# Patient Record
Sex: Male | Born: 1965 | Race: Black or African American | Hispanic: No | Marital: Married | Smoking: Never smoker
Health system: Southern US, Community
[De-identification: ages and names within clinical notes are randomized; demographics above are authoritative.]

## PROBLEM LIST (undated history)

## (undated) DIAGNOSIS — G609 Hereditary and idiopathic neuropathy, unspecified: Principal | ICD-10-CM

## (undated) DIAGNOSIS — M21619 Bunion of unspecified foot: Secondary | ICD-10-CM

## (undated) DIAGNOSIS — R351 Nocturia: Secondary | ICD-10-CM

## (undated) DIAGNOSIS — C61 Malignant neoplasm of prostate: Secondary | ICD-10-CM

## (undated) DIAGNOSIS — D860 Sarcoidosis of lung: Secondary | ICD-10-CM

## (undated) DIAGNOSIS — Z9189 Other specified personal risk factors, not elsewhere classified: Secondary | ICD-10-CM

## (undated) DIAGNOSIS — I10 Essential (primary) hypertension: Secondary | ICD-10-CM

## (undated) DIAGNOSIS — E785 Hyperlipidemia, unspecified: Secondary | ICD-10-CM

## (undated) HISTORY — DX: Malignant neoplasm of prostate: C61

## (undated) HISTORY — DX: Essential (primary) hypertension: I10

## (undated) HISTORY — PX: INGUINAL HERNIA REPAIR: SUR1180

## (undated) HISTORY — DX: Hyperlipidemia, unspecified: E78.5

## (undated) HISTORY — PX: FOOT SURGERY: SHX648

## (undated) HISTORY — PX: BUNIONECTOMY: SHX129

## (undated) HISTORY — DX: Hereditary and idiopathic neuropathy, unspecified: G60.9

---

## 1988-11-01 HISTORY — PX: BRONCHOSCOPY: SUR163

## 1999-01-21 ENCOUNTER — Encounter: Admission: RE | Admit: 1999-01-21 | Discharge: 1999-01-21 | Payer: Self-pay | Admitting: Family Medicine

## 1999-01-21 ENCOUNTER — Encounter: Payer: Self-pay | Admitting: Family Medicine

## 1999-10-04 ENCOUNTER — Emergency Department (HOSPITAL_COMMUNITY): Admission: EM | Admit: 1999-10-04 | Discharge: 1999-10-04 | Payer: Self-pay | Admitting: Emergency Medicine

## 2003-02-09 ENCOUNTER — Encounter: Admission: RE | Admit: 2003-02-09 | Discharge: 2003-02-09 | Payer: Self-pay | Admitting: Family Medicine

## 2003-12-09 ENCOUNTER — Emergency Department (HOSPITAL_COMMUNITY): Admission: EM | Admit: 2003-12-09 | Discharge: 2003-12-09 | Payer: Self-pay | Admitting: Emergency Medicine

## 2005-04-28 ENCOUNTER — Encounter: Admission: RE | Admit: 2005-04-28 | Discharge: 2005-04-28 | Payer: Self-pay | Admitting: Family Medicine

## 2010-10-09 ENCOUNTER — Ambulatory Visit
Admission: RE | Admit: 2010-10-09 | Discharge: 2010-10-09 | Disposition: A | Discharge status: Home / Self Care | Payer: 59 | Source: Ambulatory Visit | Attending: Family Medicine | Admitting: Family Medicine

## 2010-10-09 ENCOUNTER — Other Ambulatory Visit: Payer: Self-pay | Admitting: Family Medicine

## 2010-10-09 DIAGNOSIS — R200 Anesthesia of skin: Secondary | ICD-10-CM

## 2010-10-09 DIAGNOSIS — M545 Low back pain, unspecified: Secondary | ICD-10-CM

## 2010-10-24 ENCOUNTER — Other Ambulatory Visit: Payer: Self-pay | Admitting: Family Medicine

## 2010-10-24 DIAGNOSIS — M545 Low back pain, unspecified: Secondary | ICD-10-CM

## 2010-10-29 ENCOUNTER — Ambulatory Visit
Admission: RE | Admit: 2010-10-29 | Discharge: 2010-10-29 | Disposition: A | Discharge status: Home / Self Care | Payer: 59 | Source: Ambulatory Visit | Attending: Family Medicine | Admitting: Family Medicine

## 2010-10-29 DIAGNOSIS — M545 Low back pain, unspecified: Secondary | ICD-10-CM

## 2010-11-10 ENCOUNTER — Other Ambulatory Visit: Payer: Self-pay | Admitting: Family Medicine

## 2010-11-10 DIAGNOSIS — M48061 Spinal stenosis, lumbar region without neurogenic claudication: Secondary | ICD-10-CM

## 2010-11-12 ENCOUNTER — Ambulatory Visit
Admission: RE | Admit: 2010-11-12 | Discharge: 2010-11-12 | Disposition: A | Discharge status: Home / Self Care | Payer: 59 | Source: Ambulatory Visit | Attending: Family Medicine | Admitting: Family Medicine

## 2010-11-12 ENCOUNTER — Other Ambulatory Visit: Payer: Self-pay | Admitting: Family Medicine

## 2010-11-12 DIAGNOSIS — M48061 Spinal stenosis, lumbar region without neurogenic claudication: Secondary | ICD-10-CM

## 2010-11-12 MED ORDER — METHYLPREDNISOLONE ACETATE 40 MG/ML INJ SUSP (RADIOLOG
120.0000 mg | Freq: Once | INTRAMUSCULAR | Status: AC
  Administered 2010-11-12: 120 mg via EPIDURAL

## 2010-11-12 MED ORDER — IOHEXOL 180 MG/ML  SOLN
1.0000 mL | Freq: Once | INTRAMUSCULAR | Status: AC | PRN
  Administered 2010-11-12: 1 mL via EPIDURAL

## 2011-04-21 ENCOUNTER — Other Ambulatory Visit (HOSPITAL_COMMUNITY): Payer: Self-pay | Admitting: Neurosurgery

## 2011-04-21 ENCOUNTER — Other Ambulatory Visit: Payer: Self-pay | Admitting: Neurosurgery

## 2011-04-21 DIAGNOSIS — M5416 Radiculopathy, lumbar region: Secondary | ICD-10-CM

## 2011-05-14 ENCOUNTER — Ambulatory Visit (HOSPITAL_COMMUNITY)
Admission: RE | Admit: 2011-05-14 | Discharge: 2011-05-14 | Disposition: A | Discharge status: Home / Self Care | Payer: 59 | Source: Ambulatory Visit | Attending: Neurosurgery | Admitting: Neurosurgery

## 2011-05-14 ENCOUNTER — Other Ambulatory Visit (HOSPITAL_COMMUNITY): Payer: 59

## 2011-05-14 DIAGNOSIS — M5126 Other intervertebral disc displacement, lumbar region: Secondary | ICD-10-CM | POA: Insufficient documentation

## 2011-05-14 DIAGNOSIS — M5416 Radiculopathy, lumbar region: Secondary | ICD-10-CM

## 2011-05-14 MED ORDER — OXYCODONE-ACETAMINOPHEN 5-325 MG PO TABS: 1.0000 | ORAL_TABLET | ORAL | Status: DC | PRN

## 2011-05-14 MED ORDER — IOHEXOL 180 MG/ML  SOLN
20.0000 mL | Freq: Once | INTRAMUSCULAR | Status: AC | PRN
  Administered 2011-05-14: 20 mL via INTRATHECAL

## 2011-05-14 MED ORDER — DIAZEPAM 5 MG PO TABS
ORAL_TABLET | ORAL | Status: AC
  Filled 2011-05-14: qty 2

## 2011-05-14 MED ORDER — DIAZEPAM 5 MG PO TABS
10.0000 mg | ORAL_TABLET | Freq: Once | ORAL | Status: AC
  Administered 2011-05-14: 10 mg via ORAL

## 2011-05-14 MED ORDER — ONDANSETRON HCL 4 MG/2ML IJ SOLN: 4.0000 mg | Freq: Four times a day (QID) | INTRAMUSCULAR | Status: DC | PRN

## 2011-05-14 NOTE — Discharge Instructions (Signed)
Myelogram and Lumbar Puncture Discharge Instructions  1. Go home and rest quietly for the next 24 hours.  It is important to lie flat for the next 24 hours.  Get up only to go to the restroom.  You may lie in the bed or on a couch on your back, your stomach, your left side or your right side.  You may have one pillow under your head.  You may have pillows between your knees while you are on your side or under your knees while you are on your back.  2. DO NOT drive today.  Recline the seat as far back as it will go, while still wearing your seat belt, on the way home.  3. You may get up to go to the bathroom as needed.  You may sit up for 10 minutes to eat.  You may resume your normal diet and medications unless otherwise indicated.  4. The incidence of headache, nausea, or vomiting is about 5% (one in 20 patients).  If you develop a headache, lie flat and drink plenty of fluids until the headache goes away.  Caffeinated beverages may be helpful.  If you develop severe nausea and vomiting or a headache that does not go away with flat bed rest, call MD.  5. You may resume normal activities after your 24 hours of bed rest is over; however, do not exert yourself strongly or do any heavy lifting tomorrow.  6. Call your physician for a follow-up appointment.  The results of your myelogram will be sent directly to your physician by the following day.  7. If you have any questions or if complications develop after you arrive home, please call MD.  Discharge instructions have been explained to the patient.  The patient, or the person responsible for the patient, fully understands these instructions.   

## 2011-05-14 NOTE — H&P (Signed)
Martin Murphy is an 46 y.o. male.   Chief Complaint: back and right lower extremity pain HPI: back and right lower extremity pain.  No past medical history on file.  No past surgical history on file.  No family history on file. Social History:  does not have a smoking history on file. He does not have any smokeless tobacco history on file. His alcohol and drug histories not on file.  Allergies: No Known Allergies  No current outpatient prescriptions on file as of 05/14/2011.   Medications Prior to Admission  Medication Dose Route Frequency Provider Last Rate Last Dose  . diazepam (VALIUM) 5 MG tablet           . diazepam (VALIUM) tablet 10 mg  10 mg Oral Once Carmela Hurt, MD   10 mg at 05/14/11 0707    No results found for this or any previous visit (from the past 48 hour(s)). No results found.  ROS  Blood pressure 137/84, pulse 78, temperature 96.8 F (36 C), temperature source Oral, resp. rate 18, height 5\' 11"  (1.803 m), weight 92.08 kg (203 lb), SpO2 96.00%. Physical Exam Alert and oriented x4 5/5 all extremities  Assessment/Plan Myelogram for evaluation of pain  Johan Creveling L 05/14/2011, 8:35 AM

## 2011-05-14 NOTE — Op Note (Signed)
*   No surgery found *  8:45 AM  PATIENT:  Martin Murphy  46 y.o. malewith back and right lower extremity pain  PRE-OPERATIVE DIAGNOSIS:  Right lower extremity pain  POST-OPERATIVE DIAGNOSIS:  Right lower extremity pain  PROCEDURE:  Lumbar myelogram  SURGEON:Ravenne Wayment    DICTATION: [name] back was prepped and draped in a sterile manner. I infiltrated lidocaine into the lumbar area. I placed a spinal needle into the thecal sac between L3/4. I infiltrated 200 180 omnipaque into the thecal sac. Xray showed the dye to be in the thecal sac.   PLAN OF CARE: Discharge to home after PACU  PATIENT DISPOSITION:  PACU - hemodynamically stable.   Delay start of Pharmacological VTE agent (>24hrs) due to surgical blood loss or risk of bleeding:  yes

## 2011-05-16 ENCOUNTER — Telehealth (HOSPITAL_COMMUNITY): Payer: Self-pay

## 2011-05-16 ENCOUNTER — Other Ambulatory Visit: Payer: Self-pay | Admitting: Neurosurgery

## 2011-05-16 ENCOUNTER — Ambulatory Visit
Admission: RE | Admit: 2011-05-16 | Discharge: 2011-05-16 | Disposition: A | Discharge status: Home / Self Care | Payer: 59 | Source: Ambulatory Visit | Attending: Neurosurgery | Admitting: Neurosurgery

## 2011-05-16 MED ORDER — IOHEXOL 180 MG/ML  SOLN
1.0000 mL | Freq: Once | INTRAMUSCULAR | Status: AC | PRN
  Administered 2011-05-16: 1 mL via EPIDURAL

## 2011-05-16 NOTE — Progress Notes (Signed)
20 cc of blood drawn for the blood patch, from pt's left cephalic vein.  Site unremarkable and pt tolerated procedure well.

## 2011-05-16 NOTE — Discharge Instructions (Signed)
Blood Patch Discharge Instructions  1. Go home and rest quietly for the next 24 hours.  It is important to lie flat for the next 24 hours.  Get up only to go to the restroom.  You may lie in the bed or on a couch on your back, your stomach, your left side or your right side.  You may have one pillow under your head.  You may have pillows between your knees while you are on your side or under your knees while you are on your back.  2. DO NOT drive today.  Recline the seat as far back as it will go, while still wearing your seat belt, on the way home.  3. You may get up to go to the bathroom as needed.  You may sit up for 10 minutes to eat.  You may resume your normal diet and medications unless otherwise indicated.  Drink lots of extra fluids today and tomorrow.  4. The incidence of headache, nausea, or vomiting is about 5% (one in 20 patients).  If you develop a headache, lie flat and drink plenty of fluids until the headache goes away.  Caffeinated beverages may be helpful.  If you develop severe nausea and vomiting or a headache that does not go away with flat bed rest, call [phone].  5. You may resume normal activities after your 24 hours of bed rest is over; however, do not exert yourself strongly or do any heavy lifting tomorrow.  6. Call your physician for a follow-up appointment.   7. If you have any questions or if complications develop after you arrive home, please call [phone].  Discharge instructions have been explained to the patient.  The patient, or the person responsible for the patient, fully understands these instructions.  

## 2012-01-01 ENCOUNTER — Encounter (INDEPENDENT_AMBULATORY_CARE_PROVIDER_SITE_OTHER): Payer: Self-pay

## 2012-01-12 ENCOUNTER — Encounter (INDEPENDENT_AMBULATORY_CARE_PROVIDER_SITE_OTHER): Payer: Self-pay | Admitting: General Surgery

## 2012-01-15 ENCOUNTER — Encounter (INDEPENDENT_AMBULATORY_CARE_PROVIDER_SITE_OTHER): Payer: Self-pay | Admitting: General Surgery

## 2012-01-15 ENCOUNTER — Ambulatory Visit (INDEPENDENT_AMBULATORY_CARE_PROVIDER_SITE_OTHER): Payer: 59 | Admitting: General Surgery

## 2012-01-15 VITALS — BP 123/79 | HR 82 | Temp 98.6°F | Resp 16 | Ht 71.0 in | Wt 206.2 lb

## 2012-01-15 DIAGNOSIS — K4090 Unilateral inguinal hernia, without obstruction or gangrene, not specified as recurrent: Secondary | ICD-10-CM

## 2012-01-15 NOTE — Progress Notes (Signed)
Subjective:     Patient ID: Martin Murphy, male   DOB: 1966-02-16, 46 y.o.   MRN: [id number]  HPI The patient is a 46 year old male with a several week history of a right inguinal hernia. Patient had increased burning sensation and a bulge that he can feel after being on his feet for a long period of time.  Patient's had previous lifting or hernia repair in the 80s.  Review of Systems  Constitutional: Negative.   HENT: Negative.   Eyes: Negative.   Respiratory: Negative.   Cardiovascular: Negative.   Gastrointestinal: Negative.   Musculoskeletal: Negative.   Neurological: Negative.        Objective:   Physical Exam  Constitutional: He is oriented to person, place, and time. He appears well-developed and well-nourished.  HENT:  Head: Normocephalic and atraumatic.  Eyes: Conjunctivae normal and EOM are normal. Pupils are equal, round, and reactive to light.  Neck: Normal range of motion. Neck supple.  Cardiovascular: Normal rate and regular rhythm.   Pulmonary/Chest: Effort normal and breath sounds normal.  Abdominal: Soft. Bowel sounds are normal. A hernia is present. Hernia confirmed positive in the right inguinal area.  Neurological: He is alert and oriented to person, place, and time. He has normal reflexes.       Assessment:     The patient is a 46 year old male with a right inguinal hernia    Plan:     1. We'll proceed to the operating room for laparoscopic right inguinal hernia repair with mesh.  2.All risks and benefits were discussed with the patient, to generally include infection, bleeding, damage to surrounding structures, and recurrence. Alternatives were offered and described.  All questions were answered and the patient voiced understanding of the procedure and wishes to proceed at this point.

## 2012-02-09 DIAGNOSIS — K4090 Unilateral inguinal hernia, without obstruction or gangrene, not specified as recurrent: Secondary | ICD-10-CM

## 2012-02-24 ENCOUNTER — Encounter (INDEPENDENT_AMBULATORY_CARE_PROVIDER_SITE_OTHER): Payer: 59 | Admitting: General Surgery

## 2012-02-24 ENCOUNTER — Ambulatory Visit (INDEPENDENT_AMBULATORY_CARE_PROVIDER_SITE_OTHER): Payer: 59 | Admitting: General Surgery

## 2012-02-24 ENCOUNTER — Encounter (INDEPENDENT_AMBULATORY_CARE_PROVIDER_SITE_OTHER): Payer: Self-pay | Admitting: General Surgery

## 2012-02-24 VITALS — BP 132/84 | HR 76 | Temp 97.8°F | Resp 16 | Ht 71.0 in | Wt 199.5 lb

## 2012-02-24 DIAGNOSIS — Z9889 Other specified postprocedural states: Secondary | ICD-10-CM

## 2012-02-24 DIAGNOSIS — Z8719 Personal history of other diseases of the digestive system: Secondary | ICD-10-CM

## 2012-02-24 NOTE — Progress Notes (Signed)
Patient ID: Martin Murphy, male   DOB: 05/13/1965, 46 y.o.   MRN: [id number] Pt doing well no complaints today.  Some scrotal edema.  O/E Wound c/d/i No hernia on palp  A/p: F/u x 1 mo No heavy lifting,.

## 2012-02-26 ENCOUNTER — Encounter (INDEPENDENT_AMBULATORY_CARE_PROVIDER_SITE_OTHER): Payer: 59 | Admitting: General Surgery

## 2012-03-24 ENCOUNTER — Ambulatory Visit (INDEPENDENT_AMBULATORY_CARE_PROVIDER_SITE_OTHER): Payer: 59 | Admitting: General Surgery

## 2012-03-24 ENCOUNTER — Encounter (INDEPENDENT_AMBULATORY_CARE_PROVIDER_SITE_OTHER): Payer: Self-pay | Admitting: General Surgery

## 2012-03-24 VITALS — BP 138/80 | HR 80 | Resp 16 | Ht 71.0 in | Wt 204.2 lb

## 2012-03-24 DIAGNOSIS — Z9889 Other specified postprocedural states: Secondary | ICD-10-CM

## 2012-03-24 DIAGNOSIS — Z8719 Personal history of other diseases of the digestive system: Secondary | ICD-10-CM

## 2012-03-24 NOTE — Progress Notes (Signed)
Patient ID: Martin Murphy, male   DOB: 06/12/1965, 47 y.o.   MRN: [id number] Patient is a 47 year old male status post laparoscopic right inguinal hernia repair with mesh. Patient has been doing well postoperatively. His previous edema in his right scrotal area has slowly resolving.  On exam: No hernia that can be  palpate. Scrotal edema his results.  Assessment and plan: 1. There is to return to work at full duty.  2. Is to followup when necessary

## 2012-03-29 ENCOUNTER — Encounter (INDEPENDENT_AMBULATORY_CARE_PROVIDER_SITE_OTHER): Payer: Self-pay | Admitting: General Surgery

## 2012-03-29 ENCOUNTER — Telehealth (INDEPENDENT_AMBULATORY_CARE_PROVIDER_SITE_OTHER): Payer: Self-pay

## 2012-03-29 ENCOUNTER — Telehealth (INDEPENDENT_AMBULATORY_CARE_PROVIDER_SITE_OTHER): Payer: Self-pay | Admitting: General Surgery

## 2012-03-29 NOTE — Telephone Encounter (Signed)
Faxed RTW note for full duty as of 03/29/12 to [phone] per req.Marland Kitchen

## 2012-03-29 NOTE — Telephone Encounter (Signed)
The pt's employer called and they need a note for him to return to work full duty with the date on it.  Please fax to [phone]

## 2012-07-01 ENCOUNTER — Other Ambulatory Visit: Payer: Self-pay | Admitting: Family Medicine

## 2012-07-06 ENCOUNTER — Ambulatory Visit
Admission: RE | Admit: 2012-07-06 | Discharge: 2012-07-06 | Disposition: A | Discharge status: Home / Self Care | Payer: 59 | Source: Ambulatory Visit | Attending: Family Medicine | Admitting: Family Medicine

## 2012-12-20 HISTORY — PX: PROSTATE BIOPSY: SHX241

## 2013-05-19 ENCOUNTER — Telehealth: Payer: Self-pay | Admitting: Internal Medicine

## 2013-05-19 NOTE — Telephone Encounter (Signed)
S/W PATIENT AND GAVE NEW PATIENT APPT FOR 03/31 @ 1:30 W/DR. MOHAMED.  [place] PACKET MAILED.

## 2013-05-20 ENCOUNTER — Telehealth: Payer: Self-pay | Admitting: Internal Medicine

## 2013-05-20 NOTE — Telephone Encounter (Signed)
C/D 05/20/13 for appt. 05/31/13

## 2013-05-31 ENCOUNTER — Ambulatory Visit: Payer: 59

## 2013-05-31 ENCOUNTER — Encounter: Payer: Self-pay | Admitting: Internal Medicine

## 2013-05-31 ENCOUNTER — Telehealth: Payer: Self-pay | Admitting: Internal Medicine

## 2013-05-31 ENCOUNTER — Other Ambulatory Visit (HOSPITAL_BASED_OUTPATIENT_CLINIC_OR_DEPARTMENT_OTHER): Payer: 59

## 2013-05-31 ENCOUNTER — Ambulatory Visit (HOSPITAL_BASED_OUTPATIENT_CLINIC_OR_DEPARTMENT_OTHER): Payer: 59 | Admitting: Internal Medicine

## 2013-05-31 VITALS — BP 137/80 | HR 88 | Temp 98.4°F | Resp 20 | Ht 71.0 in | Wt 198.8 lb

## 2013-05-31 DIAGNOSIS — D472 Monoclonal gammopathy: Secondary | ICD-10-CM

## 2013-05-31 DIAGNOSIS — Z8546 Personal history of malignant neoplasm of prostate: Secondary | ICD-10-CM

## 2013-05-31 DIAGNOSIS — M79609 Pain in unspecified limb: Secondary | ICD-10-CM

## 2013-05-31 LAB — CBC WITH DIFFERENTIAL/PLATELET
BASO%: 0.3 % (ref 0.0–2.0)
Basophils Absolute: 0 10*3/uL (ref 0.0–0.1)
EOS%: 2.4 % (ref 0.0–7.0)
Eosinophils Absolute: 0.1 10*3/uL (ref 0.0–0.5)
HCT: 38.8 % (ref 38.4–49.9)
HGB: 12.7 g/dL — ABNORMAL LOW (ref 13.0–17.1)
LYMPH#: 1.1 10*3/uL (ref 0.9–3.3)
LYMPH%: 19.5 % (ref 14.0–49.0)
MCH: 27.5 pg (ref 27.2–33.4)
MCHC: 32.8 g/dL (ref 32.0–36.0)
MCV: 83.9 fL (ref 79.3–98.0)
MONO#: 0.5 10*3/uL (ref 0.1–0.9)
MONO%: 8.6 % (ref 0.0–14.0)
NEUT#: 3.8 10*3/uL (ref 1.5–6.5)
NEUT%: 69.2 % (ref 39.0–75.0)
Platelets: 230 10*3/uL (ref 140–400)
RBC: 4.62 10*6/uL (ref 4.20–5.82)
RDW: 14.4 % (ref 11.0–14.6)
WBC: 5.5 10*3/uL (ref 4.0–10.3)

## 2013-05-31 LAB — COMPREHENSIVE METABOLIC PANEL (CC13)
ALK PHOS: 280 U/L — AB (ref 40–150)
ALT: 60 U/L — AB (ref 0–55)
AST: 49 U/L — AB (ref 5–34)
Albumin: 3.4 g/dL — ABNORMAL LOW (ref 3.5–5.0)
Anion Gap: 12 mEq/L — ABNORMAL HIGH (ref 3–11)
BUN: 13.4 mg/dL (ref 7.0–26.0)
CALCIUM: 9.8 mg/dL (ref 8.4–10.4)
CHLORIDE: 101 meq/L (ref 98–109)
CO2: 26 mEq/L (ref 22–29)
CREATININE: 0.9 mg/dL (ref 0.7–1.3)
Glucose: 103 mg/dl (ref 70–140)
POTASSIUM: 3.4 meq/L — AB (ref 3.5–5.1)
Sodium: 139 mEq/L (ref 136–145)
Total Bilirubin: 0.45 mg/dL (ref 0.20–1.20)
Total Protein: 8.2 g/dL (ref 6.4–8.3)

## 2013-05-31 LAB — LACTATE DEHYDROGENASE (CC13): LDH: 141 U/L (ref 125–245)

## 2013-05-31 NOTE — Progress Notes (Signed)
Checked in new pt with no financial concerns. °

## 2013-05-31 NOTE — Progress Notes (Signed)
[facility] [phone]   Fax:([phone]) [phone]  CONSULT NOTE  REFERRING PHYSICIAN: Dr. Tobie Lords  REASON FOR CONSULTATION:  48 years old African American male with M spike  HPI Martin Murphy is a 48 y.o. male was past medical history significant for hypertension, prostate cancer diagnosed in October of 2014 as well as biopsy-proven sarcoidosis. The patient has been complaining of increasing pain in his legs and feet as well as lower back. He received a steroid injection in the back with no improvement in his condition. He was referred to Dr. Ouida Sills for evaluation of his condition. During his assessment serum protein electrophoreses was performed and showed M spike of 0.2 G/DL. Urine protein electrophoreses was also performed that showed no detectable M spike. Because of these abnormalities the patient was referred to me today for further evaluation and recommendation regarding the monoclonal gammopathy.  The patient is feeling fine today with no specific complaints except for the persistent aching pain in his feet. He also lost around 8 pounds over the last 6 months. He denied having any significant chest pain, shortness breath, cough or hemoptysis. He has no nausea or vomiting, no diarrhea or constipation and no bleeding issues. He was recently diagnosed with prostate adenocarcinoma in October of 2014 by Dr. Risa Grill after he was found to have rising PSA. He scheduled for repeat biopsy in the next few weeks. Family history significant for a mother with a bone marrow disorder and father died from natural causes at age 74. The patient is divorced and has one son. 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 He has no history of smoking, alcohol or drug abuse. HPI  Past Medical History  Diagnosis Date  . Hypertension   . Sarcoid   . Right inguinal hernia     Past Surgical History  Procedure Laterality Date  . Sarcoid      neck  .  Hernia repair  1993  . Hernia repair  02/09/12    right inguinal repair with mesh    History reviewed. No pertinent family history.  Social History History  Substance Use Topics  . Smoking status: Never Smoker   . Smokeless tobacco: Never Used  . Alcohol Use: No    No Known Allergies  Current Outpatient Prescriptions  Medication Sig Dispense Refill  . hydrochlorothiazide (HYDRODIURIL) 25 MG tablet Take 25 mg by mouth daily.      . Multiple Vitamin (MULTIVITAMIN) tablet Take 1 tablet by mouth 2 (two) times daily.       No current facility-administered medications for this visit.    Review of Systems  Constitutional: positive for fatigue and weight loss Eyes: negative Ears, nose, mouth, throat, and face: negative Respiratory: negative Cardiovascular: negative Gastrointestinal: negative Genitourinary:negative Integument/breast: negative Hematologic/lymphatic: negative Musculoskeletal:positive for myalgias Neurological: negative Behavioral/Psych: negative Endocrine: negative Allergic/Immunologic: negative  Physical Exam  KGO:VPCHE, healthy, no distress, well nourished and well developed SKIN: skin color, texture, turgor are normal, no rashes or significant lesions HEAD: Normocephalic, No masses, lesions, tenderness or abnormalities EYES: normal, PERRLA EARS: External ears normal, Canals clear OROPHARYNX:no exudate, no erythema and lips, buccal mucosa, and tongue normal  NECK: supple, no adenopathy, no JVD LYMPH:  no palpable lymphadenopathy, no hepatosplenomegaly LUNGS: clear to auscultation , and palpation HEART: regular rate & rhythm and no murmurs ABDOMEN:abdomen soft, non-tender, normal bowel sounds and no masses or organomegaly BACK: Back symmetric, no curvature., No CVA tenderness EXTREMITIES:no joint deformities, effusion, or inflammation,  no edema, no skin discoloration, no clubbing  NEURO: alert & oriented x 3 with fluent speech, no focal motor/sensory  deficits  PERFORMANCE STATUS: ECOG 1  LABORATORY DATA: No results found for this basename: WBC, HGB, HCT, MCV, PLT      Chemistry   No results found for this basename: NA, K, CL, CO2, BUN, CREATININE, GLU   No results found for this basename: CALCIUM, ALKPHOS, AST, ALT, BILITOT       RADIOGRAPHIC STUDIES: No results found.  ASSESSMENT: This is a very pleasant 48 years old African American male has been complaining of aching pain especially in the legs and feet and recent blood work showed slightly elevated M spike in the serum protein electrophoresis. This could be reactive in nature but I cannot rule out monoclonal gammopathy or plasma cell dyscrasia at this point.   PLAN:  I had a lengthy discussion with the patient today about his condition. I ordered severa monoclonal gammopathyl studies to evaluate his elevated M spike. I will repeat CBC, comprehensive metabolic panel, LDH as well as myeloma panel. This lab results are still pending. I would see the patient back for followup visit in 2 weeks for reevaluation and discussion of his lab results. If there is any concerning findings I would consider the patient for a bone marrow biopsy and aspirate for confirmation of his diagnosis. The patient is currently asymptomatic except for the aching pain. He will continue on observation for now.  he was advised to call immediately if he has any concerning symptoms in the interval.  The patient voices understanding of current disease status and treatment options and is in agreement with the current care plan.  All questions were answered. The patient knows to call the clinic with any problems, questions or concerns. We can certainly see the patient much sooner if necessary.  Thank you so much for allowing me to participate in the care of Martin Murphy. I will continue to follow up the patient with you and assist in his care.  I spent 35 minutes counseling the patient face to face. The total  time spent in the appointment was 55 minutes.  Disclaimer: This note was dictated with voice recognition software. Similar sounding words can inadvertently be transcribed and may not be corrected upon review.   Zanobia Griebel K. 05/31/2013, 2:49 PM

## 2013-05-31 NOTE — Telephone Encounter (Signed)
gve the pt his April 2105 appt calendar

## 2013-06-02 LAB — KAPPA/LAMBDA LIGHT CHAINS
KAPPA FREE LGHT CHN: 2.48 mg/dL — AB (ref 0.33–1.94)
KAPPA LAMBDA RATIO: 0.87 (ref 0.26–1.65)
Lambda Free Lght Chn: 2.84 mg/dL — ABNORMAL HIGH (ref 0.57–2.63)

## 2013-06-02 LAB — IGG, IGA, IGM
IGA: 705 mg/dL — AB (ref 68–379)
IGG (IMMUNOGLOBIN G), SERUM: 1600 mg/dL (ref 650–1600)
IgM, Serum: 30 mg/dL — ABNORMAL LOW (ref 41–251)

## 2013-06-02 LAB — BETA 2 MICROGLOBULIN, SERUM: BETA 2 MICROGLOBULIN: 3.61 mg/L — AB (ref <=2.51)

## 2013-06-07 HISTORY — PX: PROSTATE BIOPSY: SHX241

## 2013-06-20 ENCOUNTER — Telehealth: Payer: Self-pay | Admitting: Internal Medicine

## 2013-06-20 ENCOUNTER — Ambulatory Visit (HOSPITAL_BASED_OUTPATIENT_CLINIC_OR_DEPARTMENT_OTHER): Payer: 59 | Admitting: Internal Medicine

## 2013-06-20 ENCOUNTER — Encounter: Payer: Self-pay | Admitting: Internal Medicine

## 2013-06-20 DIAGNOSIS — D472 Monoclonal gammopathy: Secondary | ICD-10-CM

## 2013-06-20 NOTE — Telephone Encounter (Signed)
gv adn printed appt sched and avs for pt for OCT °

## 2013-06-20 NOTE — Progress Notes (Signed)
[facility] [phone]   Fax:(336) [phone]  OFFICE PROGRESS NOTE  Simona Huh, MD [address], [address]  [place] [address]  DIAGNOSIS: Monoclonal gammopathy of undetermined significance  PRIOR THERAPY: None  CURRENT THERAPY: Observation.   INTERVAL HISTORY: Martin Murphy 48 y.o. male returns to the clinic today for followup visit. The patient is feeling fine today with no specific complaints except for pain in his feet. He denied having any significant chest pain, shortness of breath, cough or hemoptysis. The patient denied having any nausea or vomiting. He has no significant weight loss or night sweats. He has several studies performed recently including myeloma panel and he is here for evaluation and discussion of his lab results.  MEDICAL HISTORY: Past Medical History  Diagnosis Date  . Hypertension   . Sarcoid   . Right inguinal hernia     ALLERGIES:  has No Known Allergies.  MEDICATIONS:  Current Outpatient Prescriptions  Medication Sig Dispense Refill  . hydrochlorothiazide (HYDRODIURIL) 25 MG tablet Take 25 mg by mouth daily.      . Multiple Vitamin (MULTIVITAMIN) tablet Take 1 tablet by mouth 2 (two) times daily.       No current facility-administered medications for this visit.    SURGICAL HISTORY:  Past Surgical History  Procedure Laterality Date  . Sarcoid      neck  . Hernia repair  1993  . Hernia repair  02/09/12    right inguinal repair with mesh    REVIEW OF SYSTEMS:  A comprehensive review of systems was negative except for: Pain in his feet   PHYSICAL EXAMINATION: General appearance: alert, cooperative and no distress Head: Normocephalic, without obvious abnormality, atraumatic Neck: no adenopathy, no JVD, supple, symmetrical, trachea midline and thyroid not enlarged, symmetric, no tenderness/mass/nodules Lymph nodes: Cervical, supraclavicular, and axillary nodes normal. Resp: clear to auscultation  bilaterally Back: symmetric, no curvature. ROM normal. No CVA tenderness. Cardio: regular rate and rhythm, S1, S2 normal, no murmur, click, rub or gallop GI: soft, non-tender; bowel sounds normal; no masses,  no organomegaly Extremities: extremities normal, atraumatic, no cyanosis or edema  ECOG PERFORMANCE STATUS: 0 - Asymptomatic  There were no vitals taken for this visit.  LABORATORY DATA: Lab Results  Component Value Date   WBC 5.5 05/31/2013   HGB 12.7* 05/31/2013   HCT 38.8 05/31/2013   MCV 83.9 05/31/2013   PLT 230 05/31/2013      Chemistry      Component Value Date/Time   NA 139 05/31/2013 1533   K 3.4* 05/31/2013 1533   CO2 26 05/31/2013 1533   BUN 13.4 05/31/2013 1533   CREATININE 0.9 05/31/2013 1533      Component Value Date/Time   CALCIUM 9.8 05/31/2013 1533   ALKPHOS 280* 05/31/2013 1533   AST 49* 05/31/2013 1533   ALT 60* 05/31/2013 1533   BILITOT 0.45 05/31/2013 1533     Other lab results: Beta-2 microglobulin 3.61, IgG 1600, IgA 705 and IgM 30. Kappa light chain 2.48, free lambda light chain 2.84 with a kappa/lambda ratio of 0.87.  RADIOGRAPHIC STUDIES: No results found.  ASSESSMENT AND PLAN: This is a very pleasant 48 years old [place] American male with minor elevation of IgA most likely inflammatory in origin but I cannot rule out monoclonal gammopathy of undetermined significance. I discussed the lab result with the patient today and give him the option of continuous observation and close monitoring versus proceeding with a bone  marrow biopsy and aspirate to rule out plasma cell neoplasm. He would like to continue on observation for now. I would see him back for followup visit in 6 months with repeat myeloma panel. He was advised to call immediately if he has any concerning symptoms in the interval. The patient voices understanding of current disease status and treatment options and is in agreement with the current care plan.  All questions were answered. The  patient knows to call the clinic with any problems, questions or concerns. We can certainly see the patient much sooner if necessary.  Disclaimer: This note was dictated with voice recognition software. Similar sounding words can inadvertently be transcribed and may not be corrected upon review.

## 2013-07-28 ENCOUNTER — Encounter: Payer: Self-pay | Admitting: *Deleted

## 2013-07-29 ENCOUNTER — Encounter: Payer: Self-pay | Admitting: Radiation Oncology

## 2013-07-29 NOTE — Progress Notes (Signed)
GU Location of Tumor / Histology: prostate  If Prostate Cancer, Gleason Score is (3 + 3) and PSA is (4.51 on 05/10/13)  Patient presented 7 months ago with signs/symptoms of: positive prostate biopsy secondary to increase in PSA from 1.0 to 2.8, strong family history of prostate cancer  Biopsies of prostate (if applicable) revealed: 03/09/38  12/20/12    Past/Anticipated interventions by urology, if any: repeat biopsy  Past/Anticipated interventions by medical oncology, if any: none  Weight changes, if any: lost 10-15 lbs over past year he attributes to stress of diagnosis and divorce  Bowel/Bladder complaints, if any:  Urinary frequency, urgency, nocturia x 2 , IPSS 7  Nausea/Vomiting, if any: no  Pain issues, if any:  Both feet, due to "bunions", pt started Lodine today  SAFETY ISSUES:  Prior radiation? no  Pacemaker/ICD? no  Possible current pregnancy? na  Is the patient on methotrexate? 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

## 2013-08-02 ENCOUNTER — Encounter: Payer: Self-pay | Admitting: Radiation Oncology

## 2013-08-02 ENCOUNTER — Ambulatory Visit
Admission: RE | Admit: 2013-08-02 | Discharge: 2013-08-02 | Disposition: A | Discharge status: Home / Self Care | Payer: 59 | Source: Ambulatory Visit | Attending: Radiation Oncology | Admitting: Radiation Oncology

## 2013-08-02 ENCOUNTER — Encounter: Payer: Self-pay | Admitting: *Deleted

## 2013-08-02 VITALS — BP 116/76 | HR 84 | Temp 98.9°F | Resp 20 | Ht 71.0 in | Wt 203.8 lb

## 2013-08-02 DIAGNOSIS — I10 Essential (primary) hypertension: Secondary | ICD-10-CM | POA: Insufficient documentation

## 2013-08-02 DIAGNOSIS — Z510 Encounter for antineoplastic radiation therapy: Secondary | ICD-10-CM | POA: Diagnosis not present

## 2013-08-02 DIAGNOSIS — Z791 Long term (current) use of non-steroidal anti-inflammatories (NSAID): Secondary | ICD-10-CM | POA: Insufficient documentation

## 2013-08-02 DIAGNOSIS — C61 Malignant neoplasm of prostate: Secondary | ICD-10-CM | POA: Insufficient documentation

## 2013-08-02 DIAGNOSIS — Z8042 Family history of malignant neoplasm of prostate: Secondary | ICD-10-CM | POA: Insufficient documentation

## 2013-08-02 NOTE — Progress Notes (Signed)
Please see the Nurse Progress Note in the MD Initial Consult Encounter for this patient. 

## 2013-08-02 NOTE — Progress Notes (Signed)
[facility] Psychosocial Distress Screening Clinical Social Work  Clinical Social Work was referred by distress screening protocol.  The patient scored a 7 on the Psychosocial Distress Thermometer which indicates moderate distress. Clinical Social Worker contacted patient to assess for distress and other psychosocial needs.  Patient states his distress is higher after meeting with Dr. Valere Dross due to making decisions about treatment (surgery vs implants).  The patient states he does not want to discuss his distress at this time.  CSW encouraged patient to contact CSW if he changes his mind and would like support.  ONCBCN DISTRESS SCREENING 08/02/2013  Screening Type Initial Screening  Elta [place] the number that describes how much distress you have been experiencing in the past week 7  Emotional problem type Depression;Nervousness/Anxiety;Adjusting to illness  Physical Problem type Pain;Sleep/insomnia;Loss of appetitie;Sexual problems    Clinical Social Worker follow up needed: no  If yes, follow up plan:   Polo Riley, MSW, LCSW, OSW-C Clinical Social Worker [facility] [phone]

## 2013-08-02 NOTE — Progress Notes (Signed)
[facility] NEW PATIENT EVALUATION  Name: Martin Murphy MRN: [id number]  Date:   08/02/2013           DOB: Sep 03, 1965  Status: outpatient   CC: Martin Huh, MD  Martin Amass, MD    REFERRING PHYSICIAN: Bernestine Amass, MD   DIAGNOSIS:  Stage TI C. favorable risk adenocarcinoma prostate  HISTORY OF PRESENT ILLNESS:  Martin Murphy is a 48 y.o. male who is is seen today through the courtesy of Dr. Risa Murphy for evaluation of his stage TI C. favorable risk adenocarcinoma prostate. He was noted to have arise in his PSA from 1.0 to 2.8 and with a family history prostate cancer and he was referred to Dr. Risa Murphy for further evaluation. He underwent ultrasound-guided biopsies on 12/20/2012 and was found to have Gleason's 6 (3+3) involving 5% of one core from the right apex and also high grade PIN from the right mid gland. The patient elected observation and his PSA rose to 4.51 by 05/10/2013. Repeat biopsies showed Gleason 6 (3+3) involving less than 5% of one core from the right lateral base, 5% of one core from the right base and 5% of one core from the left lateral apex. He also had high-grade PIN from the right base. His gland volume was approximately 36 cc.  Dr. Risa Murphy recommended robotic prostatectomy. The patient is reluctant to have surgery and wants to investigate possible radiation therapy. He is doing reasonably well from a GU and GI standpoint. His I PSS score 7. He claims to be potent but has used Cialis in the past which has been helpful. 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  PREVIOUS RADIATION THERAPY: No   PAST MEDICAL HISTORY:  has a past medical history of Hypertension; Sarcoid; Right inguinal hernia; and Prostate cancer (12/20/12, 06/07/13).     PAST SURGICAL HISTORY:  Past Surgical History  Procedure Laterality Date  . Sarcoid      neck  . Hernia repair  1993  . Hernia repair   02/09/12    right inguinal repair with mesh  . Prostate biopsy  12/20/12    gleason 6  . Prostate biopsy  06/07/13    gleason 6, vol 36 gms     FAMILY HISTORY: family history includes Cancer in his maternal grandfather and maternal uncle; Diabetes in his brother; Hypertension in his brother. His maternal grandfather was diagnosed with prostate cancer at age 5 and a maternal uncle was also diagnosed with prostate cancer at age 36. Another maternal uncle was diagnosed with prostate cancer but he is unclear about his age. His father died at age 26, uncertain etiology. His mother is alive and well 54.   SOCIAL HISTORY:  reports that he has never smoked. He has never used smokeless tobacco. He reports that he does not drink alcohol or use illicit drugs. Divorced, one son age 80. He works as a Merchant navy officer for [employer]   ALLERGIES: Review of patient's allergies indicates no known allergies.   MEDICATIONS:  Current Outpatient Prescriptions  Medication Sig Dispense Refill  . etodolac (LODINE) 300 MG capsule Take 300 mg by mouth 2 (two) times daily.      . hydrochlorothiazide (HYDRODIURIL) 25 MG tablet Take 25 mg by mouth daily.      . ILEVRO 0.3 % SUSP       . Multiple Vitamin (MULTIVITAMIN) tablet Take 1 tablet by mouth 2 (two)  times daily.       No current facility-administered medications for this encounter.     REVIEW OF SYSTEMS:  Pertinent items are noted in HPI.    PHYSICAL EXAM:  height is 5\' 11"  (1.803 m) and weight is 203 lb 12.8 oz (92.443 kg). His oral temperature is 98.9 F (37.2 C). His blood pressure is 116/76 and his pulse is 84. His respiration is 20.   Alert and oriented 48 year old African American male appearing his stated age. Head and neck examination: Grossly unremarkable. Nodes: Without palpable cervical/supraclavicular lymphadenopathy. Chest: Lungs clear. Heart: Regular rate and rhythm. Abdomen: Without masses organomegaly. Back: Without spinal or CVA  tenderness. Genitalia: Unremarkable to inspection. Rectal: The prostate gland is normal in size and is without focal induration or nodularity. Extremities: Without edema.   LABORATORY DATA:  Lab Results  Component Value Date   WBC 5.5 05/31/2013   HGB 12.7* 05/31/2013   HCT 38.8 05/31/2013   MCV 83.9 05/31/2013   PLT 230 05/31/2013   Lab Results  Component Value Date   NA 139 05/31/2013   K 3.4* 05/31/2013   CO2 26 05/31/2013   Lab Results  Component Value Date   ALT 60* 05/31/2013   AST 49* 05/31/2013   ALKPHOS 280* 05/31/2013   BILITOT 0.45 05/31/2013   PSA 4.51 from 05/10/2013   IMPRESSION: Stage TI C. favorable risk adenocarcinoma prostate. I explained to the patient that his prognosis is related to his stage, PSA level, and Gleason score. All are favorable. We discussed surgery versus continued surveillance versus radiation therapy. Radiation therapy options include seed implantation alone or 8 weeks of external beam/IMRT. We discussed seed implantation in great detail and also the potential acute and late toxicities of radiation therapy. In view of his young age, I strongly recommend robotic prostatectomy. My concerns about radiation therapy for a 48 year old patient who has a strong family history would be that he would still be at risk for development of any prostate cancer since we do not eradicate every glandular element within the prostate. He would also be a small risk for development of a radiation-related malignancy during his lifetime. Surgery would eliminate the risk for late radiation toxicity, primarily urethral and rectal toxicity. I strongly encouraged him to consider surgery, and he'll get back in touch with Dr. Risa Murphy. If she absolutely refuses surgery then I would offer him radiation therapy.   PLAN: As discussed above.  I spent 60 minutes minutes face to face with the patient and more than 50% of that time was spent in counseling and/or coordination of care.

## 2013-08-23 ENCOUNTER — Other Ambulatory Visit: Payer: Self-pay | Admitting: Specialist

## 2013-08-23 DIAGNOSIS — C61 Malignant neoplasm of prostate: Secondary | ICD-10-CM

## 2013-08-26 ENCOUNTER — Other Ambulatory Visit: Payer: 59

## 2013-08-29 ENCOUNTER — Ambulatory Visit
Admission: RE | Admit: 2013-08-29 | Discharge: 2013-08-29 | Disposition: A | Discharge status: Home / Self Care | Payer: 59 | Source: Ambulatory Visit | Attending: Specialist | Admitting: Specialist

## 2013-08-29 DIAGNOSIS — C61 Malignant neoplasm of prostate: Secondary | ICD-10-CM

## 2013-08-30 ENCOUNTER — Telehealth: Payer: Self-pay | Admitting: *Deleted

## 2013-08-30 NOTE — Telephone Encounter (Signed)
Received call from patient stating he does not understand why he needs to see Dr Valere Dross for follow up. Patient saw Dr Valere Dross on 08/02/13 for initial new consult, re: possible radiation treatment for prostate cancer. Patient states he spoke w/Dr Risa Grill this morning, and he is ready to have a seed implant. Informed pt that Dr Valere Dross is out of office this week, returns July 6 th. Informed pt this Probation officer will e mail Dr Valere Dross about his request, but also advised pt that Dr Valere Dross has to place orders and then the surgery has to be coordinated between Dr Valere Dross and Dr Risa Grill. Informed Martin Murphy that when Dr Valere Dross receives this e mail he will determine if pt needs to keep July 9 th FU appointment. If not, pt will receive a call from Verdell Carmine, medical secretary who will give him further instructions. Patient stated "I will come for the appointment on the 9 th." Reminded him that if Dr Valere Dross says this is not necessary, he will receive a call from Verdell Carmine prior to July 9 th.  Pt verbalized understanding.

## 2013-09-05 ENCOUNTER — Telehealth: Payer: Self-pay | Admitting: Internal Medicine

## 2013-09-05 NOTE — Telephone Encounter (Signed)
lvm for pt regarding to OCT appt moved to 10.28 per MD on Pal...mailed pt appt sched/letter

## 2013-09-08 ENCOUNTER — Ambulatory Visit
Admission: RE | Admit: 2013-09-08 | Discharge: 2013-09-08 | Disposition: A | Discharge status: Home / Self Care | Payer: 59 | Source: Ambulatory Visit | Attending: Radiation Oncology | Admitting: Radiation Oncology

## 2013-09-08 ENCOUNTER — Encounter: Payer: Self-pay | Admitting: Radiation Oncology

## 2013-09-08 VITALS — BP 130/81 | HR 85 | Temp 99.2°F | Resp 20 | Ht 71.0 in | Wt 204.2 lb

## 2013-09-08 DIAGNOSIS — Z510 Encounter for antineoplastic radiation therapy: Secondary | ICD-10-CM | POA: Diagnosis not present

## 2013-09-08 DIAGNOSIS — C61 Malignant neoplasm of prostate: Secondary | ICD-10-CM

## 2013-09-08 NOTE — Progress Notes (Signed)
Please see the Nurse Progress Note in the MD Initial Consult Encounter for this patient. 

## 2013-09-08 NOTE — Progress Notes (Signed)
CC: Dr. Rana Snare  Diagnosis: Stage TI C. favorable risk adenocarcinoma prostate  [name] visits today for consent and scheduling of his prostate seed implant. He visited one of the radiotherapy clinics of [place]. He ordered a  CT scan of his pelvis on 08/29/2013. His was found to have a prostate volume of 27 cc. Of note is that Dr. Risa Grill measured a volume of 36 cc by ultrasound. He remains reluctant to have surgery and would like to proceed with prostate seed implantation. We discussed the potential acute and late toxicities as implantation and also radiation safety concerns. Consent is signed today.  Impression: Stage TI C. favorable risk adenocarcinoma prostate.  Plan: We'll move ahead with scheduling of his prostate seed implant. We will not perform a CT arch study since he has both ultrasound and CT scan measurements and it would be extremely unlikely for [place] to have bony arch interference at the time of implantation with a gland less than 40 cc.

## 2013-09-08 NOTE — Progress Notes (Signed)
Follow up New Consult  From 08/02/13 Prostate Cancer, patient wants to pursue Brachytherapy, CT pelvis  08/29/13 showed prostate measures 3.4x 4.1cm (volume 27cm)

## 2013-09-08 NOTE — Progress Notes (Signed)
Treatment planning note: The patient underwent treatment planning today before his prostate seed implant. I am prescribing 14,500 cGy utilizing I-125 seeds. Dr. Risa Grill measured a prostate volume of 36 cc, while his CT scan from 08/29/2013 was 27 cc. I will order seeds for a 35 cc prostate gland. He is to be implanted with [employer] system.

## 2013-09-09 ENCOUNTER — Telehealth: Payer: Self-pay | Admitting: *Deleted

## 2013-09-09 ENCOUNTER — Other Ambulatory Visit: Payer: Self-pay | Admitting: Urology

## 2013-09-09 NOTE — Telephone Encounter (Signed)
CALLED PATIENT TO INFORM OF IMPLANT, LVM FOR A RETURN CALL

## 2013-09-12 ENCOUNTER — Other Ambulatory Visit: Payer: Self-pay | Admitting: Urology

## 2013-09-12 ENCOUNTER — Telehealth: Payer: Self-pay | Admitting: *Deleted

## 2013-09-12 ENCOUNTER — Ambulatory Visit (HOSPITAL_COMMUNITY)
Admission: RE | Admit: 2013-09-12 | Discharge: 2013-09-12 | Disposition: A | Discharge status: Home / Self Care | Payer: 59 | Source: Ambulatory Visit | Attending: Urology | Admitting: Urology

## 2013-09-12 ENCOUNTER — Other Ambulatory Visit: Payer: Self-pay

## 2013-09-12 DIAGNOSIS — C61 Malignant neoplasm of prostate: Secondary | ICD-10-CM | POA: Insufficient documentation

## 2013-09-12 DIAGNOSIS — I10 Essential (primary) hypertension: Secondary | ICD-10-CM | POA: Insufficient documentation

## 2013-09-12 MED ORDER — CIPROFLOXACIN IN D5W 400 MG/200ML IV SOLN
400.0000 mg | INTRAVENOUS | Status: DC
  Filled 2013-09-12: qty 200

## 2013-09-12 MED ORDER — FLEET ENEMA 7-19 GM/118ML RE ENEM
1.0000 | ENEMA | Freq: Once | RECTAL | Status: AC
  Administered 2013-10-20: 1 via RECTAL
  Filled 2013-09-12: qty 1

## 2013-09-12 NOTE — Telephone Encounter (Signed)
Called patient to answer his question, lvm for a return call

## 2013-10-12 ENCOUNTER — Telehealth: Payer: Self-pay | Admitting: *Deleted

## 2013-10-12 NOTE — Telephone Encounter (Signed)
CALLED PATIENT TO REMIND OF LAB FOR 10-13-13, SPOKE WITH PATIENT AND HE IS AWARE OF THIS APPT.

## 2013-10-13 ENCOUNTER — Encounter (HOSPITAL_BASED_OUTPATIENT_CLINIC_OR_DEPARTMENT_OTHER): Payer: Self-pay | Admitting: *Deleted

## 2013-10-13 DIAGNOSIS — Z8042 Family history of malignant neoplasm of prostate: Secondary | ICD-10-CM | POA: Diagnosis not present

## 2013-10-13 DIAGNOSIS — Z79899 Other long term (current) drug therapy: Secondary | ICD-10-CM | POA: Diagnosis not present

## 2013-10-13 DIAGNOSIS — C61 Malignant neoplasm of prostate: Secondary | ICD-10-CM | POA: Diagnosis not present

## 2013-10-13 DIAGNOSIS — I10 Essential (primary) hypertension: Secondary | ICD-10-CM | POA: Diagnosis not present

## 2013-10-13 LAB — COMPREHENSIVE METABOLIC PANEL
ALBUMIN: 3.6 g/dL (ref 3.5–5.2)
ALK PHOS: 213 U/L — AB (ref 39–117)
ALT: 56 U/L — ABNORMAL HIGH (ref 0–53)
ANION GAP: 13 (ref 5–15)
AST: 46 U/L — ABNORMAL HIGH (ref 0–37)
BUN: 16 mg/dL (ref 6–23)
CO2: 26 mEq/L (ref 19–32)
CREATININE: 1.13 mg/dL (ref 0.50–1.35)
Calcium: 10 mg/dL (ref 8.4–10.5)
Chloride: 102 mEq/L (ref 96–112)
GFR calc non Af Amer: 74 mL/min — ABNORMAL LOW (ref >90)
GFR, EST AFRICAN AMERICAN: 86 mL/min — AB (ref >90)
GLUCOSE: 85 mg/dL (ref 70–99)
POTASSIUM: 4.4 meq/L (ref 3.7–5.3)
Sodium: 141 mEq/L (ref 137–147)
TOTAL PROTEIN: 8.3 g/dL (ref 6.0–8.3)
Total Bilirubin: 0.3 mg/dL (ref 0.3–1.2)

## 2013-10-13 LAB — PROTIME-INR
INR: 0.96 (ref 0.00–1.49)
Prothrombin Time: 12.8 seconds (ref 11.6–15.2)

## 2013-10-13 LAB — CBC
HEMATOCRIT: 40.2 % (ref 39.0–52.0)
Hemoglobin: 13.4 g/dL (ref 13.0–17.0)
MCH: 27.9 pg (ref 26.0–34.0)
MCHC: 33.3 g/dL (ref 30.0–36.0)
MCV: 83.6 fL (ref 78.0–100.0)
Platelets: 214 10*3/uL (ref 150–400)
RBC: 4.81 MIL/uL (ref 4.22–5.81)
RDW: 13.8 % (ref 11.5–15.5)
WBC: 4.8 10*3/uL (ref 4.0–10.5)

## 2013-10-13 LAB — APTT: aPTT: 29 seconds (ref 24–37)

## 2013-10-13 NOTE — Progress Notes (Signed)
10/13/13 1345  OBSTRUCTIVE SLEEP APNEA  Have you ever been diagnosed with sleep apnea through a sleep study? No  Do you snore loudly (loud enough to be heard through closed doors)?  1  Do you often feel tired, fatigued, or sleepy during the daytime? 0  Has anyone observed you stop breathing during your sleep? 1  Do you have, or are you being treated for high blood pressure? 1  BMI more than 35 kg/m2? 0  Age over 50 years old? 0  Neck circumference greater than 40 cm/16 inches? 1  Gender: 1  Obstructive Sleep Apnea Score 5  Score 4 or greater  Results sent to PCP

## 2013-10-13 NOTE — Progress Notes (Signed)
NPO AFTER MN. ARRIVE AT 0600. CURRENT EKG, CXR, AND LAB RESULTS IN EPIC. WILL DO FLEET ENEMA AM DOS.

## 2013-10-14 ENCOUNTER — Telehealth: Payer: Self-pay | Admitting: *Deleted

## 2013-10-14 ENCOUNTER — Ambulatory Visit (INDEPENDENT_AMBULATORY_CARE_PROVIDER_SITE_OTHER): Payer: 59 | Admitting: Podiatrist

## 2013-10-14 ENCOUNTER — Ambulatory Visit (INDEPENDENT_AMBULATORY_CARE_PROVIDER_SITE_OTHER): Payer: 59

## 2013-10-14 ENCOUNTER — Encounter: Payer: Self-pay | Admitting: Podiatrist

## 2013-10-14 VITALS — BP 123/80 | HR 83 | Resp 16 | Ht 71.0 in | Wt 208.0 lb

## 2013-10-14 DIAGNOSIS — M2010 Hallux valgus (acquired), unspecified foot: Secondary | ICD-10-CM

## 2013-10-14 NOTE — Progress Notes (Signed)
   Subjective:    Patient ID: Martin Murphy, male    DOB: 05/12/1965, 48 y.o.   MRN: [id number]  HPI Comments: "I have feet pain all over"  Patient states that his feet ache all over, bilaterally for 2 years. He can't pinpoint an area of pain but does indicate it could be from the bunions. He also has a soft knot on each arch. Tried soaking, heat wraps, different shoes-no help. Wearing flip flops or sandals make worse.  Foot Pain      Review of Systems  Musculoskeletal: Positive for back pain.  All other systems reviewed and are negative.      Objective:   Physical Exam  GENERAL APPEARANCE: Alert, conversant. Appropriately groomed. No acute distress.  VASCULAR: Pedal pulses palpable at 2/4 DP and PT bilateral.  Capillary refill time is immediate to all digits,  Proximal to distal cooling it warm to warm.  Digital hair growth is present bilateral  NEUROLOGIC: sensation is intact epicritically and protectively to 5.07 monofilament at 5/5 sites bilateral.  Light touch is intact bilateral, vibratory sensation intact bilateral, achilles tendon reflex is intact bilateral.  MUSCULOSKELETAL: acceptable muscle strength, tone and stability bilateral. Hallux abductovalgus deformity is present left greater than right. Generalized discomfort at the digits is also present bilateral. No sign of arthritic changes in the digits are noted clinically or radiographically however large bunion deformity is present which she states is uncomfortable DERMATOLOGIC: skin color, texture, and turger are within normal limits.  No preulcerative lesions are seen, no interdigital maceration noted.  No open lesions present.  Digital nails are asymptomatic.     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 Recommended a bunion correction with screw fixation. The consent form was discussed and all three pages were signed and the patient's  questions were encouraged and answered to the best of my ability. Risks of the surgery were discussed including but not limited to continued pain, infection, swelling, elevated toe, decreased range of motion,  suture or implant reaction, bleeding, decreased function, etc. Preoperative instructions were also dispensed to the patient as well as a preoperative surgical pamphlet to go along with the instructions. Surgery will be scheduled at the patients convenience and patient will be seen at [facility] specialty surgery center on outpatient basis.The patient is instructed to call if any questions or concerns arise.  He is going to speak with his oncologist prior to the elective foot surgery to make sure it is OK to proceed.  He will call to set up the date of surgery at his convenience.

## 2013-10-14 NOTE — Patient Instructions (Signed)
Pre-Operative Instructions  Congratulations, you have decided to take an important step to improving your quality of life.  You can be assured that the doctors of [facility] will be with you every step of the way.  1. Plan to be at the surgery center/hospital at least 1 (one) hour prior to your scheduled time unless otherwise directed by the surgical center/hospital staff.  You must have a responsible adult accompany you, remain during the surgery and drive you home.  Make sure you have directions to the surgical center/hospital and know how to get there on time. 2. For hospital based surgery you will need to obtain a history and physical form from your family physician within 1 month prior to the date of surgery- we will give you a form for you primary physician.  3. We make every effort to accommodate the date you request for surgery.  There are however, times where surgery dates or times have to be moved.  We will contact you as soon as possible if a change in schedule is required.   4. No Aspirin/Ibuprofen for one week before surgery.  If you are on aspirin, any non-steroidal anti-inflammatory medications (Mobic, Aleve, Ibuprofen) you should stop taking it 7 days prior to your surgery.  You make take Tylenol  For pain prior to surgery.  5. Medications- If you are taking daily heart and blood pressure medications, seizure, reflux, allergy, asthma, anxiety, pain or diabetes medications, make sure the surgery center/hospital is aware before the day of surgery so they may notify you which medications to take or avoid the day of surgery. 6. No food or drink after midnight the night before surgery unless directed otherwise by surgical center/hospital staff. 7. No alcoholic beverages 24 hours prior to surgery.  No smoking 24 hours prior to or 24 hours after surgery. 8. Wear loose pants or shorts- loose enough to fit over bandages, boots, and casts. 9. No slip on shoes, sneakers are best. 10. Bring  your boot with you to the surgery center/hospital.  Also bring crutches or a walker if your physician has prescribed it for you.  If you do not have this equipment, it will be provided for you after surgery. 11. If you have not been contracted by the surgery center/hospital by the day before your surgery, call to confirm the date and time of your surgery. 12. Leave-time from work may vary depending on the type of surgery you have.  Appropriate arrangements should be made prior to surgery with your employer. 13. Prescriptions will be provided immediately following surgery by your doctor.  Have these filled as soon as possible after surgery and take the medication as directed. 14. Remove nail polish on the operative foot. 15. Wash the night before surgery.  The night before surgery wash the foot and leg well with the antibacterial soap provided and water paying special attention to beneath the toenails and in between the toes.  Rinse thoroughly with water and dry well with a towel.  Perform this wash unless told not to do so by your physician.  Enclosed: 1 Ice pack (please put in freezer the night before surgery)   1 Hibiclens skin cleaner   Pre-op Instructions  If you have any questions regarding the instructions, do not hesitate to call our office.  [place]: [address] [place], [place] [address] [phone]  [place]: [address] [place], [place] [address] [phone]  [place]: [address]  [place], [place] [address] [phone]  Dr. Richard   Tuchman DPM, Dr. Norman Regal DPM Dr. Richard Sikora DPM, Dr. M. Todd Hyatt DPM, Dr. Fremont Skalicky DPM 

## 2013-10-14 NOTE — Telephone Encounter (Signed)
CALLED PATIENT TO REMIND OF PROCEDURE FOR 10-20-13, SPOKE WITH PATIENT AND HE IS AWARE OF THIS PROCEDURE.

## 2013-10-19 NOTE — Anesthesia Preprocedure Evaluation (Addendum)
Anesthesia Evaluation  Patient identified by MRN, date of birth, ID band Patient awake    Reviewed: Allergy & Precautions, H&P , NPO status , Patient's Chart, lab work & pertinent test results  Airway Mallampati: I TM Distance: >3 FB Neck ROM: Full    Dental  (+) Teeth Intact, Dental Advisory Given   Pulmonary neg pulmonary ROS,  breath sounds clear to auscultation        Cardiovascular hypertension, Pt. on medications Rhythm:Regular Rate:Normal     Neuro/Psych negative neurological ROS  negative psych ROS   GI/Hepatic negative GI ROS, Neg liver ROS,   Endo/Other  negative endocrine ROS  Renal/GU negative Renal ROS   Prostate CA    Musculoskeletal negative musculoskeletal ROS (+)   Abdominal   Peds  Hematology negative hematology ROS (+)   Anesthesia Other Findings   Reproductive/Obstetrics negative OB ROS                         Anesthesia Physical Anesthesia Plan  ASA: II  Anesthesia Plan: General   Post-op Pain Management:    Induction: Intravenous  Airway Management Planned: Oral ETT  Additional Equipment: None  Intra-op Plan:   Post-operative Plan: Extubation in OR  Informed Consent: I have reviewed the patients History and Physical, chart, labs and discussed the procedure including the risks, benefits and alternatives for the proposed anesthesia with the patient or authorized representative who has indicated his/her understanding and acceptance.   Dental advisory given  Plan Discussed with: CRNA  Anesthesia Plan Comments:        Anesthesia Quick Evaluation

## 2013-10-19 NOTE — H&P (Signed)
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   History of Present Illness   Martin Murphy underwent prostate ultrasound and biopsy in October 2014. He was done secondary to a PSA increased from 1.0-2.8. He also had a strong family history of prostate cancer. . Prostate ultrasound had revealed a prostate that was fairly normal size at approximately 36 grams. Only 1/12 biopsies showed any cancer and it was a Gleason 3+3=6 cancer involving approximately 5% of Martin biopsy material.    SHIM is 22/25  IPSS 7     After lengthy discussion and consultation Martin Murphy shows a period of initial active surveillance. On follow-up in March 2015 PSA had increased to 4.5. Clinically there was no change. IPSS score at that time was 3. As part of his standard protocol we elected to proceed with repeat ultrasound and biopsy. Prostatic MRIs being considered but I do think it depends somewhat on how he wants to proceed with regard to treatment versus ongoing active surveillance. Repeat ultrasound again revealed a 33 g prostate. 3 out of 12 biopsy cores were positive. These were all Gleason's 3+3 = 6 cancer with 5% or less involvement. His previous biopsy was positive at Martin right apex. On repeat biopsy that area was negative and Martin tumor appeared to be more at Martin right base. He also had a positive biopsy at Martin left apex.    It was my recommendation that he strongly consider treatment based on his most recent biopsy. Active surveillance was certainly an ongoing option but given his age we felt he would be better served with active treatment. Martin Murphy expressed a lack of interest in Martin surgical approach and therefore I sent him to Dr. Valere Dross to discuss implantation. Dr. Valere Dross felt Martin Murphy would be much better off with a robotic prostatectomy and suggested that he really discuss his situation with me.  Surgical History Problems  1. History of  Hernia Repair 2. History of Inguinal Hernia Repair 3. History of Lung Surgery  Current Meds 1. Hydrochlorothiazide 25 MG Oral Tablet;  Therapy: 28Apr2014 to Recorded  Allergies Medication  1. No Known Drug Allergies  Family History Problems  1. Family history of Family Health Status Number Of Children : Brother   one son 2. Family history of Hypertension (V17.49) : Brother 3. Family history of Prostate Cancer (V16.42)  Social History Problems  1. Denied: History of Alcohol Use 2. Caffeine Use   2 aday 3. Marital History - Currently Married 4. Never A Smoker 5. Occupation:   [employer] 6. Denied: History of Tobacco Use  Review of Systems Genitourinary, constitutional, skin, eye, otolaryngeal, hematologic/lymphatic, cardiovascular, pulmonary, endocrine, musculoskeletal, gastrointestinal, neurological and psychiatric system(s) were reviewed and pertinent findings if present are noted.  Genitourinary: urinary frequency, feelings of urinary urgency, nocturia and erectile dysfunction, but no dysuria and no hematuria.  Gastrointestinal: constipation.  Constitutional: fever and feeling tired (fatigue).  Respiratory: shortness of breath.  Psychiatric: depression.    Vitals   Blood Pressure: 125 / 83 Temperature: 98.1 F Heart Rate: 76   Physical Exam Constitutional: Well nourished and well developed . No acute distress.  ENT:. Martin ears and nose are normal in appearance.  Neck: Martin appearance of Martin neck is normal and no neck mass is present.  Pulmonary: No respiratory distress and normal respiratory rhythm and effort.  Cardiovascular: Heart rate and rhythm are normal . No peripheral edema.  Abdomen: Martin abdomen  is soft and nontender. No masses are palpated. No CVA tenderness. No hernias are palpable. No hepatosplenomegaly noted.  Rectal: Rectal exam demonstrates normal sphincter tone, no tenderness and no masses. Estimated prostate size is 1+. Normal rectal tone, no  rectal masses, prostate is smooth, symmetric and non-tender. Martin prostate has no nodularity and is not tender. Martin left seminal vesicle is nonpalpable. Martin right seminal vesicle is nonpalpable. Martin perineum is normal on inspection.  Genitourinary: Examination of Martin penis demonstrates no discharge, no masses, no lesions and a normal meatus. Martin penis is uncircumcised. Martin scrotum is without lesions. Martin right epididymis is palpably normal and non-tender. Martin left epididymis is palpably normal and non-tender. Martin right testis is non-tender and without masses. Martin left testis is non-tender and without masses.  Lymphatics: Martin femoral and inguinal nodes are not enlarged or tender.  Skin: Normal skin turgor, no visible rash and no visible skin lesions.  Neuro/Psych:. Mood and affect are appropriate.    Results/Data Urine  COLOR YELLOW  APPEARANCE CLEAR  SPECIFIC GRAVITY 1.020  pH 5.5  GLUCOSE NEG mg/dL BILIRUBIN NEG  KETONE NEG mg/dL BLOOD TRACE  PROTEIN NEG mg/dL UROBILINOGEN 0.2 mg/dL NITRITE NEG  LEUKOCYTE ESTERASE NEG  SQUAMOUS EPITHELIAL/HPF RARE  WBC 0-2 WBC/hpf RBC 0-2 RBC/hpf BACTERIA RARE  CRYSTALS NONE SEEN  CASTS NONE SEEN  Other MUCUS NOTED   Assessment Assessed  1. Prostate cancer (185)  Plan Health Maintenance  1. UA With REFLEX; [Do Not Release]; Status:Resulted - Requires Verification;   Done:  [id number] 08:38AM  Discussion/Summary   Martin Murphy has given empirical consideration to both of my recommendations as well as Dr Charlton Amor. He appreciates our concern about his young age and good health but really has decided that he does not want to pursue surgery for treatment of his prostate cancer. He has been in touch with [facility] radiation therapy clinics in [place]. They feel is a good candidate for seed implantation. They ordered a pelvic CT here in [place], which was performed. Not surprisingly, it showed nothing of concern and a prolapse of about 27 grams, which  correlated well with my ultrasound results. Martin Murphy really would like to pursue brachytherapy and just assume have that in [place] if possible. I think we can provide equivalent care for him, given our longstanding track record with brachytherapy and large volume experience. I contacted Dr Valere Dross today, who is in agreement with proceeding with brachytherapy. I would recommend against external beam radiation therapy plus a seed boost, given his favorable situation. I contacted Martin radiation oncology department today to go ahead with attempts at getting him scheduled for sometime in Martin relatively near future. Dr Valere Dross will contact Martin Murphy and review Martin pelvic CT when he gets back in town.

## 2013-10-20 ENCOUNTER — Ambulatory Visit (HOSPITAL_BASED_OUTPATIENT_CLINIC_OR_DEPARTMENT_OTHER): Payer: 59 | Admitting: Anesthesiology

## 2013-10-20 ENCOUNTER — Encounter: Payer: Self-pay | Admitting: Radiation Oncology

## 2013-10-20 ENCOUNTER — Encounter (HOSPITAL_BASED_OUTPATIENT_CLINIC_OR_DEPARTMENT_OTHER): Payer: 59 | Admitting: Anesthesiology

## 2013-10-20 ENCOUNTER — Ambulatory Visit (HOSPITAL_BASED_OUTPATIENT_CLINIC_OR_DEPARTMENT_OTHER)
Admission: RE | Admit: 2013-10-20 | Discharge: 2013-10-20 | Disposition: A | Discharge status: Home / Self Care | Payer: 59 | Source: Ambulatory Visit | Attending: Urology | Admitting: Urology

## 2013-10-20 ENCOUNTER — Encounter (HOSPITAL_BASED_OUTPATIENT_CLINIC_OR_DEPARTMENT_OTHER)
Admission: RE | Disposition: A | Discharge status: Home / Self Care | Payer: Self-pay | Source: Ambulatory Visit | Attending: Urology

## 2013-10-20 ENCOUNTER — Ambulatory Visit (HOSPITAL_COMMUNITY): Payer: 59

## 2013-10-20 ENCOUNTER — Encounter (HOSPITAL_BASED_OUTPATIENT_CLINIC_OR_DEPARTMENT_OTHER): Payer: Self-pay | Admitting: *Deleted

## 2013-10-20 DIAGNOSIS — Z8042 Family history of malignant neoplasm of prostate: Secondary | ICD-10-CM | POA: Insufficient documentation

## 2013-10-20 DIAGNOSIS — C61 Malignant neoplasm of prostate: Secondary | ICD-10-CM | POA: Diagnosis not present

## 2013-10-20 DIAGNOSIS — I10 Essential (primary) hypertension: Secondary | ICD-10-CM | POA: Insufficient documentation

## 2013-10-20 DIAGNOSIS — Z79899 Other long term (current) drug therapy: Secondary | ICD-10-CM | POA: Insufficient documentation

## 2013-10-20 HISTORY — DX: Bunion of unspecified foot: M21.619

## 2013-10-20 HISTORY — DX: Sarcoidosis of lung: D86.0

## 2013-10-20 HISTORY — DX: Other specified personal risk factors, not elsewhere classified: Z91.89

## 2013-10-20 HISTORY — PX: RADIOACTIVE SEED IMPLANT: SHX5150

## 2013-10-20 HISTORY — DX: Nocturia: R35.1

## 2013-10-20 SURGERY — INSERTION, RADIATION SOURCE, PROSTATE
Anesthesia: General | Site: Prostate

## 2013-10-20 MED ORDER — ROCURONIUM BROMIDE 100 MG/10ML IV SOLN
INTRAVENOUS | Status: DC | PRN
  Administered 2013-10-20: 10 mg via INTRAVENOUS
  Administered 2013-10-20: 30 mg via INTRAVENOUS

## 2013-10-20 MED ORDER — PROPOFOL 10 MG/ML IV BOLUS
INTRAVENOUS | Status: DC | PRN
  Administered 2013-10-20: 200 mg via INTRAVENOUS

## 2013-10-20 MED ORDER — HYDROCODONE-ACETAMINOPHEN 5-325 MG PO TABS
1.0000 | ORAL_TABLET | Freq: Four times a day (QID) | ORAL | Status: DC | PRN
Start: 2013-10-20 — End: 2013-11-16

## 2013-10-20 MED ORDER — CIPROFLOXACIN HCL 500 MG PO TABS: 500.0000 mg | ORAL_TABLET | Freq: Two times a day (BID) | ORAL | Status: DC

## 2013-10-20 MED ORDER — STERILE WATER FOR IRRIGATION IR SOLN
Status: DC | PRN
  Administered 2013-10-20: 3000 mL

## 2013-10-20 MED ORDER — OXYCODONE HCL 5 MG/5ML PO SOLN
5.0000 mg | Freq: Once | ORAL | Status: AC | PRN
  Filled 2013-10-20: qty 5

## 2013-10-20 MED ORDER — FENTANYL CITRATE 0.05 MG/ML IJ SOLN
INTRAMUSCULAR | Status: DC | PRN
  Administered 2013-10-20 (×4): 50 ug via INTRAVENOUS

## 2013-10-20 MED ORDER — LIDOCAINE HCL (CARDIAC) 20 MG/ML IV SOLN
INTRAVENOUS | Status: DC | PRN
  Administered 2013-10-20: 100 mg via INTRAVENOUS

## 2013-10-20 MED ORDER — ACETAMINOPHEN 10 MG/ML IV SOLN
INTRAVENOUS | Status: DC | PRN
  Administered 2013-10-20: 1000 mg via INTRAVENOUS

## 2013-10-20 MED ORDER — MIDAZOLAM HCL 2 MG/2ML IJ SOLN
INTRAMUSCULAR | Status: AC
  Filled 2013-10-20: qty 2

## 2013-10-20 MED ORDER — OXYCODONE HCL 5 MG PO TABS
ORAL_TABLET | ORAL | Status: AC
  Filled 2013-10-20: qty 1

## 2013-10-20 MED ORDER — MIDAZOLAM HCL 5 MG/5ML IJ SOLN
INTRAMUSCULAR | Status: DC | PRN
  Administered 2013-10-20: 2 mg via INTRAVENOUS

## 2013-10-20 MED ORDER — SUCCINYLCHOLINE CHLORIDE 20 MG/ML IJ SOLN
INTRAMUSCULAR | Status: DC | PRN
  Administered 2013-10-20: 120 mg via INTRAVENOUS

## 2013-10-20 MED ORDER — IOHEXOL 350 MG/ML SOLN
INTRAVENOUS | Status: DC | PRN
  Administered 2013-10-20: 7 mL

## 2013-10-20 MED ORDER — CEFTRIAXONE SODIUM 2 G IJ SOLR
INTRAMUSCULAR | Status: AC
  Filled 2013-10-20: qty 2

## 2013-10-20 MED ORDER — DEXTROSE 5 % IV SOLN
2.0000 g | INTRAVENOUS | Status: DC
  Administered 2013-10-20: 2 g via INTRAVENOUS
  Filled 2013-10-20: qty 2

## 2013-10-20 MED ORDER — LIDOCAINE HCL 2 % EX GEL
CUTANEOUS | Status: DC | PRN
  Administered 2013-10-20: 1 via URETHRAL

## 2013-10-20 MED ORDER — FENTANYL CITRATE 0.05 MG/ML IJ SOLN
INTRAMUSCULAR | Status: AC
  Filled 2013-10-20: qty 4

## 2013-10-20 MED ORDER — ONDANSETRON HCL 4 MG/2ML IJ SOLN
INTRAMUSCULAR | Status: DC | PRN
Start: 2013-10-20 — End: 2013-10-20
  Administered 2013-10-20: 4 mg via INTRAVENOUS

## 2013-10-20 MED ORDER — PROMETHAZINE HCL 25 MG/ML IJ SOLN
6.2500 mg | INTRAMUSCULAR | Status: DC | PRN
  Filled 2013-10-20: qty 1

## 2013-10-20 MED ORDER — DEXAMETHASONE SODIUM PHOSPHATE 4 MG/ML IJ SOLN
INTRAMUSCULAR | Status: DC | PRN
  Administered 2013-10-20: 10 mg via INTRAVENOUS

## 2013-10-20 MED ORDER — GLYCOPYRROLATE 0.2 MG/ML IJ SOLN
INTRAMUSCULAR | Status: DC | PRN
  Administered 2013-10-20: .8 mg via INTRAVENOUS

## 2013-10-20 MED ORDER — FENTANYL CITRATE 0.05 MG/ML IJ SOLN
25.0000 ug | INTRAMUSCULAR | Status: DC | PRN
  Filled 2013-10-20: qty 1

## 2013-10-20 MED ORDER — LACTATED RINGERS IV SOLN
INTRAVENOUS | Status: DC
  Administered 2013-10-20 (×2): via INTRAVENOUS
  Filled 2013-10-20: qty 1000

## 2013-10-20 MED ORDER — KETOROLAC TROMETHAMINE 30 MG/ML IJ SOLN
INTRAMUSCULAR | Status: DC | PRN
  Administered 2013-10-20: 30 mg via INTRAVENOUS

## 2013-10-20 MED ORDER — OXYCODONE HCL 5 MG PO TABS
5.0000 mg | ORAL_TABLET | Freq: Once | ORAL | Status: AC | PRN
  Administered 2013-10-20: 5 mg via ORAL
  Filled 2013-10-20: qty 1

## 2013-10-20 MED ORDER — NEOSTIGMINE METHYLSULFATE 10 MG/10ML IV SOLN
INTRAVENOUS | Status: DC | PRN
  Administered 2013-10-20: 5 mg via INTRAVENOUS

## 2013-10-20 SURGICAL SUPPLY — 28 items
BAG URINE DRAINAGE (UROLOGICAL SUPPLIES) ×2 IMPLANT
BLADE CLIPPER SURG (BLADE) ×2 IMPLANT
CATH FOLEY 2WAY SLVR  5CC 16FR (CATHETERS) ×2
CATH FOLEY 2WAY SLVR 5CC 16FR (CATHETERS) ×2 IMPLANT
CATH ROBINSON RED A/P 20FR (CATHETERS) ×2 IMPLANT
CLOTH BEACON ORANGE TIMEOUT ST (SAFETY) ×2 IMPLANT
COVER MAYO STAND STRL (DRAPES) ×2 IMPLANT
COVER TABLE BACK 60X90 (DRAPES) ×2 IMPLANT
DRSG TEGADERM 4X4.75 (GAUZE/BANDAGES/DRESSINGS) ×2 IMPLANT
DRSG TEGADERM 8X12 (GAUZE/BANDAGES/DRESSINGS) ×2 IMPLANT
GLOVE BIO SURGEON STRL SZ7.5 (GLOVE) ×8 IMPLANT
GLOVE BIOGEL M 6.5 STRL (GLOVE) ×2 IMPLANT
GLOVE BIOGEL PI IND STRL 6.5 (GLOVE) ×2 IMPLANT
GLOVE BIOGEL PI IND STRL 7.0 (GLOVE) ×1 IMPLANT
GLOVE BIOGEL PI INDICATOR 6.5 (GLOVE) ×2
GLOVE BIOGEL PI INDICATOR 7.0 (GLOVE) ×1
GLOVE ECLIPSE 8.0 STRL XLNG CF (GLOVE) IMPLANT
GOWN STRL REIN XL XLG (GOWN DISPOSABLE) IMPLANT
GOWN STRL REUS W/TWL LRG LVL3 (GOWN DISPOSABLE) ×2 IMPLANT
GOWN STRL REUS W/TWL XL LVL3 (GOWN DISPOSABLE) ×2 IMPLANT
HOLDER FOLEY CATH W/STRAP (MISCELLANEOUS) ×2 IMPLANT
PACK CYSTOSCOPY (CUSTOM PROCEDURE TRAY) ×2 IMPLANT
SPONGE GAUZE 4X4 12PLY STER LF (GAUZE/BANDAGES/DRESSINGS) ×2 IMPLANT
SYRINGE 10CC LL (SYRINGE) ×2 IMPLANT
UNDERPAD 30X30 INCONTINENT (UNDERPADS AND DIAPERS) ×4 IMPLANT
WATER STERILE IRR 3000ML UROMA (IV SOLUTION) ×2 IMPLANT
WATER STERILE IRR 500ML POUR (IV SOLUTION) ×2 IMPLANT
seeds (Urological Implant) ×140 IMPLANT

## 2013-10-20 NOTE — Interval H&P Note (Signed)
History and Physical Interval Note:  10/20/2013 7:32 AM  Martin Murphy  has presented today for surgery, with the diagnosis of PROSTATE CANCER  The various methods of treatment have been discussed with the patient and family. After consideration of risks, benefits and other options for treatment, the patient has consented to  Procedure(Murphy): RADIOACTIVE SEED IMPLANT (N/A) as a surgical intervention .  The patient'Murphy history has been reviewed, patient examined, no change in status, stable for surgery.  I have reviewed the patient'Murphy chart and labs.  Questions were answered to the patient'Murphy satisfaction.     Martin Murphy,Martin Murphy

## 2013-10-20 NOTE — Op Note (Signed)
Preoperative diagnosis: Clinical stage TI C adenocarcinoma the prostate  Postoperative diagnosis: Same  Procedure: I-125 prostate seed implantation with Nucletron robotic implanter  Flexible cystoscopy with removal of prostatic seed Surgeon: Bernestine Amass M.D. , Arloa Koh, M.D.  Anesthesia: Gen.  Indications: Patient  was diagnosed with clinical stage TI C prostate cancer. We had extensive discussion with him about treatment options versus. He elected to proceed with seed implantation. He underwent consultation my office as well as with Dr. Arloa Koh. He appeared to understand the advantages disadvantages potential risks of this treatment option. Full informed consent has been obtained. The patient is had preoperative ciprofloxacin. PAS compression boots were placed.  Technique and findings: Patient was brought the operating room where he had successful induction of general anesthesia. He was placed in lithotomy position and prepped and draped in usual manner. Appropriate surgical timeout was performed. Radiation oncology department placed a transrectal ultrasound probe anchoring stand. Foley catheter with contrast in the balloon was inserted without difficulty. Anchoring needles were placed within the prostate. Real-time contouring of the urethra prostate and rectum were performed and the dosing parameters were established. Targeted dose was 145 gray. We then came to the operating suite suite for placement of the needles. A second timeout was performed. All needle passage was done with real-time transrectal ultrasound guidance with the sagittal plane. A total of 23 needles were placed. See implantation itself was done with the robotic implanter. 71 active seeds were implanted. A Foley catheter was removed and flexible cystoscopy was performed.  We noted one active seed and 2 spacers within the bladder.  These foreign bodies were extracted with a grasper. The Foley catheter was inserted which  drained clear urine. The patient was brought to recovery room in stable condition.

## 2013-10-20 NOTE — Anesthesia Postprocedure Evaluation (Signed)
  Anesthesia Post-op Note  Patient: Martin Murphy  Procedure(s) Performed: Procedure(s): RADIOACTIVE SEED IMPLANT (N/A)  Patient Location: PACU  Anesthesia Type:General  Level of Consciousness: awake, alert  and oriented  Airway and Oxygen Therapy: Patient Spontanous Breathing  Post-op Pain: none  Post-op Assessment: Post-op Vital signs reviewed  Post-op Vital Signs: Reviewed  Last Vitals:  Filed Vitals:   10/20/13 1045  BP: 125/72  Pulse: 75  Temp:   Resp: 15    Complications: No apparent anesthesia complications

## 2013-10-20 NOTE — Anesthesia Procedure Notes (Addendum)
Procedure Name: Intubation Date/Time: 10/20/2013 7:41 AM Performed by: Mechele Claude Pre-anesthesia Checklist: Patient identified, Emergency Drugs available, Suction available and Patient being monitored Patient Re-evaluated:Patient Re-evaluated prior to inductionOxygen Delivery Method: Circle System Utilized Preoxygenation: Pre-oxygenation with 100% oxygen Intubation Type: IV induction and Cricoid Pressure applied Ventilation: Mask ventilation without difficulty Laryngoscope Size: Mac and 4 Grade View: Grade I Tube type: Oral Tube size: 8.0 mm Number of attempts: 1 Airway Equipment and Method: stylet Placement Confirmation: ETT inserted through vocal cords under direct vision,  positive ETCO2 and breath sounds checked- equal and bilateral Secured at: 23 cm Tube secured with: Tape Dental Injury: Teeth and Oropharynx as per pre-operative assessment

## 2013-10-20 NOTE — Discharge Instructions (Signed)
DISCHARGE INSTRUCTIONS FOR PROSTATE SEED IMPLANTATION ° °Removal of catheter °Remove the foley catheter after 24 hours ( day after the procedure).can be done easily by cutting the side port of the catheter, whichallow the balloon to deflate.  You will see 1-2 teaspoons of clear water as the balloon deflates and then the catheter can be slid out without difficulty. ° ° °     Cut here ° °Antibiotics °You may be given a prescription for an antibiotic to take when you arrive home. If so, be sure to take every tablet in the bottle, even if you are feeling better before the prescription is finished. If you begin itching, notice a rash or start to swell on your trunk, arms, legs and/or throat, immediately stop taking the antibiotic and call your Urologist. °Diet °Resume your usual diet when you return home. To keep your bowels moving easily and softly, drink prune, apple and cranberry juice at room temperature. You may also take a stool softener, such as Colace, which is available without prescription at local pharmacies. °Daily activities °  No driving or heavy lifting for at least two days after the implant. °  No bike riding, horseback riding or riding lawn mowers for the first month after the implant. °  Any strenuous physical activity should be approved by your doctor before you resume it. °Sexual relations °You may resume sexual relations two weeks after the procedure. A condom should be used for the first two weeks. Your semen may be dark brown or black; this is normal and is related bleeding that may have occurred during the implant. °Postoperative swelling °Expect swelling and bruising of the scrotum and perineum (the area between the scrotum and anus). Both the swelling and the bruising should resolve in l or 2 weeks. Ice packs and over- the-counter medications such as Tylenol, Advil or Aleve may lessen your discomfort. °Postoperative urination °Most men experience burning on urination and/or urinary frequency.  If this becomes bothersome, contact your Urologist.  Medication can be prescribed to relieve these problems.  It is normal to have some blood in your urine for a few days after the implant. °Special instructions related to the seeds °It is unlikely that you will pass an Iodine-125 seed in your urine. The seeds are silver in color and are about as large as a grain of rice. If you pass a seed, do not handle it with your fingers. Use a spoon to place it in an envelope or jar in place this in base occluded area such as the garage or basement for return to the radiation clinic at your convenience. ° °Contact your doctor for °  Temperature greater than 101 F °  Increasing pain °  Inability to urinate °Follow-up ° You should have follow up with your urologist and radiation oncologist about 3 weeks after the procedure. °General information regarding Iodine seeds °  Iodine-125 is a low energy radioactive material. It is not deeply penetrating and loses energy at short distances. Your prostate will absorb the radiation. Objects that are touched or used by the patient do not become radioactive. °  Body wastes (urine and stool) or body fluids (saliva, tears, semen or blood) are not radioactive. °  The Nuclear Regulatory Commission (NRC) has determined that no radiation precautions are needed for patients undergoing Iodine-125 seed implantation. The NRC states that such patients do not present a risk to the people around them, including young children and pregnant women. However, in keeping with the general principle   that radiation exposure should be kept as low reasonably possible, we suggest the following: °  Children and pets should not sit on the patient's lap for the first two (2) weeks after the implant. °  Pregnant (or possibly pregnant) women should avoid prolonged, close contact with the patient for the first two (2) weeks after the implant. °  A distance of three (3) feet is acceptable. °At a distance of three (3)  feet, there is no limit to the length of time anyone can be with the patient. °Post Anesthesia Home Care Instructions ° °Activity: °Get plenty of rest for the remainder of the day. A responsible adult should stay with you for 24 hours following the procedure.  °For the next 24 hours, DO NOT: °-Drive a car °-Operate machinery °-Drink alcoholic beverages °-Take any medication unless instructed by your physician °-Make any legal decisions or sign important papers. ° °Meals: °Start with liquid foods such as gelatin or soup. Progress to regular foods as tolerated. Avoid greasy, spicy, heavy foods. If nausea and/or vomiting occur, drink only clear liquids until the nausea and/or vomiting subsides. Call your physician if vomiting continues. ° °Special Instructions/Symptoms: °Your throat may feel dry or sore from the anesthesia or the breathing tube placed in your throat during surgery. If this causes discomfort, gargle with warm salt water. The discomfort should disappear within 24 hours. °   °

## 2013-10-20 NOTE — Progress Notes (Signed)
[facility] Operative Procedure Note  Name: Martin Murphy MRN: [id number]  Date:   09/09/2013           DOB: February 13, 1966  Status:outpatient    BS:JGGEZMO,QHUTML R, MD  Dr. Rana Snare DIAGNOSIS: A 48 year old gentlemen with stage T1 C. adenocarcinoma of the prostate with a Gleason of 6 and a PSA of  4.51.  PROCEDURE: Insertion of radioactive I-125 seeds into the prostate gland.  RADIATION DOSE: 145 Gy, definitive therapy.  TECHNIQUE: Martin Murphy was brought to the operating room with Dr. Risa Grill. He was placed in the dorsolithotomy position. He was catheterized and a rectal tube was inserted. The perineum was shaved, prepped and draped. The ultrasound probe was then introduced into the rectum to see the prostate gland.  TREATMENT DEVICE: A needle grid was attached to the ultrasound probe stand and anchor needles were placed.  COMPLEX ISODOSE CALCULATION: The prostate was imaged in 3D using a sagittal sweep of the prostate probe. These images were transferred to the planning computer. There, the prostate, urethra and rectum were defined on each axial reconstructed image. Then, the software created an optimized plan and a few seed positions were adjusted. Then the accepted plan was uploaded to the seed Selectron afterloading unit.  SPECIAL TREATMENT PROCEDURE/SUPERVISION AND HANDLING: The Nucletron FIRST system was used to place the needles under sagittal guidance. A total of 27 needles were used to deposit 72 seeds in the prostate gland. The individual seed activity was 0.433 mCi for a total implant activity of 31.2 mCi.  COMPLEX SIMULATION: At the end of the procedure, an anterior radiograph of the pelvis was obtained to document seed positioning and count. Cystoscopy was performed to check the urethra and bladder.  On completion of the procedure one seed was extracted from the bladder/urethra for a net total of 71 seeds  implanted.   MICRODOSIMETRY: At the end of the procedure, the patient was emitting 0.09 mrem/hr at 1 meter. Accordingly, he was considered safe for hospital discharge.  PLAN: The patient will return to the radiation oncology clinic for post implant CT dosimetry in three weeks.

## 2013-10-20 NOTE — Transfer of Care (Signed)
Immediate Anesthesia Transfer of Care Note  Patient: Martin Murphy  Procedure(s) Performed: Procedure(s) (LRB): RADIOACTIVE SEED IMPLANT (N/A)  Patient Location: PACU  Anesthesia Type: General  Level of Consciousness: awake, alert  and oriented  Airway & Oxygen Therapy: Patient Spontanous Breathing and Patient connected to nasal cannula oxygen  Post-op Assessment: Report given to PACU RN and Post -op Vital signs reviewed and stable  Post vital signs: Reviewed and stable  Complications: No apparent anesthesia complications

## 2013-10-20 NOTE — OR Nursing (Signed)
572ml red urine eliminated.

## 2013-10-20 NOTE — Progress Notes (Signed)
CC Dr. Coral Ceo, Dr. Rana Snare  End of treatment summary:  Diagnosis: Stage T1c favorable risk adenocarcinoma prostate  Site/dose: Prostate 14,500 cGy implanting 72 seeds in 27 active needles. Individual seed activity 0.433 mCi per seed for a total implant activity of 31.2 mCi. On completion of the procedure one seed was extracted from the bladder/urethra for a net total of 71 seeds implanted.  Narrative: The patient appears to have undergone a successful Nucletron seed Selectron implant with Dr. Risa Grill.  Plan: Followup visit with Dr. Risa Grill and myself in 3 weeks. We will obtain a CT scan at that time to assess the quality of his implant.

## 2013-10-21 ENCOUNTER — Encounter (HOSPITAL_BASED_OUTPATIENT_CLINIC_OR_DEPARTMENT_OTHER): Payer: Self-pay | Admitting: Urology

## 2013-10-26 ENCOUNTER — Telehealth: Payer: Self-pay | Admitting: *Deleted

## 2013-10-26 NOTE — Telephone Encounter (Signed)
I was calling to see when I can schedule foot surgery.  Thanks, looking forward to your call.

## 2013-10-26 NOTE — Telephone Encounter (Signed)
I want to schedule surgery.  I asked him what date is he looking at.  She stated either the second or third of September.  He then stated, is that too soon?  I told him it's next week.  He stated let's do it on the 9th instead.  I told him the surgical center would call him a day or 2 ahead of surgery date and they will give you the arrival time.  I asked him to ask them any questions he may have in regards to medication or if he gets any signs of a cold or infection.  I asked him to go ahead and fill out the forms on-line for the surgical center, instructions are in the packet.  He said I may have lost that packet.  I told him to go to [email or web address].  He stated, I will thanks.

## 2013-10-28 ENCOUNTER — Ambulatory Visit (INDEPENDENT_AMBULATORY_CARE_PROVIDER_SITE_OTHER): Payer: 59 | Admitting: Internal Medicine

## 2013-10-28 VITALS — BP 124/70 | HR 88 | Temp 97.7°F | Resp 16 | Ht 71.0 in | Wt 198.2 lb

## 2013-10-28 DIAGNOSIS — K644 Residual hemorrhoidal skin tags: Secondary | ICD-10-CM

## 2013-10-28 DIAGNOSIS — I10 Essential (primary) hypertension: Secondary | ICD-10-CM

## 2013-10-28 HISTORY — DX: Essential (primary) hypertension: I10

## 2013-10-28 MED ORDER — HYDROCORTISONE 2.5 % RE CREA: 1.0000 "application " | TOPICAL_CREAM | Freq: Two times a day (BID) | RECTAL | Status: DC

## 2013-10-28 NOTE — Progress Notes (Signed)
   Subjective:    Patient ID: Martin Murphy, male    DOB: [birth year], 48 y.o.   MRN: [id number]  HPI 47 year old with history or tender rectal "bump" 1 week following hospital discharge following  Radiation seed implant for prostate cancer   no prior history of hemorrhoids and no rectal bleeding  Denies constipation   has never had a colonoscopy  PCP [facility]    Review of Systems  noncontributory     Objective:   Physical Exam BP 124/70  Pulse 88  Temp(Src) 97.7 F (36.5 C) (Oral)  Resp 16  Ht 5\' 11"  (1.803 m)  Wt 198 lb 3.2 oz (89.903 kg)  BMI 27.66 kg/m2  SpO2 95% Abdomen benign   at 10:00 there is a hemorrhoidal tag that is in resolution phase without tenderness --has a broad base        Assessment & Plan:  Ext throm Hemorrhoid  Meds ordered this encounter  Medications  . hydrocortisone (ANUSOL-HC) 2.5 % rectal cream    Sig: Place 1 application rectally 2 (two) times daily.    Dispense:  30 g    Refill:  1  sitz baths Band if no resol in 2-4 weeks Ref back to PCP [facility] for 48yo hea;lth main. incl colonos#1

## 2013-11-02 ENCOUNTER — Telehealth: Payer: Self-pay | Admitting: *Deleted

## 2013-11-02 NOTE — Telephone Encounter (Signed)
Message copied by Lolita Rieger on Wed Nov 02, 2013  5:03 PM ------      Message from: [place], [place] M      Created: Wed Nov 02, 2013  4:28 PM      Regarding: Surgical Packet      Contact: [phone]       Martin Murphy this patient lost his surgical packet that he needs to fill out prior to surgery. Wants to know how to go about getting a new packet. Please call back. Thank you. ------

## 2013-11-02 NOTE — Telephone Encounter (Signed)
I called and informed him he can come by the office to pick up another packet.  He said he would come by tomorrow morning to pick it up.  I told him I will put it up front for him.  He stated thank you.

## 2013-11-09 ENCOUNTER — Ambulatory Visit: Payer: 59 | Admitting: Radiation Oncology

## 2013-11-09 DIAGNOSIS — M2010 Hallux valgus (acquired), unspecified foot: Secondary | ICD-10-CM

## 2013-11-15 ENCOUNTER — Telehealth: Payer: Self-pay | Admitting: *Deleted

## 2013-11-15 NOTE — Telephone Encounter (Signed)
Called patient to remind of appts. For 11-16-13, spoke with patient and he will be here.

## 2013-11-16 ENCOUNTER — Ambulatory Visit (INDEPENDENT_AMBULATORY_CARE_PROVIDER_SITE_OTHER): Payer: 59 | Admitting: Podiatrist

## 2013-11-16 ENCOUNTER — Ambulatory Visit
Admission: RE | Admit: 2013-11-16 | Discharge: 2013-11-16 | Disposition: A | Discharge status: Home / Self Care | Payer: 59 | Source: Ambulatory Visit | Attending: Radiation Oncology | Admitting: Radiation Oncology

## 2013-11-16 ENCOUNTER — Encounter: Payer: Self-pay | Admitting: Radiation Oncology

## 2013-11-16 ENCOUNTER — Ambulatory Visit: Payer: 59 | Admitting: Radiation Oncology

## 2013-11-16 ENCOUNTER — Ambulatory Visit (INDEPENDENT_AMBULATORY_CARE_PROVIDER_SITE_OTHER): Payer: 59

## 2013-11-16 ENCOUNTER — Encounter: Payer: Self-pay | Admitting: Podiatrist

## 2013-11-16 VITALS — BP 148/88 | HR 91 | Resp 16

## 2013-11-16 VITALS — BP 122/83 | HR 91 | Temp 98.2°F | Resp 20

## 2013-11-16 DIAGNOSIS — Z791 Long term (current) use of non-steroidal anti-inflammatories (NSAID): Secondary | ICD-10-CM | POA: Diagnosis not present

## 2013-11-16 DIAGNOSIS — M2010 Hallux valgus (acquired), unspecified foot: Secondary | ICD-10-CM

## 2013-11-16 DIAGNOSIS — I10 Essential (primary) hypertension: Secondary | ICD-10-CM | POA: Insufficient documentation

## 2013-11-16 DIAGNOSIS — Z8042 Family history of malignant neoplasm of prostate: Secondary | ICD-10-CM | POA: Insufficient documentation

## 2013-11-16 DIAGNOSIS — Z510 Encounter for antineoplastic radiation therapy: Secondary | ICD-10-CM | POA: Insufficient documentation

## 2013-11-16 DIAGNOSIS — C61 Malignant neoplasm of prostate: Secondary | ICD-10-CM

## 2013-11-16 DIAGNOSIS — Z9889 Other specified postprocedural states: Secondary | ICD-10-CM

## 2013-11-16 DIAGNOSIS — M2012 Hallux valgus (acquired), left foot: Secondary | ICD-10-CM

## 2013-11-16 NOTE — Progress Notes (Signed)
Complex simulation note: The patient was taken to the CT simulator. His pelvis was scanned. The CT data set was sent to the [facility] system for contouring of his prostate and rectum. He'll then undergo 3-D simulation to assess the quality of his implant. He appears to have an excellent seed distribution.

## 2013-11-16 NOTE — Progress Notes (Signed)
Patient denies pain, fatigue, loss of appetite, bowel issues. He states his urinary stream is weak, slow to start, and he has urinary urgency. He has nocturia x 2 but states this is his usual. Pt denies other urinary issues. He is recovering from bunion surgery on his left foot and is wearing a boot.

## 2013-11-16 NOTE — Progress Notes (Signed)
CC: Dr. Rana Snare, Dr. 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 He is doing reasonably well from a GU and GI standpoint although his urinary stream is slightly weak. He does have occasional urinary urgency. He has nocturia x2 and this is his baseline. He visited [facility] to see a nurse practitioner last week and I expect he will see Dr. Risa Grill in 2-3 months for a followup visit and PSA determination.  Physical examination: Alert and oriented. Filed Vitals:   11/16/13 1023  BP: 122/83  Pulse: 91  Temp: 98.2 F (36.8 C)  Resp: 20   Rectal examination not performed today.  His CT scan today shows what appears to be an excellent seed distribution. I reviewed his scan with the patient.  Impression: Satisfactory progress with what appears to be excellent post implant dosimetry.  Plan: We will move ahead with his post implant dosimetry and forward the results to Dr. Risa Grill. I have not scheduled the patient for a formal followup visit and I ask that Dr. Risa Grill keep me posted on his progress and PSA determinations.

## 2013-11-18 DIAGNOSIS — Z510 Encounter for antineoplastic radiation therapy: Secondary | ICD-10-CM | POA: Diagnosis not present

## 2013-11-19 ENCOUNTER — Encounter: Payer: Self-pay | Admitting: Radiation Oncology

## 2013-11-19 NOTE — Progress Notes (Signed)
CC: Dr. Rana Snare, Dr. Gaynelle Arabian  Post implant CT dosimetry note:  Mr. Stuhr completed post implant CT dosimetry on 11/18/2013 to assess the quality of his prostate seed implant. His intraoperative prostate volume by ultrasound was 37.3 cc and his postoperative prostate volume by CT was 35.7 cc, a good correlation. Dose volume histograms were obtained for the prostate and rectum. His prostate D 90 is 119.5% and V100 97.6%, both excellent. Only 0.25 cc of rectum received the prescribed dose of 14,500 cGy. In summary, the patient has excellent dosimetry with a low risk for late rectal toxicity.

## 2013-11-22 ENCOUNTER — Telehealth: Payer: Self-pay | Admitting: Dietician

## 2013-11-22 NOTE — Telephone Encounter (Signed)
Brief Outpatient Oncology Nutrition Note  Patient has been identified to be at risk on malnutrition screen.  Wt Readings from Last 10 Encounters:  10/28/13 198 lb 3.2 oz (89.903 kg)  10/20/13 201 lb (91.173 kg)  10/20/13 201 lb (91.173 kg)  10/14/13 208 lb (94.348 kg)  05/31/13 198 lb 12.8 oz (90.175 kg)  03/24/12 204 lb 3.2 oz (92.625 kg)  02/24/12 199 lb 8 oz (90.493 kg)  01/15/12 206 lb 3.2 oz (93.532 kg)  05/14/11 203 lb (92.08 kg)      Dx:  Prostate cancer  Called patient due to weight loss.  Patient reports eating well overall.  Had food surgery after his seed implant and was not eating as well for a while secondary to difficulty getting around and patient is in charge of his own meals.    [place] RD available as needed.  Antonieta Iba, RD, LDN

## 2013-11-23 ENCOUNTER — Encounter: Payer: Self-pay | Admitting: Podiatrist

## 2013-11-23 ENCOUNTER — Ambulatory Visit (INDEPENDENT_AMBULATORY_CARE_PROVIDER_SITE_OTHER): Payer: 59 | Admitting: Podiatrist

## 2013-11-23 VITALS — BP 144/82 | HR 88 | Resp 13

## 2013-11-23 DIAGNOSIS — Z9889 Other specified postprocedural states: Secondary | ICD-10-CM

## 2013-11-23 NOTE — Progress Notes (Signed)
   Subjective: Patient presents today2 weeks status post foot surgery of the left foot.  Date of surgery 11-09-13- austin bunion left foot. Patient denies nausea, vomiting, fevers, chills or night sweats.  Denies calf pain or tenderness to the operative side.  Objective:  Neurovascular status is intact with palpable pedal pulses DP and PT bilateral at 2+ out of 4. Neurological sensation is intact and unchanged as per prior to surgery. Excellent appearance of the postoperative foot is noted.   Assessment: Status post austin bunion correction left foot   Plan:  Sutures removed and patient given instructions for aftercare-- recommend use of an anklet and post op shoe.  He will start at home physical therapy with range of motion.  We discussed fixing the right bunion-- he would like to see if we can fix it without a screw.  I will look at the xrays and let him know at his next post op visit

## 2013-11-24 ENCOUNTER — Telehealth: Payer: Self-pay | Admitting: *Deleted

## 2013-11-24 NOTE — Progress Notes (Signed)
Subjective: Patient presents today1 week status post foot surgery of the left foot.  Date of surgery 11-09-13- austin bunion correction. Patient denies nausea, vomiting, fevers, chills or night sweats.  Denies calf pain or tenderness to the operative side.  Objective:  Neurovascular status is intact with palpable pedal pulses DP and PT bilateral at 2+ out of 4. Neurological sensation is intact and unchanged as per prior to surgery. Excellent appearance of the postoperative foot is noted.   Assessment: Status post austin bunion correction left foot  Plan:  Redressed the foot with a dry dressing.  He will stay in his boot-- i will see him back next week for suture removal.  He will keep the foot dry until instructed to do otherwise.

## 2013-11-24 NOTE — Telephone Encounter (Signed)
On 11-24-13 fax form back to [employer] that Dr. Valere Dross fill out and fax note from 11-16-13.

## 2013-12-14 ENCOUNTER — Ambulatory Visit (INDEPENDENT_AMBULATORY_CARE_PROVIDER_SITE_OTHER): Payer: 59

## 2013-12-14 ENCOUNTER — Ambulatory Visit (INDEPENDENT_AMBULATORY_CARE_PROVIDER_SITE_OTHER): Payer: 59 | Admitting: Podiatrist

## 2013-12-14 ENCOUNTER — Encounter: Payer: Self-pay | Admitting: Podiatrist

## 2013-12-14 VITALS — BP 126/89 | HR 100 | Resp 16

## 2013-12-14 DIAGNOSIS — M2012 Hallux valgus (acquired), left foot: Secondary | ICD-10-CM

## 2013-12-14 DIAGNOSIS — Z9889 Other specified postprocedural states: Secondary | ICD-10-CM

## 2013-12-14 NOTE — Patient Instructions (Signed)
Stay in your big boot for the next 3 weeks-- we'll recheck in 3-4 to see how the swelling is doing.

## 2013-12-19 ENCOUNTER — Other Ambulatory Visit (HOSPITAL_BASED_OUTPATIENT_CLINIC_OR_DEPARTMENT_OTHER): Payer: 59

## 2013-12-19 DIAGNOSIS — D472 Monoclonal gammopathy: Secondary | ICD-10-CM

## 2013-12-19 LAB — COMPREHENSIVE METABOLIC PANEL (CC13)
ALBUMIN: 3.4 g/dL — AB (ref 3.5–5.0)
ALT: 38 U/L (ref 0–55)
ANION GAP: 9 meq/L (ref 3–11)
AST: 36 U/L — ABNORMAL HIGH (ref 5–34)
Alkaline Phosphatase: 189 U/L — ABNORMAL HIGH (ref 40–150)
BUN: 13.2 mg/dL (ref 7.0–26.0)
CALCIUM: 9.6 mg/dL (ref 8.4–10.4)
CO2: 25 meq/L (ref 22–29)
CREATININE: 1 mg/dL (ref 0.7–1.3)
Chloride: 106 mEq/L (ref 98–109)
GLUCOSE: 111 mg/dL (ref 70–140)
Potassium: 3.8 mEq/L (ref 3.5–5.1)
Sodium: 140 mEq/L (ref 136–145)
Total Bilirubin: 0.46 mg/dL (ref 0.20–1.20)
Total Protein: 7.6 g/dL (ref 6.4–8.3)

## 2013-12-19 LAB — CBC WITH DIFFERENTIAL/PLATELET
BASO%: 0.6 % (ref 0.0–2.0)
BASOS ABS: 0 10*3/uL (ref 0.0–0.1)
EOS ABS: 0.2 10*3/uL (ref 0.0–0.5)
EOS%: 5.2 % (ref 0.0–7.0)
HEMATOCRIT: 40.9 % (ref 38.4–49.9)
HEMOGLOBIN: 12.9 g/dL — AB (ref 13.0–17.1)
LYMPH%: 28.2 % (ref 14.0–49.0)
MCH: 27.1 pg — AB (ref 27.2–33.4)
MCHC: 31.6 g/dL — ABNORMAL LOW (ref 32.0–36.0)
MCV: 85.8 fL (ref 79.3–98.0)
MONO#: 0.4 10*3/uL (ref 0.1–0.9)
MONO%: 8.7 % (ref 0.0–14.0)
NEUT%: 57.3 % (ref 39.0–75.0)
NEUTROS ABS: 2.6 10*3/uL (ref 1.5–6.5)
Platelets: 221 10*3/uL (ref 140–400)
RBC: 4.77 10*6/uL (ref 4.20–5.82)
RDW: 14.2 % (ref 11.0–14.6)
WBC: 4.4 10*3/uL (ref 4.0–10.3)
lymph#: 1.3 10*3/uL (ref 0.9–3.3)

## 2013-12-19 LAB — LACTATE DEHYDROGENASE (CC13): LDH: 139 U/L (ref 125–245)

## 2013-12-19 NOTE — Progress Notes (Signed)
Subjective: Patient presents today 4 weeks status post foot surgery of the left foot. Date of surgery 11-09-13- austin bunion left foot. Denies calf pain or tenderness to the operative side. He relates the foot continues to swell and is therefore uncomfortable.   Objective: Neurovascular status is intact with palpable pedal pulses DP and PT bilateral at 2+ out of 4. Neurological sensation is intact and unchanged as per prior to surgery. Good alignment and position status post bunion repair. There is still quite a bit of swelling around the heel foot and first metatarsal phalangeal joint. X-rays are taken and reveal the osteotomy is healing and screw fixation in good alignment and position. Continued swelling is seen.  Assessment: Status post austin bunion correction left foot -postoperative swelling  Plan: Recommended going back in the large air fracture walker to help with the swelling. Also recommended continued use of the anklet.  I will see him back for another recheck in 4 weeks. He would like to consider having his right foot done however a recommended for the left foot the heel more prior to considering surgery on the right. Patient demonstrates an understanding of this conversation and will call if any concerns arise.

## 2013-12-21 LAB — KAPPA/LAMBDA LIGHT CHAINS
KAPPA FREE LGHT CHN: 1.83 mg/dL (ref 0.33–1.94)
Kappa:Lambda Ratio: 0.95 (ref 0.26–1.65)
Lambda Free Lght Chn: 1.92 mg/dL (ref 0.57–2.63)

## 2013-12-21 LAB — IGG, IGA, IGM
IgA: 725 mg/dL — ABNORMAL HIGH (ref 68–379)
IgG (Immunoglobin G), Serum: 1190 mg/dL (ref 650–1600)
IgM, Serum: 33 mg/dL — ABNORMAL LOW (ref 41–251)

## 2013-12-21 LAB — BETA 2 MICROGLOBULIN, SERUM: Beta-2 Microglobulin: 2.88 mg/L — ABNORMAL HIGH (ref <=2.51)

## 2013-12-26 ENCOUNTER — Ambulatory Visit: Payer: 59 | Admitting: Internal Medicine

## 2013-12-28 ENCOUNTER — Telehealth: Payer: Self-pay | Admitting: Internal Medicine

## 2013-12-28 ENCOUNTER — Ambulatory Visit (HOSPITAL_BASED_OUTPATIENT_CLINIC_OR_DEPARTMENT_OTHER): Payer: 59 | Admitting: Internal Medicine

## 2013-12-28 VITALS — BP 148/91 | HR 75 | Temp 97.7°F | Resp 19 | Ht 71.0 in | Wt 211.6 lb

## 2013-12-28 DIAGNOSIS — R748 Abnormal levels of other serum enzymes: Secondary | ICD-10-CM

## 2013-12-28 DIAGNOSIS — K7689 Other specified diseases of liver: Secondary | ICD-10-CM | POA: Insufficient documentation

## 2013-12-28 DIAGNOSIS — D472 Monoclonal gammopathy: Secondary | ICD-10-CM

## 2013-12-28 DIAGNOSIS — R768 Other specified abnormal immunological findings in serum: Secondary | ICD-10-CM

## 2013-12-28 NOTE — Telephone Encounter (Signed)
Gave avs & cal for April 2016. °

## 2013-12-28 NOTE — Progress Notes (Signed)
[place] [phone]   [phone]  OFFICE PROGRESS NOTE  Simona Huh, MD [address] [place] [address]  DIAGNOSIS: Monoclonal gammopathy of undetermined significance  PRIOR THERAPY: None  CURRENT THERAPY: Observation.   INTERVAL HISTORY: Martin Murphy 48 y.o. male returns to the clinic today for followup visit. The patient is feeling fine today with no specific complaints except for pain in his feet. He is status post austin bunion correction left foot. He denied having any significant chest pain, shortness of breath, cough or hemoptysis. The patient denied having any nausea or vomiting. He has no significant weight loss or night sweats. He has several studies performed recently including myeloma panel and he is here for evaluation and discussion of his lab results.  MEDICAL HISTORY: Past Medical History  Diagnosis Date  . Hypertension   . 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 Prostate cancer DX  12/20/2012    biopsies x 2, Gleason 3+3=6  vol 36  . Bunion     bilateral great toe  . Nocturia   . At risk for sleep apnea     STOP-BANG=5    SENT TO PCP 10-13-2013    ALLERGIES:  has No Known Allergies.  MEDICATIONS:  Current Outpatient Prescriptions  Medication Sig Dispense Refill  . hydrochlorothiazide (HYDRODIURIL) 25 MG tablet Take 25 mg by mouth daily.      . hydrocortisone (ANUSOL-HC) 2.5 % rectal cream Place 1 application rectally 2 (two) times daily.  30 g  1  . Multiple Vitamin (MULTIVITAMIN) tablet Take 2 tablets by mouth daily.       Marland Kitchen oxyCODONE-acetaminophen (PERCOCET) 7.5-325 MG per tablet        No current facility-administered medications for this visit.    SURGICAL HISTORY:  Past Surgical History  Procedure Laterality Date  . Prostate biopsy  12/20/12    gleason 6  . Prostate biopsy  06/07/13    gleason 6, vol 36 gms  . Inguinal hernia repair Bilateral right  02-09-2012/   left   1990's  . Bronchoscopy  1990's    w/  biopsy's  . Radioactive seed implant N/A 10/20/2013    Procedure: RADIOACTIVE SEED IMPLANT;  Surgeon: Bernestine Amass, MD;  Location: [facility];  Service: Urology;  Laterality: N/A;  . Bunionectomy      REVIEW OF SYSTEMS:  A comprehensive review of systems was negative except for: Pain in his feet   PHYSICAL EXAMINATION: General appearance: alert, cooperative and no distress Head: Normocephalic, without obvious abnormality, atraumatic Neck: no adenopathy, no JVD, supple, symmetrical, trachea midline and thyroid not enlarged, symmetric, no tenderness/mass/nodules Lymph nodes: Cervical, supraclavicular, and axillary nodes normal. Resp: clear to auscultation bilaterally Back: symmetric, no curvature. ROM normal. No CVA tenderness. Cardio: regular rate and rhythm, S1, S2 normal, no murmur, click, rub or gallop GI: soft, non-tender; bowel sounds normal; no masses,  no organomegaly Extremities: extremities normal, atraumatic, no cyanosis or edema  ECOG PERFORMANCE STATUS: 0 - Asymptomatic  There were no vitals taken for this visit.  LABORATORY DATA: Lab Results  Component Value Date   WBC 4.4 12/19/2013   HGB 12.9* 12/19/2013   HCT 40.9 12/19/2013   MCV 85.8 12/19/2013   PLT 221 12/19/2013      Chemistry      Component Value Date/Time   NA 140 12/19/2013 1537   NA 141 10/13/2013 1309  K 3.8 12/19/2013 1537   K 4.4 10/13/2013 1309   CL 102 10/13/2013 1309   CO2 25 12/19/2013 1537   CO2 26 10/13/2013 1309   BUN 13.2 12/19/2013 1537   BUN 16 10/13/2013 1309   CREATININE 1.0 12/19/2013 1537   CREATININE 1.13 10/13/2013 1309      Component Value Date/Time   CALCIUM 9.6 12/19/2013 1537   CALCIUM 10.0 10/13/2013 1309   ALKPHOS 189* 12/19/2013 1537   ALKPHOS 213* 10/13/2013 1309   AST 36* 12/19/2013 1537   AST 46* 10/13/2013 1309   ALT 38 12/19/2013 1537   ALT 56* 10/13/2013 1309   BILITOT 0.46 12/19/2013 1537   BILITOT 0.3  10/13/2013 1309     Other lab results: Beta-2 microglobulin 2.88, IgG 1190, IgA 725 and IgM 33. Kappa light chain 1.83, free lambda light chain  1.92 with a kappa/lambda ratio of 0.95  RADIOGRAPHIC STUDIES: No results found.  ASSESSMENT AND PLAN: This is a very pleasant 48 years old [place] American male with minor elevation of IgA most likely inflammatory in origin but I cannot rule out monoclonal gammopathy of undetermined significance. His myeloma panel looks much better on the recent blood work. I discussed the lab result with the patient today. I recommended for him to continue on observation with repeat myeloma panel in 6 months. The patient has persistent elevation of the liver enzymes of unknown etiology. I will order ultrasound of the abdomen to rule out any underlying liver or gallbladder abnormalities. I will call him with the result and recommendation if there is any significant issues. He was advised to call immediately if he has any concerning symptoms in the interval. The patient voices understanding of current disease status and treatment options and is in agreement with the current care plan.  All questions were answered. The patient knows to call the clinic with any problems, questions or concerns. We can certainly see the patient much sooner if necessary.  Disclaimer: This note was dictated with voice recognition software. Similar sounding words can inadvertently be transcribed and may not be corrected upon review.

## 2014-01-04 ENCOUNTER — Ambulatory Visit (INDEPENDENT_AMBULATORY_CARE_PROVIDER_SITE_OTHER): Payer: 59

## 2014-01-04 ENCOUNTER — Ambulatory Visit (INDEPENDENT_AMBULATORY_CARE_PROVIDER_SITE_OTHER): Payer: 59 | Admitting: Podiatrist

## 2014-01-04 ENCOUNTER — Encounter: Payer: Self-pay | Admitting: Podiatrist

## 2014-01-04 ENCOUNTER — Ambulatory Visit (HOSPITAL_COMMUNITY)
Admission: RE | Admit: 2014-01-04 | Discharge: 2014-01-04 | Disposition: A | Discharge status: Home / Self Care | Payer: 59 | Source: Ambulatory Visit | Attending: Body Imaging | Admitting: Body Imaging

## 2014-01-04 VITALS — BP 127/79 | HR 99 | Resp 16

## 2014-01-04 DIAGNOSIS — Z9889 Other specified postprocedural states: Secondary | ICD-10-CM

## 2014-01-04 DIAGNOSIS — Z8546 Personal history of malignant neoplasm of prostate: Secondary | ICD-10-CM | POA: Insufficient documentation

## 2014-01-04 DIAGNOSIS — N281 Cyst of kidney, acquired: Secondary | ICD-10-CM | POA: Insufficient documentation

## 2014-01-04 DIAGNOSIS — M2012 Hallux valgus (acquired), left foot: Secondary | ICD-10-CM

## 2014-01-04 DIAGNOSIS — M21612 Bunion of left foot: Secondary | ICD-10-CM

## 2014-01-04 DIAGNOSIS — D472 Monoclonal gammopathy: Secondary | ICD-10-CM | POA: Diagnosis not present

## 2014-01-04 DIAGNOSIS — K7689 Other specified diseases of liver: Secondary | ICD-10-CM | POA: Diagnosis present

## 2014-01-04 NOTE — Progress Notes (Signed)
Subjective: Patient presents today 8 weeks status post foot surgery of the left foot. Date of surgery 11-09-13- austin bunion left foot. Patient denies nausea, vomiting, fevers, chills or night sweats. Denies calf pain or tenderness to the operative side.he has been wearing his postoperative boot and following instructions.  Objective: Neurovascular status is intact with palpable pedal pulses DP and PT bilateral at 2+ out of 4. Neurological sensation is intact and unchanged as per prior to surgery. Excellent appearance of the postoperative foot is noted. Moderate swelling as seen on x-ray. Dorsal osteotomy is still healing. Right foot also has a bunion deformity present both clinically and on x-ray.  Assessment: Status post austin bunion correction left foot;  Hallux abductovalgus deformity right foot  Plan: discussed continuation of a good supportive shoe. He is not ready to resume any activity until that osteotomy completely heals. He would like to consider having the right foot fixed. Discussed a bunion correction with screw fixation similar to that which we did on the left foot. He would like to consider having this done and will call if he would like to schedule surgery. Surgery will be performed out patient agrees for especially surgery Center at his convenience.

## 2014-01-04 NOTE — Patient Instructions (Signed)
Pre-Operative Instructions  Congratulations, you have decided to take an important step to improving your quality of life.  You can be assured that the doctors of [facility] will be with you every step of the way.  1. Plan to be at the surgery center/hospital at least 1 (one) hour prior to your scheduled time unless otherwise directed by the surgical center/hospital staff.  You must have a responsible adult accompany you, remain during the surgery and drive you home.  Make sure you have directions to the surgical center/hospital and know how to get there on time. 2. For hospital based surgery you will need to obtain a history and physical form from your family physician within 1 month prior to the date of surgery- we will give you a form for you primary physician.  3. We make every effort to accommodate the date you request for surgery.  There are however, times where surgery dates or times have to be moved.  We will contact you as soon as possible if a change in schedule is required.   4. No Aspirin/Ibuprofen for one week before surgery.  If you are on aspirin, any non-steroidal anti-inflammatory medications (Mobic, Aleve, Ibuprofen) you should stop taking it 7 days prior to your surgery.  You make take Tylenol  For pain prior to surgery.  5. Medications- If you are taking daily heart and blood pressure medications, seizure, reflux, allergy, asthma, anxiety, pain or diabetes medications, make sure the surgery center/hospital is aware before the day of surgery so they may notify you which medications to take or avoid the day of surgery. 6. No food or drink after midnight the night before surgery unless directed otherwise by surgical center/hospital staff. 7. No alcoholic beverages 24 hours prior to surgery.  No smoking 24 hours prior to or 24 hours after surgery. 8. Wear loose pants or shorts- loose enough to fit over bandages, boots, and casts. 9. No slip on shoes, sneakers are best. 10. Bring  your boot with you to the surgery center/hospital.  Also bring crutches or a walker if your physician has prescribed it for you.  If you do not have this equipment, it will be provided for you after surgery. 11. If you have not been contracted by the surgery center/hospital by the day before your surgery, call to confirm the date and time of your surgery. 12. Leave-time from work may vary depending on the type of surgery you have.  Appropriate arrangements should be made prior to surgery with your employer. 13. Prescriptions will be provided immediately following surgery by your doctor.  Have these filled as soon as possible after surgery and take the medication as directed. 14. Remove nail polish on the operative foot. 15. Wash the night before surgery.  The night before surgery wash the foot and leg well with the antibacterial soap provided and water paying special attention to beneath the toenails and in between the toes.  Rinse thoroughly with water and dry well with a towel.  Perform this wash unless told not to do so by your physician.  Enclosed: 1 Ice pack (please put in freezer the night before surgery)   1 Hibiclens skin cleaner   Pre-op Instructions  If you have any questions regarding the instructions, do not hesitate to call our office.  [employer]: [address] [place], [place] [address] [phone]  [place]: [address] [place], [place] [address] [phone]  [place]: [address]  [place], [place] [address] [phone]  Dr. Richard   Tuchman DPM, Dr. Norman Regal DPM Dr. Richard Sikora DPM, Dr. M. Todd Hyatt DPM, Dr. Korra Christine DPM 

## 2014-02-01 ENCOUNTER — Ambulatory Visit (INDEPENDENT_AMBULATORY_CARE_PROVIDER_SITE_OTHER): Payer: 59 | Admitting: Podiatrist

## 2014-02-01 ENCOUNTER — Encounter: Payer: Self-pay | Admitting: Podiatrist

## 2014-02-01 ENCOUNTER — Ambulatory Visit (INDEPENDENT_AMBULATORY_CARE_PROVIDER_SITE_OTHER): Payer: 59

## 2014-02-01 VITALS — BP 127/78 | HR 92 | Resp 12

## 2014-02-01 DIAGNOSIS — M2012 Hallux valgus (acquired), left foot: Secondary | ICD-10-CM

## 2014-02-01 MED ORDER — GABAPENTIN 300 MG PO CAPS: 300.0000 mg | ORAL_CAPSULE | Freq: Three times a day (TID) | ORAL | Status: DC

## 2014-02-01 NOTE — Patient Instructions (Signed)
For the Gabapentin (Neurontin)-- take 1 tablet before bedtime for 2-3 days.  If you haven't felt relief, take 1 tab at dinner and 1 tab at night for 1 more week.  If you need more relief, add 1 more tab in the morning and continue the regimine.

## 2014-02-05 NOTE — Progress Notes (Signed)
Subjective: Patient presents today 12 weeks status post foot surgery of the left foot. Date of surgery 11-09-13- austin bunion left foot. Patient denies nausea, vomiting, fevers, chills or night sweats. Denies calf pain or tenderness to the operative side.he has been wearing his postoperative boot and following instructions. He is also interested in having surgery on the right foot and would like to discuss this today as well.  Objective: Neurovascular status is intact with palpable pedal pulses DP and PT bilateral at 2+ out of 4. Neurological sensation is intact and unchanged as per prior to surgery. Excellent appearance of the postoperative foot is noted. Moderate swelling as seen on x-ray. Dorsal osteotomy is still noted to be healing. Right foot also has a bunion deformity present both clinically and on x-ray.  Right foot has a mild-to-moderate bunion deformity with increased intermetatarsal angle. Neurovascular status is intact on the right foot as well.  Assessment: Status post austin bunion correction left foot;  Hallux abductovalgus deformity right foot  Plan: He is able to get into a good supportive running shoe. Also discussed conservative versus surgical options for the right foot. Recommended a bunion correction with screw fixation. The consent form was discussed and all three pages were signed and the patient's questions were encouraged and answered to the best of my ability. Risks of the surgery were discussed including but not limited to continued pain, infection, swelling, elevated toe, decreased range of motion,  suture or implant reaction, bleeding, decreased function, etc. Preoperative instructions were also dispensed to the patient as well as a preoperative surgical pamphlet to go along with the instructions. Surgery will be scheduled at the patients convenience and patient will be seen at [facility] specialty surgery center on outpatient basis.The patient is instructed to call if any  questions or concerns arise.

## 2014-02-07 ENCOUNTER — Telehealth: Payer: Self-pay | Admitting: *Deleted

## 2014-02-07 NOTE — Telephone Encounter (Signed)
Per Dr. Valentina Lucks, I called and informed him that we can do the surgery tomorrow or can stick with the 30th.  "Oh no, I can't do it tomorrow.  I thought she was going to say like the 28th or 29th.  She doesn't have anything available then?"  I told him no sir.  "I'll just stick with the 30th then."

## 2014-02-07 NOTE — Telephone Encounter (Signed)
-----   Message from Bronson Ing, DPM sent at 02/01/2014  3:51 PM EST ----- Regarding: surgery date I saw Martin Murphy today-- please call and let him know that they still have him on for the 30th at 10am.  There is room for him to get done on Dec 8th if he would like that day instead.. Otherwise, we will stick with the 30th.  Thanks!!

## 2014-02-14 ENCOUNTER — Telehealth: Payer: Self-pay | Admitting: *Deleted

## 2014-02-14 NOTE — Telephone Encounter (Signed)
"  Calling to verify that the patient is having surgery on 03/01/2014 by Dr. Tim Lair.  Is this correct?"  Yes, that is correct.  "What procedure is he having?"  He is having an [employer].  "What's the CPT and ICD-10 codes?"  The procedure code is [phone] and the ICD-10 code is M20.10.  "Is it going to be done at [facility] like the first time?"  Yes it is.  "When is his post-op visit scheduled?"  He is scheduled for 03/08/2014.  "Okay, that's all I need.  Thank you so much."

## 2014-02-21 ENCOUNTER — Telehealth: Payer: Self-pay | Admitting: *Deleted

## 2014-02-21 NOTE — Telephone Encounter (Signed)
Pt states he received a call from Caren Griffins at a phone number that was no our office, asking to reschedule his foot surgery with Dr. Valentina Lucks to 02/27/2014.  I spoke with Mammie Russian, she states there was a conflict with the [place] schedule and ask to reschedule to 02/28/2104.  Pt states he will reschedule to 02/27/2014 and I encouraged him to call Caren Griffins at [facility].

## 2014-02-27 DIAGNOSIS — M2011 Hallux valgus (acquired), right foot: Secondary | ICD-10-CM

## 2014-03-08 ENCOUNTER — Encounter: Payer: Self-pay | Admitting: Podiatrist

## 2014-03-08 ENCOUNTER — Ambulatory Visit (INDEPENDENT_AMBULATORY_CARE_PROVIDER_SITE_OTHER): Payer: 59

## 2014-03-08 ENCOUNTER — Ambulatory Visit (INDEPENDENT_AMBULATORY_CARE_PROVIDER_SITE_OTHER): Payer: 59 | Admitting: Podiatrist

## 2014-03-08 VITALS — BP 132/87 | HR 89 | Resp 12

## 2014-03-08 DIAGNOSIS — Z9889 Other specified postprocedural states: Secondary | ICD-10-CM

## 2014-03-08 DIAGNOSIS — M2011 Hallux valgus (acquired), right foot: Secondary | ICD-10-CM

## 2014-03-08 DIAGNOSIS — M2012 Hallux valgus (acquired), left foot: Secondary | ICD-10-CM

## 2014-03-13 NOTE — Progress Notes (Signed)
Subjective: Martin Murphy presents today for his first follow-up regarding bunion correction of the right foot. He relates that the right foot doing great and he is very pleased with the results. He does however state that the right foot even though it was just done still feels much better than the left one does. He relates pain and soreness in the left foot where the bunion was corrected several months ago.  Objective: Neurovascular status is intact to the right foot excellent alignment and position of the first metatarsal phalangeal joint is noted. No redness, no swelling, no streaking, no drainage, no sign of infection is present. Overall alignment and position of the first metatarsophalangeal joint is in good position. X-ray showed good alignment and position on x-ray screw fixation in good position and alignment as well.  X-ray of the left foot taken at the last visit lateral view shows that the osteotomy is not completely healed screw fixation is adequate possible fracture of the capital fragment is seen.  Assessment: One-week status post bunion surgery on the right foot, status post surgery on the left foot for bunion correction  Plan: Redressed the right foot and a dry sterile compressive dressing and recommended he continue to wear his boot. We did discuss the left foot and discussed the possibility of removing the screw. I will recheck x-rays of the next visit of the left foot to see how the capital fragment looks as a last x-rays were bit grainy and difficult to fully assess. Would recommend he stay out of work for at least another 3-4 weeks to allow the right foot to fully heal and to be able to be back at work. I  will see him again in a week for recheck of both feet.

## 2014-03-22 ENCOUNTER — Encounter: Payer: Self-pay | Admitting: Podiatrist

## 2014-03-22 ENCOUNTER — Ambulatory Visit (INDEPENDENT_AMBULATORY_CARE_PROVIDER_SITE_OTHER): Payer: 59

## 2014-03-22 ENCOUNTER — Ambulatory Visit (INDEPENDENT_AMBULATORY_CARE_PROVIDER_SITE_OTHER): Payer: 59 | Admitting: Podiatrist

## 2014-03-22 VITALS — BP 142/90 | HR 89 | Resp 16

## 2014-03-22 DIAGNOSIS — Z9889 Other specified postprocedural states: Secondary | ICD-10-CM

## 2014-03-22 DIAGNOSIS — M2010 Hallux valgus (acquired), unspecified foot: Secondary | ICD-10-CM

## 2014-03-23 ENCOUNTER — Telehealth: Payer: Self-pay | Admitting: *Deleted

## 2014-03-23 NOTE — Telephone Encounter (Signed)
"  Patient stated that he is scheduled for surgery.  Can I get the date of that surgery?"  He is going to be scheduled for 03/29/2014.  "What is the procedure, ICD-10 and CPT?"  The procedure is a Removal of Fixation Deep Kwire/ Screw Left foot.  [place] 206[address] and ICD-10 is V54.0 After Care Removal.  Also Manipulation of 1st Metatarsal-Phalangeal Joint Left foot.  "Does he have a follow-up appointment scheduled?"  Not at this time but it may be on February 3.  "Great, I'll put it down as tentatively for the third."

## 2014-03-23 NOTE — Telephone Encounter (Signed)
I called and informed him that Wednesday the 27th is not available for surgery.  Can you do it on Monday the 25th?  "Monday will be fine."  Okay thank you, you should hear from the surgical center today or tomorrow.

## 2014-03-27 DIAGNOSIS — Z4889 Encounter for other specified surgical aftercare: Secondary | ICD-10-CM

## 2014-03-29 NOTE — Progress Notes (Signed)
Subjective: Mr. Ebert presents today for his second follow-up regarding bunion correction of the right foot. He relates that the right foot doing great and he is very pleased with the results.  He relates continued stiffness,  pain and soreness in the left foot where the bunion was corrected several months ago.  Objective: Neurovascular status is intact to the right foot excellent alignment and position of the first metatarsal phalangeal joint is noted. No redness, no swelling, no streaking, no drainage, no sign of infection is present. Overall alignment and position of the first metatarsophalangeal joint is in good position. X-ray showed good alignment and position on x-ray screw fixation in good position and alignment as well.  Left foot xray shows healed osteotomy with screw fixation in place.  No loosening around the screw itself noted.     Assessment:  status post bunion surgery on the right foot dos 02/27/14  status post surgery on the left foot for bunion correction  Plan: dispensed an anklet for the right foot and gave instructions for range of motion exercised.  For the left foot discussed removal of the screw with joint manipulation to increase range of motion in the joint.  The patient is interested in surgery and would like to proceed.  Risks, benefits and alternatives to surgery were explained.  We are going to remove the screw and manipulate the first mpj under anesthesia.  He will be seen at [facility] for the surgery.  If any questions or concerns arise in the meantime he will call.

## 2014-03-31 ENCOUNTER — Ambulatory Visit (INDEPENDENT_AMBULATORY_CARE_PROVIDER_SITE_OTHER): Payer: 59 | Admitting: Physician Assistant

## 2014-03-31 VITALS — BP 142/82 | HR 97 | Temp 98.5°F | Resp 18 | Ht 71.0 in | Wt 215.0 lb

## 2014-03-31 DIAGNOSIS — M545 Low back pain, unspecified: Secondary | ICD-10-CM

## 2014-03-31 DIAGNOSIS — M21619 Bunion of unspecified foot: Secondary | ICD-10-CM | POA: Insufficient documentation

## 2014-03-31 DIAGNOSIS — D472 Monoclonal gammopathy: Secondary | ICD-10-CM

## 2014-03-31 DIAGNOSIS — I10 Essential (primary) hypertension: Secondary | ICD-10-CM

## 2014-03-31 MED ORDER — CYCLOBENZAPRINE HCL 5 MG PO TABS: 5.0000 mg | ORAL_TABLET | Freq: Three times a day (TID) | ORAL | Status: DC | PRN

## 2014-03-31 MED ORDER — HYDROCHLOROTHIAZIDE 25 MG PO TABS: 25.0000 mg | ORAL_TABLET | Freq: Every day | ORAL | Status: DC

## 2014-03-31 NOTE — Progress Notes (Signed)
  Medical screening examination/treatment/procedure(s) were performed by non-physician practitioner and as supervising physician I was immediately available for consultation/collaboration.     

## 2014-03-31 NOTE — Patient Instructions (Signed)
I've refilled the hctz for 6 months. Please come back to see [place] in 5-6 months for a complete physical and we'll refill at that point.  You have labs ordered by your oncologist, please be sure to get these drawn as scheduled.  You have a muscle strain/spasm along your low back. Please take the flexeril every 8 hours as needed for the spasm. Keep in mind this will make you sleepy. Do not take the percocet at the same time as the flexeril. Apply heat as needed. Please do these light stretches 2-3 times per day once you are feeling better.   Low Back Strain with Rehab A strain is an injury in which a tendon or muscle is torn. The muscles and tendons of the lower back are vulnerable to strains. However, these muscles and tendons are very strong and require a great force to be injured. Strains are classified into three categories. Grade 1 strains cause pain, but the tendon is not lengthened. Grade 2 strains include a lengthened ligament, due to the ligament being stretched or partially ruptured. With grade 2 strains there is still function, although the function may be decreased. Grade 3 strains involve a complete tear of the tendon or muscle, and function is usually impaired. SYMPTOMS   Pain in the lower back.  Pain that affects one side more than the other.  Pain that gets worse with movement and may be felt in the hip, buttocks, or back of the thigh.  Muscle spasms of the muscles in the back.  Swelling along the muscles of the back.  Loss of strength of the back muscles.  Crackling sound (crepitation) when the muscles are touched. CAUSES  Lower back strains occur when a force is placed on the muscles or tendons that is greater than they can handle. Common causes of injury include:  Prolonged overuse of the muscle-tendon units in the lower back, usually from incorrect posture.  A single violent injury or force applied to the back. RISK INCREASES WITH:  Sports that involve twisting forces on  the spine or a lot of bending at the waist (football, rugby, weightlifting, bowling, golf, tennis, speed skating, racquetball, swimming, running, gymnastics, diving).  Poor strength and flexibility.  Failure to warm up properly before activity.  Family history of lower back pain or disk disorders.  Previous back injury or surgery (especially fusion).  Poor posture with lifting, especially heavy objects.  Prolonged sitting, especially with poor posture. PREVENTION   Learn and use proper posture when sitting or lifting (maintain proper posture when sitting, lift using the knees and legs, not at the waist).  Warm up and stretch properly before activity.  Allow for adequate recovery between workouts.  Maintain physical fitness:  Strength, flexibility, and endurance.  Cardiovascular fitness. PROGNOSIS  If treated properly, lower back strains usually heal within 6 weeks. RELATED COMPLICATIONS   Recurring symptoms, resulting in a chronic problem.  Chronic inflammation, scarring, and partial muscle-tendon tear.  Delayed healing or resolution of symptoms.  Prolonged disability. TREATMENT  Treatment first involves the use of ice and medicine, to reduce pain and inflammation. The use of strengthening and stretching exercises may help reduce pain with activity. These exercises may be performed at home or with a therapist. Severe injuries may require referral to a therapist for further evaluation and treatment, such as ultrasound. Your caregiver may advise that you wear a back brace or corset, to help reduce pain and discomfort. Often, prolonged bed rest results in greater harm then  benefit. Corticosteroid injections may be recommended. However, these should be reserved for the most serious cases. It is important to avoid using your back when lifting objects. At night, sleep on your back on a firm mattress with a pillow placed under your knees. If non-surgical treatment is unsuccessful,  surgery may be needed.  MEDICATION   If pain medicine is needed, nonsteroidal anti-inflammatory medicines (aspirin and ibuprofen), or other minor pain relievers (acetaminophen), are often advised.  Do not take pain medicine for 7 days before surgery.  Prescription pain relievers may be given, if your caregiver thinks they are needed. Use only as directed and only as much as you need.  Ointments applied to the skin may be helpful.  Corticosteroid injections may be given by your caregiver. These injections should be reserved for the most serious cases, because they may only be given a certain number of times. HEAT AND COLD  Cold treatment (icing) should be applied for 10 to 15 minutes every 2 to 3 hours for inflammation and pain, and immediately after activity that aggravates your symptoms. Use ice packs or an ice massage.  Heat treatment may be used before performing stretching and strengthening activities prescribed by your caregiver, physical therapist, or athletic trainer. Use a heat pack or a warm water soak. SEEK MEDICAL CARE IF:   Symptoms get worse or do not improve in 2 to 4 weeks, despite treatment.  You develop numbness, weakness, or loss of bowel or bladder function.  New, unexplained symptoms develop. (Drugs used in treatment may produce side effects.) EXERCISES  RANGE OF MOTION (ROM) AND STRETCHING EXERCISES - Low Back Strain Most people with lower back pain will find that their symptoms get worse with excessive bending forward (flexion) or arching at the lower back (extension). The exercises which will help resolve your symptoms will focus on the opposite motion.  Your physician, physical therapist or athletic trainer will help you determine which exercises will be most helpful to resolve your lower back pain. Do not complete any exercises without first consulting with your caregiver. Discontinue any exercises which make your symptoms worse until you speak to your caregiver.    If you have pain, numbness or tingling which travels down into your buttocks, leg or foot, the goal of the therapy is for these symptoms to move closer to your back and eventually resolve. Sometimes, these leg symptoms will get better, but your lower back pain may worsen. This is typically an indication of progress in your rehabilitation. Be very alert to any changes in your symptoms and the activities in which you participated in the 24 hours prior to the change. Sharing this information with your caregiver will allow him/her to most efficiently treat your condition.  These exercises may help you when beginning to rehabilitate your injury. Your symptoms may resolve with or without further involvement from your physician, physical therapist or athletic trainer. While completing these exercises, remember:  Restoring tissue flexibility helps normal motion to return to the joints. This allows healthier, less painful movement and activity.  An effective stretch should be held for at least 30 seconds.  A stretch should never be painful. You should only feel a gentle lengthening or release in the stretched tissue. FLEXION RANGE OF MOTION AND STRETCHING EXERCISES: STRETCH - Flexion, Single Knee to Chest   Lie on a firm bed or floor with both legs extended in front of you.  Keeping one leg in contact with the floor, bring your opposite knee to your  chest. Hold your leg in place by either grabbing behind your thigh or at your knee.  Pull until you feel a gentle stretch in your lower back. Hold __________ seconds.  Slowly release your grasp and repeat the exercise with the opposite side. Repeat __________ times. Complete this exercise __________ times per day.  STRETCH - Flexion, Double Knee to Chest   Lie on a firm bed or floor with both legs extended in front of you.  Keeping one leg in contact with the floor, bring your opposite knee to your chest.  Tense your stomach muscles to support your  back and then lift your other knee to your chest. Hold your legs in place by either grabbing behind your thighs or at your knees.  Pull both knees toward your chest until you feel a gentle stretch in your lower back. Hold __________ seconds.  Tense your stomach muscles and slowly return one leg at a time to the floor. Repeat __________ times. Complete this exercise __________ times per day.  STRETCH - Low Trunk Rotation  Lie on a firm bed or floor. Keeping your legs in front of you, bend your knees so they are both pointed toward the ceiling and your feet are flat on the floor.  Extend your arms out to the side. This will stabilize your upper body by keeping your shoulders in contact with the floor.  Gently and slowly drop both knees together to one side until you feel a gentle stretch in your lower back. Hold for __________ seconds.  Tense your stomach muscles to support your lower back as you bring your knees back to the starting position. Repeat the exercise to the other side. Repeat __________ times. Complete this exercise __________ times per day  EXTENSION RANGE OF MOTION AND FLEXIBILITY EXERCISES: STRETCH - Extension, Prone on Elbows   Lie on your stomach on the floor, a bed will be too soft. Place your palms about shoulder width apart and at the height of your head.  Place your elbows under your shoulders. If this is too painful, stack pillows under your chest.  Allow your body to relax so that your hips drop lower and make contact more completely with the floor.  Hold this position for __________ seconds.  Slowly return to lying flat on the floor. Repeat __________ times. Complete this exercise __________ times per day.  RANGE OF MOTION - Extension, Prone Press Ups  Lie on your stomach on the floor, a bed will be too soft. Place your palms about shoulder width apart and at the height of your head.  Keeping your back as relaxed as possible, slowly straighten your elbows while  keeping your hips on the floor. You may adjust the placement of your hands to maximize your comfort. As you gain motion, your hands will come more underneath your shoulders.  Hold this position __________ seconds.  Slowly return to lying flat on the floor. Repeat __________ times. Complete this exercise __________ times per day.  RANGE OF MOTION- Quadruped, Neutral Spine   Assume a hands and knees position on a firm surface. Keep your hands under your shoulders and your knees under your hips. You may place padding under your knees for comfort.  Drop your head and point your tail bone toward the ground below you. This will round out your lower back like an angry cat. Hold this position for __________ seconds.  Slowly lift your head and release your tail bone so that your back sags into a  large arch, like an old horse.  Hold this position for __________ seconds.  Repeat this until you feel limber in your lower back.  Now, find your "sweet spot." This will be the most comfortable position somewhere between the two previous positions. This is your neutral spine. Once you have found this position, tense your stomach muscles to support your lower back.  Hold this position for __________ seconds. Repeat __________ times. Complete this exercise __________ times per day.  STRENGTHENING EXERCISES - Low Back Strain These exercises may help you when beginning to rehabilitate your injury. These exercises should be done near your "sweet spot." This is the neutral, low-back arch, somewhere between fully rounded and fully arched, that is your least painful position. When performed in this safe range of motion, these exercises can be used for people who have either a flexion or extension based injury. These exercises may resolve your symptoms with or without further involvement from your physician, physical therapist or athletic trainer. While completing these exercises, remember:   Muscles can gain both the  endurance and the strength needed for everyday activities through controlled exercises.  Complete these exercises as instructed by your physician, physical therapist or athletic trainer. Increase the resistance and repetitions only as guided.  You may experience muscle soreness or fatigue, but the pain or discomfort you are trying to eliminate should never worsen during these exercises. If this pain does worsen, stop and make certain you are following the directions exactly. If the pain is still present after adjustments, discontinue the exercise until you can discuss the trouble with your caregiver. STRENGTHENING - Deep Abdominals, Pelvic Tilt  Lie on a firm bed or floor. Keeping your legs in front of you, bend your knees so they are both pointed toward the ceiling and your feet are flat on the floor.  Tense your lower abdominal muscles to press your lower back into the floor. This motion will rotate your pelvis so that your tail bone is scooping upwards rather than pointing at your feet or into the floor.  With a gentle tension and even breathing, hold this position for __________ seconds. Repeat __________ times. Complete this exercise __________ times per day.  STRENGTHENING - Abdominals, Crunches   Lie on a firm bed or floor. Keeping your legs in front of you, bend your knees so they are both pointed toward the ceiling and your feet are flat on the floor. Cross your arms over your chest.  Slightly tip your chin down without bending your neck.  Tense your abdominals and slowly lift your trunk high enough to just clear your shoulder blades. Lifting higher can put excessive stress on the lower back and does not further strengthen your abdominal muscles.  Control your return to the starting position. Repeat __________ times. Complete this exercise __________ times per day.  STRENGTHENING - Quadruped, Opposite UE/LE Lift   Assume a hands and knees position on a firm surface. Keep your hands  under your shoulders and your knees under your hips. You may place padding under your knees for comfort.  Find your neutral spine and gently tense your abdominal muscles so that you can maintain this position. Your shoulders and hips should form a rectangle that is parallel with the floor and is not twisted.  Keeping your trunk steady, lift your right hand no higher than your shoulder and then your left leg no higher than your hip. Make sure you are not holding your breath. Hold this position __________ seconds.  Continuing to keep your abdominal muscles tense and your back steady, slowly return to your starting position. Repeat with the opposite arm and leg. Repeat __________ times. Complete this exercise __________ times per day.  STRENGTHENING - Lower Abdominals, Double Knee Lift  Lie on a firm bed or floor. Keeping your legs in front of you, bend your knees so they are both pointed toward the ceiling and your feet are flat on the floor.  Tense your abdominal muscles to brace your lower back and slowly lift both of your knees until they come over your hips. Be certain not to hold your breath.  Hold __________ seconds. Using your abdominal muscles, return to the starting position in a slow and controlled manner. Repeat __________ times. Complete this exercise __________ times per day.  POSTURE AND BODY MECHANICS CONSIDERATIONS - Low Back Strain Keeping correct posture when sitting, standing or completing your activities will reduce the stress put on different body tissues, allowing injured tissues a chance to heal and limiting painful experiences. The following are general guidelines for improved posture. Your physician or physical therapist will provide you with any instructions specific to your needs. While reading these guidelines, remember:  The exercises prescribed by your provider will help you have the flexibility and strength to maintain correct postures.  The correct posture provides  the best environment for your joints to work. All of your joints have less wear and tear when properly supported by a spine with good posture. This means you will experience a healthier, less painful body.  Correct posture must be practiced with all of your activities, especially prolonged sitting and standing. Correct posture is as important when doing repetitive low-stress activities (typing) as it is when doing a single heavy-load activity (lifting). RESTING POSITIONS Consider which positions are most painful for you when choosing a resting position. If you have pain with flexion-based activities (sitting, bending, stooping, squatting), choose a position that allows you to rest in a less flexed posture. You would want to avoid curling into a fetal position on your side. If your pain worsens with extension-based activities (prolonged standing, working overhead), avoid resting in an extended position such as sleeping on your stomach. Most people will find more comfort when they rest with their spine in a more neutral position, neither too rounded nor too arched. Lying on a non-sagging bed on your side with a pillow between your knees, or on your back with a pillow under your knees will often provide some relief. Keep in mind, being in any one position for a prolonged period of time, no matter how correct your posture, can still lead to stiffness. PROPER SITTING POSTURE In order to minimize stress and discomfort on your spine, you must sit with correct posture. Sitting with good posture should be effortless for a healthy body. Returning to good posture is a gradual process. Many people can work toward this most comfortably by using various supports until they have the flexibility and strength to maintain this posture on their own. When sitting with proper posture, your ears will fall over your shoulders and your shoulders will fall over your hips. You should use the back of the chair to support your upper  back. Your lower back will be in a neutral position, just slightly arched. You may place a small pillow or folded towel at the base of your lower back for support.  When working at a desk, create an environment that supports good, upright posture. Without extra support, muscles tire,  which leads to excessive strain on joints and other tissues. Keep these recommendations in mind: CHAIR:  A chair should be able to slide under your desk when your back makes contact with the back of the chair. This allows you to work closely.  The chair's height should allow your eyes to be level with the upper part of your monitor and your hands to be slightly lower than your elbows. BODY POSITION  Your feet should make contact with the floor. If this is not possible, use a foot rest.  Keep your ears over your shoulders. This will reduce stress on your neck and lower back. INCORRECT SITTING POSTURES  If you are feeling tired and unable to assume a healthy sitting posture, do not slouch or slump. This puts excessive strain on your back tissues, causing more damage and pain. Healthier options include:  Using more support, like a lumbar pillow.  Switching tasks to something that requires you to be upright or walking.  Talking a brief walk.  Lying down to rest in a neutral-spine position. PROLONGED STANDING WHILE SLIGHTLY LEANING FORWARD  When completing a task that requires you to lean forward while standing in one place for a long time, place either foot up on a stationary 2-4 inch high object to help maintain the best posture. When both feet are on the ground, the lower back tends to lose its slight inward curve. If this curve flattens (or becomes too large), then the back and your other joints will experience too much stress, tire more quickly, and can cause pain. CORRECT STANDING POSTURES Proper standing posture should be assumed with all daily activities, even if they only take a few moments, like when  brushing your teeth. As in sitting, your ears should fall over your shoulders and your shoulders should fall over your hips. You should keep a slight tension in your abdominal muscles to brace your spine. Your tailbone should point down to the ground, not behind your body, resulting in an over-extended swayback posture.  INCORRECT STANDING POSTURES  Common incorrect standing postures include a forward head, locked knees and/or an excessive swayback. WALKING Walk with an upright posture. Your ears, shoulders and hips should all line-up. PROLONGED ACTIVITY IN A FLEXED POSITION When completing a task that requires you to bend forward at your waist or lean over a low surface, try to find a way to stabilize 3 out of 4 of your limbs. You can place a hand or elbow on your thigh or rest a knee on the surface you are reaching across. This will provide you more stability so that your muscles do not fatigue as quickly. By keeping your knees relaxed, or slightly bent, you will also reduce stress across your lower back. CORRECT LIFTING TECHNIQUES DO :   Assume a wide stance. This will provide you more stability and the opportunity to get as close as possible to the object which you are lifting.  Tense your abdominals to brace your spine. Bend at the knees and hips. Keeping your back locked in a neutral-spine position, lift using your leg muscles. Lift with your legs, keeping your back straight.  Test the weight of unknown objects before attempting to lift them.  Try to keep your elbows locked down at your sides in order get the best strength from your shoulders when carrying an object.  Always ask for help when lifting heavy or awkward objects. INCORRECT LIFTING TECHNIQUES DO NOT:   Lock your knees when lifting, even if  it is a small object.  Bend and twist. Pivot at your feet or move your feet when needing to change directions.  Assume that you can safely pick up even a paper clip without proper  posture. Document Released: 02/17/2005 Document Revised: 05/12/2011 Document Reviewed: 06/01/2008 [facility] Patient Information 2015 [place], [place]. This information is not intended to replace advice given to you by your health care provider. Make sure you discuss any questions you have with your health care provider.

## 2014-03-31 NOTE — Progress Notes (Signed)
Subjective:    Patient ID: Martin Murphy, male    DOB: 20-Apr-1965, 49 y.o.   MRN: [id number]  PCP: Simona Huh, MD  Chief Complaint  Patient presents with  . Back Pain    Low back pain x2 days, no know injury   . Medication Refill    HCTZ   Patient Active Problem List   Diagnosis Date Noted  . Bunion of great toe 03/31/2014  . Liver dysfunction 12/28/2013  . HTN (hypertension) 10/28/2013  . Malignant neoplasm of prostate 08/02/2013  . MGUS (monoclonal gammopathy of unknown significance) 05/31/2013   Prior to Admission medications   Medication Sig Start Date End Date Taking? Authorizing Provider  Multiple Vitamin (MULTIVITAMIN) tablet Take 2 tablets by mouth daily.    Yes Historical Provider, MD  oxyCODONE-acetaminophen (PERCOCET) 7.5-325 MG per tablet Take 1 tablet by mouth every 4 (four) hours as needed for pain.   Yes Historical Provider, MD  tamsulosin (FLOMAX) 0.4 MG CAPS capsule Take 0.4 mg by mouth.   Yes Historical Provider, MD  cyclobenzaprine (FLEXERIL) 5 MG tablet Take 1 tablet (5 mg total) by mouth 3 (three) times daily as needed for muscle spasms. 03/31/14   Araceli Bouche, PA  gabapentin (NEURONTIN) 300 MG capsule Take 1 capsule (300 mg total) by mouth 3 (three) times daily. Patient not taking: Reported on 03/31/2014 02/01/14   Bronson Ing, DPM  hydrochlorothiazide (HYDRODIURIL) 25 MG tablet Take 1 tablet (25 mg total) by mouth daily. 03/31/14   Araceli Bouche, PA   Medications, allergies, past medical history, surgical history, family history, social history and problem list reviewed and updated.  HPI  49 yom with pmh htn, prostate issues, mgus presents today for low back pain and hctz refill.  Has recently been seeing podiatry a lot for bunions. Has had several procedures and was recently put on gabapentin and percocet for pain. Has not taken gaba at all, taking percocet prn.   Ran out of hctz few months ago. Does not take bp at home. Denies cp, sob, ha,  dizziness, vision changes, le edema.   Low back pain started 2 days ago. He was picking up around the house and developed gradual onset bilateral low back, everntually rated 8/10 after an hour. Was steady through yest. No radiation. 8/10 at rest but worsens with movement and walking. Today he states pain has improved slightly, not having any at rest but get moderate bilateral low back pain with any activity. No radiation.   Denies bowel/bladder dysfx, perianal los, tingling/numbness/weakness down either leg. Denies dysuria, hematuria. Has been constipated past couple days on percocet. Denies fever, chills. Has been applying heat with no relief. No relief when takes percocet.   Sees oncologist for mgus.   Review of Systems See HPI.     Objective:   Physical Exam  Constitutional: He appears well-developed and well-nourished.  Non-toxic appearance. He does not have a sickly appearance. He does not appear ill. No distress.  BP 142/82 mmHg  Pulse 97  Temp(Src) 98.5 F (36.9 C) (Oral)  Resp 18  Ht 5\' 11"  (1.803 m)  Wt 215 lb (97.523 kg)  BMI 30.00 kg/m2  SpO2 97%   Eyes: Conjunctivae and EOM are normal. Pupils are equal, round, and reactive to light.  Cardiovascular: Normal rate, regular rhythm and normal heart sounds.  Exam reveals no gallop.   No murmur heard. Pulmonary/Chest: Effort normal and breath sounds normal. He has no decreased breath sounds. He has no wheezes. He  has no rhonchi. He has no rales.  Musculoskeletal:       Lumbar back: He exhibits decreased range of motion, tenderness and spasm. He exhibits no bony tenderness and no laceration.       Back:       Arms: Mild ttp across low back. No spinal ttp. Spasm noted along right spinal border.   Neurological: He has normal strength. No sensory deficit.  Normal strength, sensation down LE bilaterally.   Skin: No rash noted.  Psychiatric: He has a normal mood and affect. His speech is normal and behavior is normal.        Assessment & Plan:   49 yom with pmh htn, prostate issues, mgus presents today for low back pain and hctz refill.  Essential hypertension - Plan: hydrochlorothiazide (HYDRODIURIL) 25 MG tablet --slightly elevated today --cmp normal 3 months ago --refilled for 6 months, rtc 5-6 months for cpe and bp refill  Bilateral low back pain without sciatica - Plan: cyclobenzaprine (FLEXERIL) 5 MG tablet --pain most likely due to strain/spasm --flexeril prn, do not take with percocet --heat/low back exercises given/start tomorrow --rtc 4-5 days if not improved  MGUS (monoclonal gammopathy of unknown significance) --pending labs to be drawn on pts chart --encouraged to fu with oncology as scheduled  Julieta Gutting, PA-C Physician Assistant-Certified [facility]  03/31/2014 1:03 PM

## 2014-04-05 ENCOUNTER — Ambulatory Visit (INDEPENDENT_AMBULATORY_CARE_PROVIDER_SITE_OTHER): Payer: 59

## 2014-04-05 ENCOUNTER — Encounter: Payer: Self-pay | Admitting: Podiatrist

## 2014-04-05 ENCOUNTER — Other Ambulatory Visit: Payer: Self-pay | Admitting: Podiatrist

## 2014-04-05 ENCOUNTER — Ambulatory Visit (INDEPENDENT_AMBULATORY_CARE_PROVIDER_SITE_OTHER): Payer: 59 | Admitting: Podiatrist

## 2014-04-05 VITALS — BP 142/82 | HR 97 | Resp 16

## 2014-04-05 DIAGNOSIS — Z472 Encounter for removal of internal fixation device: Secondary | ICD-10-CM

## 2014-04-05 DIAGNOSIS — Z9889 Other specified postprocedural states: Secondary | ICD-10-CM

## 2014-04-19 ENCOUNTER — Ambulatory Visit (INDEPENDENT_AMBULATORY_CARE_PROVIDER_SITE_OTHER): Payer: 59

## 2014-04-19 ENCOUNTER — Encounter: Payer: Self-pay | Admitting: Podiatrist

## 2014-04-19 ENCOUNTER — Ambulatory Visit (INDEPENDENT_AMBULATORY_CARE_PROVIDER_SITE_OTHER): Payer: 59 | Admitting: Podiatrist

## 2014-04-19 VITALS — BP 142/89 | HR 85 | Resp 16

## 2014-04-19 DIAGNOSIS — Z9889 Other specified postprocedural states: Secondary | ICD-10-CM

## 2014-04-19 DIAGNOSIS — M2010 Hallux valgus (acquired), unspecified foot: Secondary | ICD-10-CM

## 2014-04-20 NOTE — Progress Notes (Signed)
Subjective: Martin Murphy presents today for 1 week follow-up regarding removal of screw and manipulation of joint left first metatarsophalangeal joint as well as for routine follow-up of bunion correction of the right foot. He relates that the left foot feels so much better even this early after surgery. Continues to relate the right foot is doing okay however it still swollen. Denies any local or systemic signs of infection.   Objective: Neurovascular status is intact to bilateral feet with excellent alignment and position of the first metatarsal phalangeal joint is noted. Motion is increase in the first metatarsal phalangeal joint left. No redness, no swelling, no streaking, no drainage, no sign of infection is present.  X-ray show removal of screw and healing of osteotomy site left.   Assessment: Status post removal of screw and manipulation of joint left first metatarsal phalangeal joint and status post bunion surgery on the right foot dos 02/27/14    Plan: Recommended continued range of motion exercises at first metatarsal phalangeal joint bilateral. He will start to wean out of the postop shoe in 1 week.  I'll see him back in 2 weeks for follow-up and we'll reassess at that time.

## 2014-04-20 NOTE — Progress Notes (Signed)
Subjective: Martin Murphy presents today for 3 week follow-up regarding removal of screw and manipulation of joint left first metatarsophalangeal joint as well as for  follow-up of bunion correction of the right foot. He relates that the left foot feels better however he is concerned of the stiffness and swelling in both feet after surgery.  He relates decreased range of motion and some difficulty with walking without discomfort.  Objective: Neurovascular status is intact to bilateral feet with excellent alignment and position of the first metatarsal phalangeal joint is noted. Motion is increase in the first metatarsal phalangeal joint left although decreased from the last visit.  Right first MPJ continues to be swollen and mildly painful.  X-ray show removal of screw and healing of osteotomy site left with soft tissue inflammation present.  Xray right shows continued healing osteotomy with good post operative progress noted.     Assessment: Status post removal of screw and manipulation of joint left first metatarsal phalangeal joint and status post bunion surgery on the right foot dos 02/27/14    Plan: Recommended physical therapy due to the decrease in range of motion, pain and swelling he is experiencing.  rx for pt through [facility].  Will follow up on progress.

## 2014-05-03 ENCOUNTER — Ambulatory Visit (INDEPENDENT_AMBULATORY_CARE_PROVIDER_SITE_OTHER): Payer: 59 | Admitting: Podiatrist

## 2014-05-03 ENCOUNTER — Encounter: Payer: Self-pay | Admitting: Podiatrist

## 2014-05-03 ENCOUNTER — Ambulatory Visit (INDEPENDENT_AMBULATORY_CARE_PROVIDER_SITE_OTHER): Payer: 59

## 2014-05-03 VITALS — BP 136/82 | HR 89 | Resp 12

## 2014-05-03 DIAGNOSIS — M2012 Hallux valgus (acquired), left foot: Secondary | ICD-10-CM

## 2014-05-03 DIAGNOSIS — Z9889 Other specified postprocedural states: Secondary | ICD-10-CM

## 2014-05-03 DIAGNOSIS — M2011 Hallux valgus (acquired), right foot: Secondary | ICD-10-CM

## 2014-05-03 NOTE — Progress Notes (Signed)
Subjective: Mr. Fitzsimmons presents today for follow-up regarding removal of screw and manipulation of joint left first metatarsophalangeal joint as well as for  follow-up of bunion correction of the right foot. He relates that he still has pain in both feet. He relates decreased range of motion and some difficulty with walking without discomfort. He has gone to 2 sessions of physical therapy and has not noticed much of a difference yet.  Objective: Neurovascular status is intact to bilateral feet with excellent alignment and position of the first metatarsal phalangeal joint is noted. Motion is improved in the first metatarsal phalangeal joint left in comparison with the previous visit.  Right first MPJ continues to be swollen and mildly painful range of motion is also improved in this joint as well..   Assessment: Status post removal of screw and manipulation of joint left first metatarsal phalangeal joint and status post bunion surgery on the right foot dos 02/27/14    Plan: Recommended continued physical therapy due to the decrease in range of motion, pain and swelling he is experiencing. We will augment prescription for physical therapy through [facility] to add iontophoresis to his regimen. I will see him back in 4 weeks and see how he is doing at that time we will reassess getting him back to work at that point in time.

## 2014-05-05 ENCOUNTER — Telehealth: Payer: Self-pay | Admitting: *Deleted

## 2014-05-11 NOTE — Telephone Encounter (Signed)
Dr. Valentina Lucks added an order for Iontophoresis for physical therapy.  I faxed the order.

## 2014-05-31 DIAGNOSIS — R52 Pain, unspecified: Secondary | ICD-10-CM

## 2014-05-31 DIAGNOSIS — M79673 Pain in unspecified foot: Secondary | ICD-10-CM

## 2014-06-07 ENCOUNTER — Ambulatory Visit (INDEPENDENT_AMBULATORY_CARE_PROVIDER_SITE_OTHER): Payer: 59 | Admitting: Podiatrist

## 2014-06-07 ENCOUNTER — Encounter: Payer: Self-pay | Admitting: Podiatrist

## 2014-06-07 VITALS — BP 154/104 | HR 88 | Resp 15

## 2014-06-07 DIAGNOSIS — Z9889 Other specified postprocedural states: Secondary | ICD-10-CM

## 2014-06-07 DIAGNOSIS — M2011 Hallux valgus (acquired), right foot: Secondary | ICD-10-CM

## 2014-06-07 DIAGNOSIS — M2010 Hallux valgus (acquired), unspecified foot: Secondary | ICD-10-CM

## 2014-06-07 NOTE — Progress Notes (Deleted)
   Subjective:    Patient ID: Martin Murphy, male    DOB: 1965/09/06, 49 y.o.   MRN: [id number]  HPI  Pt presents with ongoing bilateral foot pain and swelling, has continued with PT  Review of Systems     Objective:   Physical Exam        Assessment & Plan:

## 2014-06-08 ENCOUNTER — Telehealth: Payer: Self-pay | Admitting: *Deleted

## 2014-06-08 NOTE — Telephone Encounter (Addendum)
Patient brought a letter over for physical therapy.  We need a prescription for Iontophoresis.  Okay, I'll fax it over.  Order was faxed to [facility].

## 2014-06-14 NOTE — Progress Notes (Signed)
Subjective: Martin Murphy presents today for follow-up regarding bunionectomy and subsequent removal of screw and manipulation of joint left first metatarsophalangeal joint as well as for  follow-up of bunion correction of the right foot. He relates that he still has pain in both feet. He has been doing physical therapy twice weekly and relates some improvement however he is unable to return to his work duties. He continues to relate decreased range of motion and some difficulty with walking without discomfort.   Objective: Neurovascular status is intact to bilateral feet with excellent alignment and position of the first metatarsal phalangeal joint is noted. Motion is improved in the first metatarsal phalangeal joint left in comparison with the previous visit.  Right first MPJ continues to be swollen and mildly painful range of motion is also improved in this joint as well. xrays show well healing osteotomies from surgery bilateral first- no abnormal findings on xray are seen.     Assessment: Status post bunion repair left with subsequent removal of screw and manipulation of joint left first metatarsal phalangeal joint-- and status post bunion surgery on the right foot dos 02/27/14    Plan: Recommended continued physical therapy due to the decrease in range of motion, pain and swelling he is experiencing. A letter was drafted for him to take to his physical therapist to start using iontophoresis as this has not been used yet.  He is encouraged to start walking more to increase his stamina such that he can be ready to return to work.  If there is no improvement will get a second opinion for evaluation of continued foot pain that has remained status post surgery.

## 2014-06-21 ENCOUNTER — Other Ambulatory Visit (HOSPITAL_BASED_OUTPATIENT_CLINIC_OR_DEPARTMENT_OTHER): Payer: 59

## 2014-06-21 DIAGNOSIS — D472 Monoclonal gammopathy: Secondary | ICD-10-CM

## 2014-06-21 DIAGNOSIS — K7689 Other specified diseases of liver: Secondary | ICD-10-CM

## 2014-06-21 LAB — LACTATE DEHYDROGENASE (CC13): LDH: 141 U/L (ref 125–245)

## 2014-06-21 LAB — COMPREHENSIVE METABOLIC PANEL (CC13)
ALT: 48 U/L (ref 0–55)
AST: 38 U/L — AB (ref 5–34)
Albumin: 3.7 g/dL (ref 3.5–5.0)
Alkaline Phosphatase: 190 U/L — ABNORMAL HIGH (ref 40–150)
Anion Gap: 14 mEq/L — ABNORMAL HIGH (ref 3–11)
BUN: 13.8 mg/dL (ref 7.0–26.0)
CO2: 22 mEq/L (ref 22–29)
CREATININE: 1 mg/dL (ref 0.7–1.3)
Calcium: 9 mg/dL (ref 8.4–10.4)
Chloride: 104 mEq/L (ref 98–109)
EGFR: >90 mL/min/{1.73_m2} (ref >90)
Glucose: 93 mg/dl (ref 70–140)
Potassium: 3.8 mEq/L (ref 3.5–5.1)
Sodium: 140 mEq/L (ref 136–145)
Total Bilirubin: 0.47 mg/dL (ref 0.20–1.20)
Total Protein: 7.9 g/dL (ref 6.4–8.3)

## 2014-06-21 LAB — CBC WITH DIFFERENTIAL/PLATELET
BASO%: 0.3 % (ref 0.0–2.0)
Basophils Absolute: 0 10*3/uL (ref 0.0–0.1)
EOS%: 3.4 % (ref 0.0–7.0)
Eosinophils Absolute: 0.2 10*3/uL (ref 0.0–0.5)
HCT: 42.4 % (ref 38.4–49.9)
HEMOGLOBIN: 13.8 g/dL (ref 13.0–17.1)
LYMPH#: 1.1 10*3/uL (ref 0.9–3.3)
LYMPH%: 23.3 % (ref 14.0–49.0)
MCH: 27.1 pg — AB (ref 27.2–33.4)
MCHC: 32.5 g/dL (ref 32.0–36.0)
MCV: 83.4 fL (ref 79.3–98.0)
MONO#: 0.5 10*3/uL (ref 0.1–0.9)
MONO%: 10.1 % (ref 0.0–14.0)
NEUT%: 62.9 % (ref 39.0–75.0)
NEUTROS ABS: 3 10*3/uL (ref 1.5–6.5)
Platelets: 218 10*3/uL (ref 140–400)
RBC: 5.09 10*6/uL (ref 4.20–5.82)
RDW: 14 % (ref 11.0–14.6)
WBC: 4.8 10*3/uL (ref 4.0–10.3)

## 2014-06-23 LAB — KAPPA/LAMBDA LIGHT CHAINS
Kappa free light chain: 2.03 mg/dL — ABNORMAL HIGH (ref 0.33–1.94)
Kappa:Lambda Ratio: 1.14 (ref 0.26–1.65)
Lambda Free Lght Chn: 1.78 mg/dL (ref 0.57–2.63)

## 2014-06-23 LAB — IGG, IGA, IGM
IGM, SERUM: 36 mg/dL — AB (ref 41–251)
IgA: 684 mg/dL — ABNORMAL HIGH (ref 68–379)
IgG (Immunoglobin G), Serum: 1400 mg/dL (ref 650–1600)

## 2014-06-23 LAB — BETA 2 MICROGLOBULIN, SERUM: Beta-2 Microglobulin: 3.14 mg/L — ABNORMAL HIGH (ref <=2.51)

## 2014-06-28 ENCOUNTER — Telehealth: Payer: Self-pay | Admitting: Internal Medicine

## 2014-06-28 ENCOUNTER — Ambulatory Visit (HOSPITAL_BASED_OUTPATIENT_CLINIC_OR_DEPARTMENT_OTHER): Payer: 59 | Admitting: Internal Medicine

## 2014-06-28 ENCOUNTER — Encounter: Payer: Self-pay | Admitting: Internal Medicine

## 2014-06-28 VITALS — BP 131/84 | HR 83 | Temp 98.2°F | Resp 18 | Ht 71.0 in | Wt 217.6 lb

## 2014-06-28 DIAGNOSIS — R768 Other specified abnormal immunological findings in serum: Secondary | ICD-10-CM | POA: Diagnosis not present

## 2014-06-28 DIAGNOSIS — D472 Monoclonal gammopathy: Secondary | ICD-10-CM

## 2014-06-28 NOTE — Telephone Encounter (Signed)
lvm for pt regarding to [facility] l2017 appt....mailed pt appt sched/avs and letter

## 2014-06-28 NOTE — Progress Notes (Signed)
[facility] Telephone:([phone]   Fax:([phone]) [phone]  OFFICE PROGRESS NOTE  Simona Huh, MD [address] [name] [address]  DIAGNOSIS: Monoclonal gammopathy of undetermined significance  PRIOR THERAPY: None  CURRENT THERAPY: Observation.   INTERVAL HISTORY: Martin Murphy 49 y.o. male returns to the clinic today for annual followup visit. The patient is feeling fine today with no specific complaints except for pain in his feet. He is status post [place] bunion correction left foot. He was given prescription for Neurontin by his orthopedic surgeon but the patient did not use it. He denied having any significant chest pain, shortness of breath, cough or hemoptysis. The patient denied having any nausea or vomiting. He has no significant weight loss or night sweats. He has several studies performed recently including myeloma panel and he is here for evaluation and discussion of his lab results. The previous ultrasound of the abdomen showed no significant abnormalities.  MEDICAL HISTORY: Past Medical History  Diagnosis Date  . Hypertension   . 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 Prostate cancer DX  12/20/2012    biopsies x 2, Gleason 3+3=6  vol 36  . Bunion     bilateral great toe  . Nocturia   . At risk for sleep apnea     STOP-BANG=5    SENT TO PCP 10-13-2013    ALLERGIES:  has No Known Allergies.  MEDICATIONS:  Current Outpatient Prescriptions  Medication Sig Dispense Refill  . cyclobenzaprine (FLEXERIL) 5 MG tablet Take 1 tablet (5 mg total) by mouth 3 (three) times daily as needed for muscle spasms. 30 tablet 0  . gabapentin (NEURONTIN) 300 MG capsule Take 1 capsule (300 mg total) by mouth 3 (three) times daily. 90 capsule 0  . hydrochlorothiazide (HYDRODIURIL) 25 MG tablet Take 1 tablet (25 mg total) by mouth daily. 30 tablet 5  . Multiple Vitamin (MULTIVITAMIN) tablet Take 2 tablets by mouth daily.     Marland Kitchen  oxyCODONE-acetaminophen (PERCOCET) 7.5-325 MG per tablet Take 1 tablet by mouth every 4 (four) hours as needed for pain.    . tamsulosin (FLOMAX) 0.4 MG CAPS capsule Take 0.4 mg by mouth.     No current facility-administered medications for this visit.    SURGICAL HISTORY:  Past Surgical History  Procedure Laterality Date  . Prostate biopsy  12/20/12    gleason 6  . Prostate biopsy  06/07/13    gleason 6, vol 36 gms  . Inguinal hernia repair Bilateral right  02-09-2012/   left  1990's  . Bronchoscopy  1990's    w/  biopsy's  . Radioactive seed implant N/A 10/20/2013    Procedure: RADIOACTIVE SEED IMPLANT;  Surgeon: Bernestine Amass, MD;  Location: [facility];  Service: Urology;  Laterality: N/A;  . Bunionectomy    . Foot surgery      REVIEW OF SYSTEMS:  A comprehensive review of systems was negative except for: Pain in his feet   PHYSICAL EXAMINATION: General appearance: alert, cooperative and no distress Head: Normocephalic, without obvious abnormality, atraumatic Neck: no adenopathy, no JVD, supple, symmetrical, trachea midline and thyroid not enlarged, symmetric, no tenderness/mass/nodules Lymph nodes: Cervical, supraclavicular, and axillary nodes normal. Resp: clear to auscultation bilaterally Back: symmetric, no curvature. ROM normal. No CVA tenderness. Cardio: regular rate and rhythm, S1, S2 normal, no murmur, click, rub or gallop GI: soft, non-tender; bowel sounds normal; no masses,  no organomegaly Extremities: extremities normal, atraumatic, no cyanosis or edema  ECOG PERFORMANCE STATUS: 0 - Asymptomatic  Blood pressure 131/84, pulse 83, temperature 98.2 F (36.8 C), temperature source Oral, resp. rate 18, height 5\' 11"  (1.803 m), weight 217 lb 9.6 oz (98.703 kg), SpO2 97 %.  LABORATORY DATA: Lab Results  Component Value Date   WBC 4.8 06/21/2014   HGB 13.8 06/21/2014   HCT 42.4 06/21/2014   MCV 83.4 06/21/2014   PLT 218 06/21/2014      Chemistry        Component Value Date/Time   NA 140 06/21/2014 1357   NA 141 10/13/2013 1309   K 3.8 06/21/2014 1357   K 4.4 10/13/2013 1309   CL 102 10/13/2013 1309   CO2 22 06/21/2014 1357   CO2 26 10/13/2013 1309   BUN 13.8 06/21/2014 1357   BUN 16 10/13/2013 1309   CREATININE 1.0 06/21/2014 1357   CREATININE 1.13 10/13/2013 1309      Component Value Date/Time   CALCIUM 9.0 06/21/2014 1357   CALCIUM 10.0 10/13/2013 1309   ALKPHOS 190* 06/21/2014 1357   ALKPHOS 213* 10/13/2013 1309   AST 38* 06/21/2014 1357   AST 46* 10/13/2013 1309   ALT 48 06/21/2014 1357   ALT 56* 10/13/2013 1309   BILITOT 0.47 06/21/2014 1357   BILITOT 0.3 10/13/2013 1309     Other lab results: Beta-2 microglobulin 3.14, IgG 1400, IgA 684 and IgM 36. Kappa light chain 2.03, free lambda light chain  1.78 with a kappa/lambda ratio of 1.14  RADIOGRAPHIC STUDIES: No results found.  ASSESSMENT AND PLAN: This is a very pleasant 49 years old [place] American male with minor elevation of IgA most likely inflammatory in origin but I cannot rule out monoclonal gammopathy of undetermined significance. His myeloma panel showed no evidence for disease progression. I discussed the lab result with the patient today. I recommended for him to continue on observation with repeat myeloma panel in one year. I will call him with the result and recommendation if there is any significant issues. He was advised to call immediately if he has any concerning symptoms in the interval. The patient voices understanding of current disease status and treatment options and is in agreement with the current care plan.  All questions were answered. The patient knows to call the clinic with any problems, questions or concerns. We can certainly see the patient much sooner if necessary.  Disclaimer: This note was dictated with voice recognition software. Similar sounding words can inadvertently be transcribed and may not be corrected upon review.

## 2014-07-05 ENCOUNTER — Ambulatory Visit: Payer: 59

## 2014-07-05 ENCOUNTER — Ambulatory Visit (INDEPENDENT_AMBULATORY_CARE_PROVIDER_SITE_OTHER): Payer: 59

## 2014-07-05 ENCOUNTER — Encounter: Payer: Self-pay | Admitting: Podiatrist

## 2014-07-05 ENCOUNTER — Ambulatory Visit (INDEPENDENT_AMBULATORY_CARE_PROVIDER_SITE_OTHER): Payer: 59 | Admitting: Podiatrist

## 2014-07-05 VITALS — BP 129/86 | HR 82 | Resp 14

## 2014-07-05 DIAGNOSIS — Z9889 Other specified postprocedural states: Secondary | ICD-10-CM

## 2014-07-05 DIAGNOSIS — M2010 Hallux valgus (acquired), unspecified foot: Secondary | ICD-10-CM | POA: Diagnosis not present

## 2014-07-05 MED ORDER — PREGABALIN 150 MG PO CAPS: 150.0000 mg | ORAL_CAPSULE | Freq: Two times a day (BID) | ORAL | Status: DC

## 2014-07-05 NOTE — Patient Instructions (Signed)
Pregabalin capsules What is this medicine? PREGABALIN (pre GAB a lin) is used to treat nerve pain from diabetes, shingles, spinal cord injury, and fibromyalgia. It is also used to control seizures in epilepsy. This medicine may be used for other purposes; ask your health care provider or pharmacist if you have questions. COMMON BRAND NAME(S): Lyrica What should I tell my health care provider before I take this medicine? They need to know if you have any of these conditions: -bleeding problems -heart disease, including heart failure -history of alcohol or drug abuse -kidney disease -suicidal thoughts, plans, or attempt; a previous suicide attempt by you or a family member -an unusual or allergic reaction to pregabalin, gabapentin, other medicines, foods, dyes, or preservatives -pregnant or trying to get pregnant or trying to conceive with your partner -breast-feeding How should I use this medicine? Take this medicine by mouth with a glass of water. Follow the directions on the prescription label. You can take this medicine with or without food. Take your doses at regular intervals. Do not take your medicine more often than directed. Do not stop taking except on your doctor's advice. A special MedGuide will be given to you by the pharmacist with each prescription and refill. Be sure to read this information carefully each time. Talk to your pediatrician regarding the use of this medicine in children. Special care may be needed. Overdosage: If you think you have taken too much of this medicine contact a poison control center or emergency room at once. NOTE: This medicine is only for you. Do not share this medicine with others. What if I miss a dose? If you miss a dose, take it as soon as you can. If it is almost time for your next dose, take only that dose. Do not take double or extra doses. What may interact with this medicine? -alcohol -certain medicines for blood pressure like captopril,  enalapril, or lisinopril -certain medicines for diabetes, like pioglitazone or rosiglitazone -certain medicines for anxiety or sleep -narcotic medicines for pain This list may not describe all possible interactions. Give your health care provider a list of all the medicines, herbs, non-prescription drugs, or dietary supplements you use. Also tell them if you smoke, drink alcohol, or use illegal drugs. Some items may interact with your medicine. What should I watch for while using this medicine? Tell your doctor or healthcare professional if your symptoms do not start to get better or if they get worse. Visit your doctor or health care professional for regular checks on your progress. Do not stop taking except on your doctor's advice. You may develop a severe reaction. Your doctor will tell you how much medicine to take. Wear a medical identification bracelet or chain if you are taking this medicine for seizures, and carry a card that describes your disease and details of your medicine and dosage times. You may get drowsy or dizzy. Do not drive, use machinery, or do anything that needs mental alertness until you know how this medicine affects you. Do not stand or sit up quickly, especially if you are an older patient. This reduces the risk of dizzy or fainting spells. Alcohol may interfere with the effect of this medicine. Avoid alcoholic drinks. If you have a heart condition, like congestive heart failure, and notice that you are retaining water and have swelling in your hands or feet, contact your health care provider immediately. The use of this medicine may increase the chance of suicidal thoughts or actions. Pay special attention   to how you are responding while on this medicine. Any worsening of mood, or thoughts of suicide or dying should be reported to your health care professional right away. This medicine has caused reduced sperm counts in some men. This may interfere with the ability to father a  child. You should talk to your doctor or health care professional if you are concerned about your fertility. Women who become pregnant while using this medicine for seizures may enroll in the [place] Antiepileptic Drug Pregnancy Registry by calling 1-888-233-2334. This registry collects information about the safety of antiepileptic drug use during pregnancy. What side effects may I notice from receiving this medicine? Side effects that you should report to your doctor or health care professional as soon as possible: -allergic reactions like skin rash, itching or hives, swelling of the face, lips, or tongue -breathing problems -changes in vision -chest pain -confusion -jerking or unusual movements of any part of your body -loss of memory -muscle pain, tenderness, or weakness -suicidal thoughts or other mood changes -swelling of the ankles, feet, hands -unusual bruising or bleeding Side effects that usually do not require medical attention (Report these to your doctor or health care professional if they continue or are bothersome.): -dizziness -drowsiness -dry mouth -headache -nausea -tremors -trouble sleeping -weight gain This list may not describe all possible side effects. Call your doctor for medical advice about side effects. You may report side effects to FDA at 1-800-FDA-1088. Where should I keep my medicine? Keep out of the reach of children. This medicine can be abused. Keep your medicine in a safe place to protect it from theft. Do not share this medicine with anyone. Selling or giving away this medicine is dangerous and against the law. Store at room temperature between 15 and 30 degrees C (59 and 86 degrees F). Throw away any unused medicine after the expiration date. NOTE: This sheet is a summary. It may not cover all possible information. If you have questions about this medicine, talk to your doctor, pharmacist, or health care provider.  2015, Elsevier/Gold Standard.  (2010-08-22 20:00:36)  

## 2014-07-05 NOTE — Progress Notes (Signed)
Chief Complaint  Patient presents with  . Bunions    4 month f/u B/L great toe bunionectomy,"is still in a lot of pain"   Left foot bunion date of original surgery 11/09/13 with removal of screw and joint manipulation on 03/27/2014  Right foot bunion correction date of surgery 02/27/14   Subjective: Martin Murphy presents today for follow-up of bilateral foot bunion surgery. He states he still in a lot of pain and he's participated in physical therapy which has failed to completely alleviate the pain that he states is helping with the discomfort. He relates that his physical therapist suggested four more sessions. In general he states he is unable to stand for long periods of time or walk comfortably without pain.  Objective: Neurovascular status is intact and unchanged. Range of motion is improving at the first metatarsophalangeal joint bilateral. No pain in range of motion with clinical exam is noted. Dorsal range of motion is 50 plantar range of motion is 20 bilateral. Bilateral incision site is completely healed. Swelling has decreased significantly bilateral. Overall progress of bilateral feet is excellent status post surgery on a clinical appearance.   X-ray shows healed osteotomy sites and screw fixation in place on the right foot and it has been removed on the left foot. No sign of fracture or dislocation is seen, no sign of swelling as seen on x-ray. Healed osteotomy status post bunion correction noted bilateral   Assessment:  Status post bunion correction bilateral feet: Right foot is 4-1/2 months postop and left foot is 8 months postop  Plan: Agreed that he should continue physical therapy as instructed by his therapist. It is a bit abnormal for him to be in pain this far out post surgery with no clinical indication or x-ray indication as to why his pain still exists. Recommended starting to walk more as he is able and ordered increases stamina to plan to go back to work. I will agree to  keep him out of little longer in order for him to continue physical therapy and reaches full rehabilitation potential. I will also speak with his physical therapist to determine if 4 weeks should get him where he needs to be to be able to walk comfortably and return to work. I'll follow-up with him in 3 weeks and we will determine the status of being able to send him back to work.

## 2014-07-26 ENCOUNTER — Encounter: Payer: Self-pay | Admitting: Podiatrist

## 2014-07-26 ENCOUNTER — Ambulatory Visit (INDEPENDENT_AMBULATORY_CARE_PROVIDER_SITE_OTHER): Payer: 59 | Admitting: Podiatrist

## 2014-07-26 VITALS — BP 119/79 | HR 90 | Resp 12

## 2014-07-26 DIAGNOSIS — M2012 Hallux valgus (acquired), left foot: Secondary | ICD-10-CM

## 2014-07-26 DIAGNOSIS — M2011 Hallux valgus (acquired), right foot: Secondary | ICD-10-CM

## 2014-07-26 DIAGNOSIS — Z9889 Other specified postprocedural states: Secondary | ICD-10-CM

## 2014-07-26 NOTE — Patient Instructions (Signed)
I'm going to send you to someone who can sort out if Neuropathy may be a contributing part of your pain symptoms. She is great-- Dr. Kirk Ruths- [facility].  [facility] [place] [address] [facility]  We will set up the referral and her office will call you with appointment information.

## 2014-07-31 NOTE — Progress Notes (Signed)
Chief Complaint  Patient presents with  . Routine Post Op    ''B/L BOTTOM OF THE FEET STILL PAINFUL.''   Left foot bunion date of original surgery 11/09/13 with removal of screw and joint manipulation on 03/27/2014  Right foot bunion correction date of surgery 02/27/14   Subjective: Martin Murphy presents today for follow-up of bilateral foot bunion surgery. He relates his feet are still hurting and he is wondering if his bunions were not the cause of pain.  He states the bunion areas are unchanged and he still has what he describes as neuropathy type symptoms on both feet.  He is not diabetic but he has had chemotherapy for prostate cancer in the past.  He relates burning and pain in both feet especially at night.  He says that PT helps when he was there however his feet go back to being painful soon after therapy.    Objective: Neurovascular status is intact and unchanged. Range of motion is improved at the first metatarsophalangeal joint bilateral. No pain in range of motion with clinical exam is noted. Dorsal range of motion is 50 plantar range of motion is 20 bilateral. Bilateral incision sites  completely healed. Swelling has decreased significantly bilateral. Clinical appearance from the bunion correction is excellent.  Sensation intact via Simms weinstein monofilament at 5/5 sites bilateral. Vibratory sensation intact.  Achilles tendon reflex intact.    Assessment:  Status post bunion correction bilateral feet: Right foot is 4-1/2 months postop and left foot is 8 months postop- possible neuropathy  Plan: Agreed that he should continue physical therapy as instructed by his therapist. I recommended he see Martin Murphy to determine if he does have neuropathy versus a radiculopathy or some other etiology for his foot pain.  Clinically, he is healed from a surgical standpoint however he still subjectively complains of pain. He relates the pain is no better or worse than before surgery, but the  surgery did not relieve the pain he was experiencing.  Again, he will stay in PT.  I decreased his lyrica to 75 at night so he can tolerate the medication as he states he was unable to tolerate 150 qhs.  He is still out of work and states he cannot go back not being able to stand for long periods.  He will follow up with Martin Murphy in 4 weeks for follow up and by that time we will know if there is another issue occurring to cause his foot neuropathic pain.

## 2014-08-02 ENCOUNTER — Telehealth: Payer: Self-pay | Admitting: *Deleted

## 2014-08-02 DIAGNOSIS — G629 Polyneuropathy, unspecified: Secondary | ICD-10-CM

## 2014-08-03 NOTE — Telephone Encounter (Signed)
Pt's request to be referred to Dr. Neomia Dear for pain management as recommended by Dr. Valentina Lucks.  I faxed Dr. Anselmo Rod referral sheet, chart notes 10/14/2013 - 07/26/2014, pt data sheet, and copy of insurance cards front and back to [phone].

## 2014-08-18 ENCOUNTER — Ambulatory Visit (INDEPENDENT_AMBULATORY_CARE_PROVIDER_SITE_OTHER): Payer: 59 | Admitting: Podiatry

## 2014-08-18 ENCOUNTER — Encounter: Payer: Self-pay | Admitting: Podiatry

## 2014-08-18 VITALS — BP 112/58 | HR 88 | Resp 18

## 2014-08-18 DIAGNOSIS — G629 Polyneuropathy, unspecified: Secondary | ICD-10-CM | POA: Diagnosis not present

## 2014-08-18 DIAGNOSIS — G8918 Other acute postprocedural pain: Secondary | ICD-10-CM | POA: Diagnosis not present

## 2014-08-26 ENCOUNTER — Encounter: Payer: Self-pay | Admitting: Podiatry

## 2014-08-26 NOTE — Progress Notes (Signed)
Patient ID: Martin Murphy, male   DOB: 08-Dec-1965, 49 y.o.   MRN: [id number]  03/27/14 Left foot bunion ROH, joint manipulation (original bunionectomy 11/09/13) 02/28/15 Right foot bunionectomy  Subjective: 49 year old male presents the office today for follow-up evaluation status post bilateral bunionectomy with continued discomfort overlying the area as well as tingling and numbness to his feet. He does that since last appointment he has followed up with Dr. Ace Gins.  He states that on Monday he is undergoing L5 injections to see if this helps the symptoms to his lower extremity. He has a previously was gone to physical therapy however he states he stopped his that there is no improvements after 14 weeks. He states that he was on all the symptoms prior to the bunionectomy however he thought the bunion is causing his pain although the pain has continued despite the bunionectomy. He denies any recent injury or trauma to the area and denies any swelling or redness. No other complaints at this time.  While seeing Dr. Ace Gins he was palced on Clonidine patch and given samples of Zorvolex.   Objective: AAO x3, NAD DP/PT pulses palpable bilaterally, CRT less than 3 seconds Protective sensation decreased with Simms Weinstein monofilament Incisions on the dorsal medial aspect of bilateral feet from prior bunionectomy well-healed scar overlying the area. There is mild discomfort upon outpatient along the surgical site at there is no pain with range of motion of first MTPJ. Mild bilateral lower extremity edema. There is no overlying erythema, increase in warmth. No areas of tenderness to bilateral lower extremities. MMT 5/5, ROM WNL.  No open lesions or pre-ulcerative lesions.  No overlying edema, erythema, increase in warmth to bilateral lower extremities.  No pain with calf compression, swelling, warmth, erythema bilaterally.   Assessment: 49 year old male with painful bunion sites and neuralgia bilateral  lower extremities.   Plan: -Previous x-rays discussed and reviewed with the patient.  -Treatment options discussed including all alternatives, risks, and complications -Recommended to continue follow-up with Dr. Ace Gins. They discussed various treatment options with him and there is a plan in place to help.  -Follow-up after the injections this back and after further treatment with Dr. Ace Gins. In the meantime I encouraged to call the office any questions, concerns, changes symptoms.  Celesta Gentile, DPM

## 2014-09-12 ENCOUNTER — Telehealth: Payer: Self-pay | Admitting: *Deleted

## 2014-09-12 NOTE — Telephone Encounter (Signed)
Entered in error

## 2014-09-15 ENCOUNTER — Encounter: Payer: Self-pay | Admitting: Podiatry

## 2014-09-15 ENCOUNTER — Ambulatory Visit (INDEPENDENT_AMBULATORY_CARE_PROVIDER_SITE_OTHER): Payer: 59 | Admitting: Podiatry

## 2014-09-15 VITALS — BP 126/86 | HR 81 | Resp 14

## 2014-09-15 DIAGNOSIS — M2040 Other hammer toe(s) (acquired), unspecified foot: Secondary | ICD-10-CM

## 2014-09-15 DIAGNOSIS — G629 Polyneuropathy, unspecified: Secondary | ICD-10-CM | POA: Diagnosis not present

## 2014-09-18 ENCOUNTER — Encounter: Payer: Self-pay | Admitting: Podiatry

## 2014-09-18 NOTE — Progress Notes (Signed)
Patient ID: Martin Murphy, male   DOB: [birth year], 49 y.o.   MRN: [id number]  03/27/14 Left foot bunion ROH, joint manipulation (original bunionectomy 11/09/13) 02/28/15 Right foot bunionectomy  Subjective: 49 year old male presents the office today for follow-up evaluation status post bilateral bunionectomy with continued discomfort overlying the area as well as tingling and numbness to his feet. He has had steroid injections to his back which did not provide any relief. He is currently scheduled have a nerve conduction test at [facility] neurolog[facility]. No other complaints this time.  Objective: AAO x3, NAD DP/PT pulses palpable bilaterally, CRT less than 3 seconds Protective sensation decreased with Simms Weinstein monofilament Incisions on the dorsal medial aspect of bilateral feet from prior bunionectomy well-healed scar overlying the area. There is mild discomfort upon outpatient along the surgical site at there is no pain with range of motion of first MTPJ. Mild bilateral lower extremity edema. There is no overlying erythema, increase in warmth. Hammertoe contractures are present. There does appear to be some tenderness on the PIPJ's of the lesser digits. There is no one erythema or increase in warmth. No open lesion. No areas of tenderness to bilateral lower extremities. MMT 5/5, ROM WNL.  No open lesions or pre-ulcerative lesions.  No overlying edema, erythema, increase in warmth to bilateral lower extremities.  No pain with calf compression, swelling, warmth, erythema bilaterally.   Assessment: 49 year old male with painful bunion sites and neuralgia bilateral lower extremities.; Hammertoes  Plan: -Treatment options discussed including all alternatives, risks, and complications -Recommended to continue follow-up with Dr. Ace Gins. They discussed various treatment options with him and there is a plan in place to help.  -Scheduled for nerve conduction test. -Dispensed toe crest -Follow-up 4-6  weeks after NCV. In the meantime I encouraged to call the office any questions, concerns, changes symptoms.  Celesta Gentile, DPM

## 2014-09-20 ENCOUNTER — Encounter: Payer: Self-pay | Admitting: Neurology

## 2014-09-20 ENCOUNTER — Ambulatory Visit (INDEPENDENT_AMBULATORY_CARE_PROVIDER_SITE_OTHER): Payer: 59 | Admitting: Neurology

## 2014-09-20 ENCOUNTER — Ambulatory Visit (INDEPENDENT_AMBULATORY_CARE_PROVIDER_SITE_OTHER): Payer: Self-pay | Admitting: Neurology

## 2014-09-20 DIAGNOSIS — G609 Hereditary and idiopathic neuropathy, unspecified: Secondary | ICD-10-CM

## 2014-09-20 DIAGNOSIS — D472 Monoclonal gammopathy: Secondary | ICD-10-CM

## 2014-09-20 HISTORY — DX: Hereditary and idiopathic neuropathy, unspecified: G60.9

## 2014-09-20 NOTE — Progress Notes (Signed)
Please refer to EMG and nerve conduction study procedure note. 

## 2014-09-20 NOTE — Procedures (Signed)
     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 The patient has had increasing issues with discomfort, he denies any low back pain or pain radiating down the legs. He denies any weakness of the extremities. He is being evaluated for a possible neuropathy or a lumbosacral radiculopathy.  NERVE CONDUCTION STUDIES:  Nerve conduction studies were performed on both lower extremities. The distal motor latencies and motor amplitudes for the peroneal and posterior tibial nerves were within normal limits. The nerve conduction velocities for these nerves were also normal. The H reflex latencies were normal. The sensory latencies for the peroneal nerves were within normal limits. The medial and lateral plantar sensory latencies were unobtainable bilaterally.   EMG STUDIES:  EMG study was performed on the right lower extremity:  The tibialis anterior muscle reveals 2 to 4K motor units with full recruitment. No fibrillations or positive waves were seen. The peroneus tertius muscle reveals 2 to 5K motor units with decreased recruitment. No fibrillations or positive waves were seen. The medial gastrocnemius muscle reveals 1 to 4K motor units with slightly decreased recruitment. No fibrillations or positive waves were seen. The vastus lateralis muscle reveals 2 to 4K motor units with full recruitment. No fibrillations or positive waves were seen. The iliopsoas muscle reveals 2 to 4K motor units with full recruitment. No fibrillations or positive waves were seen. The biceps femoris muscle (long head) reveals 2 to 4K motor units with full recruitment. No fibrillations or positive waves were seen. The lumbosacral paraspinal muscles were tested at 3 levels, and revealed no abnormalities of insertional activity at all 3 levels tested. There was good relaxation.  EMG study was performed on the  left lower extremity:  The tibialis anterior muscle reveals 2 to 4K motor units with full recruitment. No fibrillations or positive waves were seen. The peroneus tertius muscle reveals 2 to 5K motor units with decreased recruitment. No fibrillations or positive waves were seen. The medial gastrocnemius muscle reveals 1 to 3K motor units with full recruitment. No fibrillations or positive waves were seen. The vastus lateralis muscle reveals 2 to 4K motor units with full recruitment. No fibrillations or positive waves were seen. The iliopsoas muscle reveals 2 to 4K motor units with full recruitment. No fibrillations or positive waves were seen. The biceps femoris muscle (long head) reveals 2 to 4K motor units with full recruitment. No fibrillations or positive waves were seen. The lumbosacral paraspinal muscles were tested at 3 levels, and revealed no abnormalities of insertional activity at all 3 levels tested. There was good relaxation.   IMPRESSION:  Nerve conduction studies done on both lower extremities were relatively unremarkable with exception of absence of the medial and lateral plantar sensory latencies. The motor amplitudes for the right peroneal and posterior tibial nerves were borderline normal. EMG evaluation of both lower extremities shows mild distal chronic signs of denervation without evidence of an overlying lumbosacral radiculopathy. 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 MGUS may be associated with neuropathy syndromes. Clinical correlation is required.  Jill Alexanders MD 09/20/2014 4:36 PM  [facility] [address] [place], [place] [address]  Phone [phone] Fax [phone]

## 2014-09-25 ENCOUNTER — Telehealth: Payer: Self-pay | Admitting: *Deleted

## 2014-09-25 NOTE — Telephone Encounter (Addendum)
Martin Murphy is requesting Work Status form for office visit 09/15/2014.  Done.

## 2014-10-09 DIAGNOSIS — M2040 Other hammer toe(s) (acquired), unspecified foot: Secondary | ICD-10-CM

## 2014-10-20 ENCOUNTER — Encounter: Payer: Self-pay | Admitting: Podiatry

## 2014-10-20 ENCOUNTER — Ambulatory Visit (INDEPENDENT_AMBULATORY_CARE_PROVIDER_SITE_OTHER): Payer: 59 | Admitting: Podiatry

## 2014-10-20 VITALS — BP 139/86 | HR 72 | Resp 12

## 2014-10-20 DIAGNOSIS — G629 Polyneuropathy, unspecified: Secondary | ICD-10-CM | POA: Diagnosis not present

## 2014-10-20 DIAGNOSIS — M2040 Other hammer toe(s) (acquired), unspecified foot: Secondary | ICD-10-CM | POA: Diagnosis not present

## 2014-10-23 ENCOUNTER — Encounter: Payer: Self-pay | Admitting: Podiatry

## 2014-10-23 NOTE — Progress Notes (Signed)
Patient ID: Martin Murphy, male   DOB: 05/31/1965, 49 y.o.   MRN: [id number]  03/27/14 Left foot bunion ROH, joint manipulation (original bunionectomy 11/09/13) 02/28/15 Right foot bunionectomy  Subjective: 49 year old male presents the office today for follow-up evaluation of bilateral foot pain and numbness to his feet. He states the pain service seems what it was previously. He continues to undergo treatment with Dr. Ace Gins. He recently is undergone psychological testing for possible neuro stimulation. He did have a EMG performs his last appointment. Otherwise there has been no acute changes since last appointment. Denies any systemic complaints as fevers, chills, nausea, vomiting.  Objective: AAO x3, NAD DP/PT pulses palpable bilaterally, CRT less than 3 seconds Protective sensation decreased with Simms Weinstein monofilament Incisions on the dorsal medial aspect of bilateral feet from prior bunionectomy well-healed scar overlying the area. At this time there is no pain on the surgical site there's no pain with first MTPJ range of motion. Trace bilateral lower extremity edema. There is no overlying erythema, increase in warmth. Hammertoe contractures are present. No other areas of tenderness to bilateral lower extremities. MMT 5/5, ROM WNL.  No open lesions or pre-ulcerative lesions.  No overlying edema, erythema, increase in warmth to bilateral lower extremities.  No pain with calf compression, swelling, warmth, erythema bilaterally.   Assessment: 49 year old male with continued bilateral lower extremity pain, neuropathy.  Plan: -Treatment options discussed including all alternatives, risks, and complications -Recommended to continue follow-up with Dr. Ace Gins. He is actively undergoing treatment with her. I will defer treatment to her at this time. -Follow-up after further treatment with Dr. Ace Gins. In the meantime I encouraged to call the office any questions, concerns, changes  symptoms.  Celesta Gentile, DPM

## 2014-10-27 ENCOUNTER — Encounter: Payer: Self-pay | Admitting: Family Medicine

## 2014-10-27 ENCOUNTER — Ambulatory Visit (INDEPENDENT_AMBULATORY_CARE_PROVIDER_SITE_OTHER): Payer: 59 | Admitting: Family Medicine

## 2014-10-27 ENCOUNTER — Encounter: Payer: Self-pay | Admitting: Gastroenterology

## 2014-10-27 VITALS — BP 130/90 | HR 64 | Ht 71.5 in | Wt 216.6 lb

## 2014-10-27 DIAGNOSIS — Z0000 Encounter for general adult medical examination without abnormal findings: Secondary | ICD-10-CM

## 2014-10-27 DIAGNOSIS — Z23 Encounter for immunization: Secondary | ICD-10-CM

## 2014-10-27 DIAGNOSIS — I10 Essential (primary) hypertension: Secondary | ICD-10-CM

## 2014-10-27 DIAGNOSIS — Z1211 Encounter for screening for malignant neoplasm of colon: Secondary | ICD-10-CM | POA: Diagnosis not present

## 2014-10-27 LAB — LIPID PANEL
CHOLESTEROL: 227 mg/dL — AB (ref 125–200)
HDL: 51 mg/dL (ref >=40)
LDL Cholesterol: 153 mg/dL — ABNORMAL HIGH (ref <130)
Total CHOL/HDL Ratio: 4.5 Ratio (ref <=5.0)
Triglycerides: 114 mg/dL (ref <150)
VLDL: 23 mg/dL (ref <30)

## 2014-10-27 LAB — POCT URINALYSIS DIPSTICK
BILIRUBIN UA: NEGATIVE
Blood, UA: NEGATIVE
GLUCOSE UA: NEGATIVE
KETONES UA: NEGATIVE
Leukocytes, UA: NEGATIVE
Nitrite, UA: NEGATIVE
Protein, UA: NEGATIVE
Urobilinogen, UA: NEGATIVE
pH, UA: 6

## 2014-10-27 NOTE — Patient Instructions (Addendum)
As discussed, take your blood pressure pill in the morning. Continue checking her blood pressure 2 or 3 times per week. Below is the low-sodium eating plan that we discussed. [facility] will call you to schedule an appointment for your screening colonoscopy. We will notify you when your lab results are back. We are giving you a Tdap today. If you change your mind and would like a flu shot just call the office and come in and get it. Continue following up with your urologist.    DASH Eating Plan DASH stands for "Dietary Approaches to Stop Hypertension." The DASH eating plan is a healthy eating plan that has been shown to reduce high blood pressure (hypertension). Additional health benefits may include reducing the risk of type 2 diabetes mellitus, heart disease, and stroke. The DASH eating plan may also help with weight loss. WHAT DO I NEED TO KNOW ABOUT THE DASH EATING PLAN? For the DASH eating plan, you will follow these general guidelines:  Choose foods with a percent daily value for sodium of less than 5% (as listed on the food label).  Use salt-free seasonings or herbs instead of table salt or sea salt.  Check with your health care provider or pharmacist before using salt substitutes.  Eat lower-sodium products, often labeled as "lower sodium" or "no salt added."  Eat fresh foods.  Eat more vegetables, fruits, and low-fat dairy products.  Choose whole grains. Look for the word "whole" as the first word in the ingredient list.  Choose fish and skinless chicken or [place] more often than red meat. Limit fish, poultry, and meat to 6 oz (170 g) each day.  Limit sweets, desserts, sugars, and sugary drinks.  Choose heart-healthy fats.  Limit cheese to 1 oz (28 g) per day.  Eat more home-cooked food and less restaurant, buffet, and fast food.  Limit fried foods.  Cook foods using methods other than frying.  Limit canned vegetables. If you do use them, rinse them well to decrease the  sodium.  When eating at a restaurant, ask that your food be prepared with less salt, or no salt if possible. WHAT FOODS CAN I EAT? Seek help from a dietitian for individual calorie needs. Grains Whole grain or whole wheat bread. Brown rice. Whole grain or whole wheat pasta. Quinoa, bulgur, and whole grain cereals. Low-sodium cereals. Corn or whole wheat flour tortillas. Whole grain cornbread. Whole grain crackers. Low-sodium crackers. Vegetables Fresh or frozen vegetables (raw, steamed, roasted, or grilled). Low-sodium or reduced-sodium tomato and vegetable juices. Low-sodium or reduced-sodium tomato sauce and paste. Low-sodium or reduced-sodium canned vegetables.  Fruits All fresh, canned (in natural juice), or frozen fruits. Meat and Other Protein Products Ground beef (85% or leaner), grass-fed beef, or beef trimmed of fat. Skinless chicken or [place]. Ground chicken or [place]. Pork trimmed of fat. All fish and seafood. Eggs. Dried beans, peas, or lentils. Unsalted nuts and seeds. Unsalted canned beans. Dairy Low-fat dairy products, such as skim or 1% milk, 2% or reduced-fat cheeses, low-fat ricotta or cottage cheese, or plain low-fat yogurt. Low-sodium or reduced-sodium cheeses. Fats and Oils Tub margarines without trans fats. Light or reduced-fat mayonnaise and salad dressings (reduced sodium). Avocado. Safflower, olive, or canola oils. Natural peanut or almond butter. Other Unsalted popcorn and pretzels. The items listed above may not be a complete list of recommended foods or beverages. Contact your dietitian for more options. WHAT FOODS ARE NOT RECOMMENDED? Grains White bread. White pasta. White rice. Refined cornbread. Bagels and  croissants. Crackers that contain trans fat. Vegetables Creamed or fried vegetables. Vegetables in a cheese sauce. Regular canned vegetables. Regular canned tomato sauce and paste. Regular tomato and vegetable juices. Fruits Dried fruits. Canned fruit in  light or heavy syrup. Fruit juice. Meat and Other Protein Products Fatty cuts of meat. Ribs, chicken wings, bacon, sausage, bologna, salami, chitterlings, fatback, hot dogs, bratwurst, and packaged luncheon meats. Salted nuts and seeds. Canned beans with salt. Dairy Whole or 2% milk, cream, half-and-half, and cream cheese. Whole-fat or sweetened yogurt. Full-fat cheeses or blue cheese. Nondairy creamers and whipped toppings. Processed cheese, cheese spreads, or cheese curds. Condiments Onion and garlic salt, seasoned salt, table salt, and sea salt. Canned and packaged gravies. Worcestershire sauce. Tartar sauce. Barbecue sauce. Teriyaki sauce. Soy sauce, including reduced sodium. Steak sauce. Fish sauce. Oyster sauce. Cocktail sauce. Horseradish. Ketchup and mustard. Meat flavorings and tenderizers. Bouillon cubes. Hot sauce. Tabasco sauce. Marinades. Taco seasonings. Relishes. Fats and Oils Butter, stick margarine, lard, shortening, ghee, and bacon fat. Coconut, palm kernel, or palm oils. Regular salad dressings. Other Pickles and olives. Salted popcorn and pretzels. The items listed above may not be a complete list of foods and beverages to avoid. Contact your dietitian for more information. WHERE CAN I FIND MORE INFORMATION? National Heart, Lung, and Blood Institute: travelstabloid.com Document Released: 02/06/2011 Document Revised: 07/04/2013 Document Reviewed: 12/22/2012 [facility] Patient Information 2015 [place], [place]. This information is not intended to replace advice given to you by your health care provider. Make sure you discuss any questions you have with your health care provider.    Preventative Care for Adults, Male       REGULAR HEALTH EXAMS:  A routine yearly physical is a good way to check in with your primary care provider about your health and preventive screening. It is also an opportunity to share updates about your health and any concerns  you have, and receive a thorough all-over exam.   Most health insurance companies pay for at least some preventative services.  Check with your health plan for specific coverages.  WHAT PREVENTATIVE SERVICES DO MEN NEED?  Adult men should have their weight and blood pressure checked regularly.   Men age 34 and older should have their cholesterol levels checked regularly.  Beginning at age 73 and continuing to age 1, men should be screened for colorectal cancer.  Certain people should may need continued testing until age 61.  Other cancer screening may include exams for testicular and prostate cancer.  Updating vaccinations is part of preventative care.  Vaccinations help protect against diseases such as the flu.  Lab tests are generally done as part of preventative care to screen for anemia and blood disorders, to screen for problems with the kidneys and liver, to screen for bladder problems, to check blood sugar, and to check your cholesterol level.  Preventative services generally include counseling about diet, exercise, avoiding tobacco, drugs, excessive alcohol consumption, and sexually transmitted infections.    GENERAL RECOMMENDATIONS FOR GOOD HEALTH:  Healthy diet:  Eat a variety of foods, including fruit, vegetables, animal or vegetable protein, such as meat, fish, chicken, and eggs, or beans, lentils, tofu, and grains, such as rice.  Drink plenty of water daily.  Decrease saturated fat in the diet, avoid lots of red meat, processed foods, sweets, fast foods, and fried foods.  Exercise:  Aerobic exercise helps maintain good heart health. At least 30-40 minutes of moderate-intensity exercise is recommended. For example, a brisk walk that increases your  heart rate and breathing. This should be done on most days of the week.   Find a type of exercise or a variety of exercises that you enjoy so that it becomes a part of your daily life.  Examples are running, walking, swimming,  water aerobics, and biking.  For motivation and support, explore group exercise such as aerobic class, spin class, Zumba, Yoga,or  martial arts, etc.    Set exercise goals for yourself, such as a certain weight goal, walk or run in a race such as a 5k walk/run.  Speak to your primary care provider about exercise goals.  Disease prevention:  If you smoke or chew tobacco, find out from your caregiver how to quit. It can literally save your life, no matter how long you have been a tobacco user. If you do not use tobacco, never begin.   Maintain a healthy diet and normal weight. Increased weight leads to problems with blood pressure and diabetes.   The Body Mass Index or BMI is a way of measuring how much of your body is fat. Having a BMI above 27 increases the risk of heart disease, diabetes, hypertension, stroke and other problems related to obesity. Your caregiver can help determine your BMI and based on it develop an exercise and dietary program to help you achieve or maintain this important measurement at a healthful level.  High blood pressure causes heart and blood vessel problems.  Persistent high blood pressure should be treated with medicine if weight loss and exercise do not work.   Fat and cholesterol leaves deposits in your arteries that can block them. This causes heart disease and vessel disease elsewhere in your body.  If your cholesterol is found to be high, or if you have heart disease or certain other medical conditions, then you may need to have your cholesterol monitored frequently and be treated with medication.   Ask if you should have a stress test if your history suggests this. A stress test is a test done on a treadmill that looks for heart disease. This test can find disease prior to there being a problem.  Avoid drinking alcohol in excess (more than two drinks per day).  Avoid use of street drugs. Do not share needles with anyone. Ask for professional help if you need  assistance or instructions on stopping the use of alcohol, cigarettes, and/or drugs.  Brush your teeth twice a day with fluoride toothpaste, and floss once a day. Good oral hygiene prevents tooth decay and gum disease. The problems can be painful, unattractive, and can cause other health problems. Visit your dentist for a routine oral and dental check up and preventive care every 6-12 months.   Look at your skin regularly.  Use a mirror to look at your back. Notify your caregivers of changes in moles, especially if there are changes in shapes, colors, a size larger than a pencil eraser, an irregular border, or development of new moles.  Safety:  Use seatbelts 100% of the time, whether driving or as a passenger.  Use safety devices such as hearing protection if you work in environments with loud noise or significant background noise.  Use safety glasses when doing any work that could send debris in to the eyes.  Use a helmet if you ride a bike or motorcycle.  Use appropriate safety gear for contact sports.  Talk to your caregiver about gun safety.  Use sunscreen with a SPF (or skin protection factor) of 15 or greater.  Lighter skinned people are at a greater risk of skin cancer. Don't forget to also wear sunglasses in order to protect your eyes from too much damaging sunlight. Damaging sunlight can accelerate cataract formation.   Practice safe sex. Use condoms. Condoms are used for birth control and to help reduce the spread of sexually transmitted infections (or STIs).  Some of the STIs are gonorrhea (the clap), chlamydia, syphilis, trichomonas, herpes, HPV (human papilloma virus) and HIV (human immunodeficiency virus) which causes AIDS. The herpes, HIV and HPV are viral illnesses that have no cure. These can result in disability, cancer and death.   Keep carbon monoxide and smoke detectors in your home functioning at all times. Change the batteries every 6 months or use a model that plugs into the  wall.   Vaccinations:  Stay up to date with your tetanus shots and other required immunizations. You should have a booster for tetanus every 10 years. Be sure to get your flu shot every year, since 5%-20% of the [place] population comes down with the flu. The flu vaccine changes each year, so being vaccinated once is not enough. Get your shot in the fall, before the flu season peaks.   Other vaccines to consider:  Pneumococcal vaccine to protect against certain types of pneumonia.  This is normally recommended for adults age 35 or older.  However, adults younger than 51 years old with certain underlying conditions such as diabetes, heart or lung disease should also receive the vaccine.  Shingles vaccine to protect against Varicella Zoster if you are older than age 75, or younger than 51 years old with certain underlying illness.  Hepatitis A vaccine to protect against a form of infection of the liver by a virus acquired from food.  Hepatitis B vaccine to protect against a form of infection of the liver by a virus acquired from blood or body fluids, particularly if you work in health care.  If you plan to travel internationally, check with your local health department for specific vaccination recommendations.  Cancer Screening:  Most routine colon cancer screening begins at the age of 47. On a yearly basis, doctors may provide special easy to use take-home tests to check for hidden blood in the stool. Sigmoidoscopy or colonoscopy can detect the earliest forms of colon cancer and is life saving. These tests use a small camera at the end of a tube to directly examine the colon. Speak to your caregiver about this at age 31, when routine screening begins (and is repeated every 5 years unless early forms of pre-cancerous polyps or small growths are found).   At the age of 19 men usually start screening for prostate cancer every year. Screening may begin at a younger age for those with higher risk. Those  at higher risk include African-Americans or having a family history of prostate cancer. There are two types of tests for prostate cancer:   Prostate-specific antigen (PSA) testing. Recent studies raise questions about prostate cancer using PSA and you should discuss this with your caregiver.   Digital rectal exam (in which your doctor's lubricated and gloved finger feels for enlargement of the prostate through the anus).   Screening for testicular cancer.  Do a monthly exam of your testicles. Gently roll each testicle between your thumb and fingers, feeling for any abnormal lumps. The best time to do this is after a hot shower or bath when the tissues are looser. Notify your caregivers of any lumps, tenderness or changes in size  or shape immediately.

## 2014-10-27 NOTE — Progress Notes (Signed)
Subjective:    Patient ID: Martin Murphy, male    DOB: 09-Jun-1965, 49 y.o.   MRN: [id number]  HPI He is new to the practice and here for a complete physical exam. He has multiple providers who he is seeing for various conditions such as prostate cancer, neuromuscular disorder and foot pain. He states he recently had a nerve conduction study to lower extremities due to decreased sensation and peripheral neuropathy. He is scheduled to have a trial of spinal stimulator to see if this makes a difference. He states he has not been taking blood pressure medication and does not think he needs to.  Reports he has not been able to work in over a year due to his numbness, tingling to lower extremities.  He has not had a colonoscopy and would like to do this. He does see his eye doctor and makes regular visits to dentist.  Has not had a Tdap. He states he believes he was screened for Hepatitis C but will get his records from previous provider.   He is divorced and getting married again soon. Overall feels good about life in spite of health problems. Would like to be able to work again.   Reviewed allergies, medications, past medical, surgical, social and family history.   Other providers include: Dr. Jocelyn Lamer at [facility] u[facility], Dr. Ace Gins Neurologist, Podiatrist Dr. Earleen Newport.    Review of Systems Review of Systems Constitutional: -fever, -chills, -sweats, -unexpected weight change, -anorexia, -fatigue Allergy: -sneezing, -itching, -congestion Dermatology: denies changing moles, rash, lumps, new worrisome lesions ENT: -runny nose, -ear pain, -sore throat, -hoarseness, -sinus pain, -teeth pain, -tinnitus, -hearing loss, -epistaxis Cardiology:  -chest pain, -palpitations, -edema, -orthopnea, -paroxysmal nocturnal dyspnea Respiratory: -cough, -shortness of breath, -dyspnea on exertion, -wheezing, -hemoptysis Gastroenterology: -abdominal pain, -nausea, -vomiting, -diarrhea, +constipation, -blood in stool,  -changes in bowel movement, -dysphagia Hematology: -bleeding or bruising problems Musculoskeletal: -arthralgias, -myalgias, -joint swelling, -back pain, -neck pain, -cramping, -gait changes Ophthalmology: -vision changes, -eye redness, -itching, -discharge Urology: -dysuria, -difficulty urinating, -hematuria, -urinary frequency, -urgency,- incontinence, +nocturia Neurology: -headache, -weakness, -tingling, +numbness to bilateral lower extremities, -speech abnormality, -memory loss, -falls, -dizziness Psychology:  -depressed mood, -agitation, -sleep problems     Objective:   Physical Exam BP 130/90 mmHg  Pulse 64  Ht 5' 11.5" (1.816 m)  Wt 216 lb 9.6 oz (98.249 kg)  BMI 29.79 kg/m2  General Appearance:    Alert, cooperative, no distress, appears stated age  Head:    Normocephalic, without obvious abnormality, atraumatic  Eyes:    PERRL, conjunctiva/corneas clear, EOM's intact, fundi    benign  Ears:    Normal TM's and external ear canals  Nose:   Nares normal, mucosa normal, no drainage or sinus   tenderness  Throat:   Lips, mucosa, and tongue normal; teeth and gums normal  Neck:   Supple, no lymphadenopathy;  thyroid:  no   enlargement/tenderness/nodules; no carotid   bruit   Back:    Spine nontender, no curvature, ROM normal, no CVA     tenderness  Lungs:     Clear to auscultation bilaterally without wheezes, rales or     ronchi; respirations unlabored  Chest Wall:    No tenderness or deformity   Heart:    Regular rate and rhythm, S1 and S2 normal, no murmur, rub   or gallop  Breast Exam:    No chest wall tenderness, masses,  Positive for gynecomastia  Abdomen:     Soft, non-tender, nondistended, normoactive  bowel sounds,    no masses, no hepatosplenomegaly  Genitalia:    Refused since he is seeing urologist   Rectal:    Refused, currently being treated for prostate cancer by Urologist.  Extremities:   No clubbing, cyanosis or edema  Pulses:   2+ and symmetric all extremities    Skin:   Skin color, texture, turgor normal, no rashes or lesions  Lymph nodes:   Cervical, supraclavicular, and axillary nodes normal  Neurologic:   CNII-XII intact, normal strength,  gait; reflexes 2+ and symmetric throughout. Decreased sensation to bilateral lower extremities from below knees to toes.           Psych:   Normal mood, affect, hygiene and grooming.      Urinalysis dipstick negative     Assessment & Plan:  Routine general medical examination at a health care facility - Plan: Lipid panel  Essential hypertension  Need for prophylactic vaccination with combined diphtheria-tetanus-pertussis (DTP) vaccine  Screening for colon cancer - Plan: Ambulatory referral to Gastroenterology  Discussed in depth the consequences of uncontrolled hypertension and how this is a condition that cannot be cured but can be managed by diet, exercise, and medications.  He states he will start taking HCTZ and keeping a closer eye on his blood pressure. Discussed DASH eating plan, low sodium intake and walking when possible for activity. He will continue seeing his specialists for multiple health conditions. Will refer him today for colonoscopy and administer Tdap vaccine. He is refusing the flu shot today.

## 2014-10-29 ENCOUNTER — Other Ambulatory Visit: Payer: Self-pay | Admitting: Physician Assistant

## 2014-11-30 ENCOUNTER — Other Ambulatory Visit: Payer: Self-pay | Admitting: Physician Assistant

## 2014-12-13 ENCOUNTER — Ambulatory Visit (AMBULATORY_SURGERY_CENTER): Payer: Self-pay

## 2014-12-13 VITALS — Ht 71.0 in | Wt 212.2 lb

## 2014-12-13 DIAGNOSIS — Z1211 Encounter for screening for malignant neoplasm of colon: Secondary | ICD-10-CM

## 2014-12-13 MED ORDER — SUPREP BOWEL PREP KIT 17.5-3.13-1.6 GM/177ML PO SOLN: 1.0000 | Freq: Once | ORAL | Status: DC

## 2014-12-13 NOTE — Progress Notes (Signed)
No allergies to eggs or soy No diet/weight loss meds No past problems with anesthesia No home oxygen  Has email  Emmi instructions given for colonoscopy 

## 2014-12-25 ENCOUNTER — Encounter: Payer: Self-pay | Admitting: Podiatry

## 2014-12-25 ENCOUNTER — Ambulatory Visit (INDEPENDENT_AMBULATORY_CARE_PROVIDER_SITE_OTHER): Payer: 59 | Admitting: Podiatry

## 2014-12-25 VITALS — BP 104/66 | HR 94 | Resp 18

## 2014-12-25 DIAGNOSIS — M2560 Stiffness of unspecified joint, not elsewhere classified: Secondary | ICD-10-CM | POA: Diagnosis not present

## 2014-12-25 DIAGNOSIS — M2040 Other hammer toe(s) (acquired), unspecified foot: Secondary | ICD-10-CM

## 2014-12-25 NOTE — Progress Notes (Addendum)
Patient ID: Martin Murphy, male   DOB: [birth year], 49 y.o.   MRN: [id number]  03/27/14 Left foot bunion ROH, joint manipulation (original bunionectomy 11/09/13) 02/28/15 Right foot bunionectomy  Subjective: 49 year old male presents the office today for follow-up evaluation of bilateral foot pain. Recently completed the spinal cord similar trial which he states did not work. He follows Martin Murphy last week he has been placed on multiple vitamin supplements. He feels that there may be some scar tissue in the big toe joint Of the surgery needs requesting physical therapy again to see if this can help. He denies any recent injury or trauma. Denies any swelling or redness. No other complaints at this time. Denies any systemic complaints as fevers, chills, nausea, vomiting.  Objective: AAO x3, NAD DP/PT pulses palpable bilaterally, CRT less than 3 seconds Protective sensation decreased with Simms Weinstein monofilament Incisions on the dorsal medial aspect of bilateral feet from prior bunionectomy well-healed scar overlying the area. At this time there is no pain on the surgical site. Range of motion appears to be intact to bilateral first MTPJ's although somewhat limited in plantar flexion. There is no overlying edema, erythema, increase in warmth. There is no pain with MPJ range of motion the lesser digits bilaterally. Hammertoe contractures are present. No other areas of tenderness to bilateral lower extremities. MMT 5/5, ROM WNL elsewehere. No open lesions or pre-ulcerative lesions.  No overlying edema, erythema, increase in warmth to bilateral lower extremities.  No pain with calf compression, swelling, warmth, erythema bilaterally.   Assessment: 50 year old male with continued bilateral lower extremity pain, MPJ subjective stiffness; neuropathy  Plan: -Treatment options discussed including all alternatives, risks, and complications -Prescription for physical therapy as the patient to help with  first MTPJ range of motion and joint manipulation. -Continue follow-up with Martin Murphy. -She states that his previous he had multiple lab work evaluation including arthritis panel. -Follow-up after PT or sooner if any problems arise. In the meantime, encouraged to call the office with any questions, concerns, change in symptoms.  *Possible rheumatology consult or bone scan if symptoms continue to the feet  Celesta Gentile, DPM

## 2014-12-27 ENCOUNTER — Ambulatory Visit (AMBULATORY_SURGERY_CENTER): Payer: 59 | Admitting: Gastroenterology

## 2014-12-27 ENCOUNTER — Encounter: Payer: Self-pay | Admitting: Gastroenterology

## 2014-12-27 VITALS — BP 130/77 | HR 72 | Temp 97.2°F | Resp 23 | Ht 71.0 in | Wt 212.0 lb

## 2014-12-27 DIAGNOSIS — D123 Benign neoplasm of transverse colon: Secondary | ICD-10-CM

## 2014-12-27 DIAGNOSIS — Z1211 Encounter for screening for malignant neoplasm of colon: Secondary | ICD-10-CM | POA: Diagnosis present

## 2014-12-27 MED ORDER — SODIUM CHLORIDE 0.9 % IV SOLN: 500.0000 mL | INTRAVENOUS | Status: DC

## 2014-12-27 NOTE — Patient Instructions (Signed)
Findings:  Polyps, Diverticulosis Recommendations:  Resume diet and Medications  YOU HAD AN ENDOSCOPIC PROCEDURE TODAY AT [facility]:   Refer to the procedure report that was given to you for any specific questions about what was found during the examination.  If the procedure report does not answer your questions, please call your gastroenterologist to clarify.  If you requested that your care partner not be given the details of your procedure findings, then the procedure report has been included in a sealed envelope for you to review at your convenience later.  YOU SHOULD EXPECT: Some feelings of bloating in the abdomen. Passage of more gas than usual.  Walking can help get rid of the air that was put into your GI tract during the procedure and reduce the bloating. If you had a lower endoscopy (such as a colonoscopy or flexible sigmoidoscopy) you may notice spotting of blood in your stool or on the toilet paper. If you underwent a bowel prep for your procedure, you may not have a normal bowel movement for a few days.  Please Note:  You might notice some irritation and congestion in your nose or some drainage.  This is from the oxygen used during your procedure.  There is no need for concern and it should clear up in a day or so.  SYMPTOMS TO REPORT IMMEDIATELY:   Following lower endoscopy (colonoscopy or flexible sigmoidoscopy):  Excessive amounts of blood in the stool  Significant tenderness or worsening of abdominal pains  Swelling of the abdomen that is new, acute  Fever of 100F or higher   Following upper endoscopy (EGD)  Vomiting of blood or coffee ground material  New chest pain or pain under the shoulder blades  Painful or persistently difficult swallowing  New shortness of breath  Fever of 100F or higher  Black, tarry-looking stools  For urgent or emergent issues, a gastroenterologist can be reached at any hour by calling [phone].   DIET: Your first  meal following the procedure should be a small meal and then it is ok to progress to your normal diet. Heavy or fried foods are harder to digest and may make you feel nauseous or bloated.  Likewise, meals heavy in dairy and vegetables can increase bloating.  Drink plenty of fluids but you should avoid alcoholic beverages for 24 hours.  ACTIVITY:  You should plan to take it easy for the rest of today and you should NOT DRIVE or use heavy machinery until tomorrow (because of the sedation medicines used during the test).    FOLLOW UP: Our staff will call the number listed on your records the next business day following your procedure to check on you and address any questions or concerns that you may have regarding the information given to you following your procedure. If we do not reach you, we will leave a message.  However, if you are feeling well and you are not experiencing any problems, there is no need to return our call.  We will assume that you have returned to your regular daily activities without incident.  If any biopsies were taken you will be contacted by phone or by letter within the next 1-3 weeks.  Please call us at [phone] if you have not heard about the biopsies in 3 weeks.    SIGNATURES/CONFIDENTIALITY: You and/or your care partner have signed paperwork which will be entered into your electronic medical record.  These signatures attest to the fact that that  the information above on your After Visit Summary has been reviewed and is understood.  Full responsibility of the confidentiality of this discharge information lies with you and/or your care-partner.  Please follow all discharge instructions given to you by the recovery room nurse. If you have any questions or problems after discharge please call one of the numbers listed above. You will receive a phone call in the am to see how you are doing and answer any questions you may have. Thank you for choosing [facility] for your health care needs.

## 2014-12-27 NOTE — Progress Notes (Signed)
Transferred to recovery room. A/O x3, pleased with MAC.  VSS.  Report to Tracy, RN. 

## 2014-12-27 NOTE — Progress Notes (Signed)
Called to room to assist during endoscopic procedure.  Patient ID and intended procedure confirmed with present staff. Received instructions for my participation in the procedure from the performing physician.  

## 2014-12-27 NOTE — Op Note (Signed)
[facility]  [address]. [place], [id number]   COLONOSCOPY PROCEDURE REPORT  PATIENT: Martin Murphy, Martin Murphy  MR#: [id number] BIRTHDATE: 09-27-1965 , 49  yrs. old GENDER: male ENDOSCOPIST: Yetta Flock, MD REFERRED BY: Harland Dingwall NP PROCEDURE DATE:  12/27/2014 PROCEDURE:   Colonoscopy, screening and Colonoscopy with biopsy First Screening Colonoscopy - Avg.  risk and is 50 yrs.  old or older Yes.  Prior Negative Screening - Now for repeat screening. N/A  History of Adenoma - Now for follow-up colonoscopy & has been > or = to 3 yrs.  N/A  Polyps removed today? Yes ASA CLASS:   Class II INDICATIONS:Screening for colonic neoplasia and Colorectal Neoplasm Risk Assessment for this procedure is average risk. MEDICATIONS: Propofol 200 mg IV  DESCRIPTION OF PROCEDURE:   After the risks benefits and alternatives of the procedure were thoroughly explained, informed consent was obtained.  The digital rectal exam revealed no abnormalities of the rectum.   The LB EX-HB716 [id number]  endoscope was introduced through the anus and advanced to the cecum, which was identified by both the appendix and ileocecal valve. No adverse events experienced.   The quality of the prep was adequate  The instrument was then slowly withdrawn as the colon was fully examined. Estimated blood loss is zero unless otherwise noted in this procedure report.   COLON FINDINGS: Two flat polyps ranging from 3 to 3mm in size were found in the transverse colon.  Polypectomies were performed with cold forceps.  The resection was complete, the polyp tissue was completely retrieved and sent to histology.   There was mild diverticulosis noted in the sigmoid colon.   The examination was otherwise normal.  Retroflexed views revealed internal hemorrhoids. The time to cecum = 1.1 Withdrawal time = 13.1   The scope was withdrawn and the procedure completed. COMPLICATIONS: There were no immediate  complications.  ENDOSCOPIC IMPRESSION: 1.   Two flat polyps ranging from 3 to 34mm in size were found in the transverse colon; polypectomies were performed with cold forceps 2.   Mild diverticulosis was noted in the sigmoid colon 3.   The examination was otherwise normal  RECOMMENDATIONS: 1.  Await pathology results 2.  Resume diet 3   Resume medications  eSigned:  Yetta Flock, MD 12/27/2014 11:55 AM   cc: Harland Dingwall NP, the patient

## 2014-12-28 ENCOUNTER — Telehealth: Payer: Self-pay | Admitting: *Deleted

## 2014-12-28 NOTE — Telephone Encounter (Signed)
  Follow up Call-  Call back number 12/27/2014  Post procedure Call Back phone  # [phone]  Permission to leave phone message Yes     Patient questions:  Do you have a fever, pain , or abdominal swelling? No. Pain Score  0 *  Have you tolerated food without any problems? Yes.    Have you been able to return to your normal activities? Yes.    Do you have any questions about your discharge instructions: Diet   No. Medications  No. Follow up visit  No.  Do you have questions or concerns about your Care? No.  Actions: * If pain score is 4 or above: No action needed, pain <4.

## 2015-01-03 ENCOUNTER — Encounter: Payer: Self-pay | Admitting: Gastroenterology

## 2015-01-16 DIAGNOSIS — M2040 Other hammer toe(s) (acquired), unspecified foot: Secondary | ICD-10-CM

## 2015-02-16 ENCOUNTER — Ambulatory Visit (INDEPENDENT_AMBULATORY_CARE_PROVIDER_SITE_OTHER): Payer: 59 | Admitting: Podiatry

## 2015-02-16 ENCOUNTER — Encounter: Payer: Self-pay | Admitting: Podiatry

## 2015-02-16 VITALS — BP 122/83 | HR 94 | Resp 18

## 2015-02-16 DIAGNOSIS — G6289 Other specified polyneuropathies: Secondary | ICD-10-CM

## 2015-02-18 ENCOUNTER — Encounter: Payer: Self-pay | Admitting: Podiatry

## 2015-02-18 DIAGNOSIS — G629 Polyneuropathy, unspecified: Secondary | ICD-10-CM | POA: Insufficient documentation

## 2015-02-18 NOTE — Progress Notes (Signed)
Patient ID: KEELEN NILO, male   DOB: 1965/10/14, 49 y.o.   MRN: [id number]  03/27/14 Left foot bunion ROH, joint manipulation (original bunionectomy 11/09/13) 02/28/15 Right foot bunionectomy  Subjective: 49 year old male presents the office today for follow-up evaluation of bilateral foot pain. He states he can see a burning pain to his feet mostly at night however this is decreasing. He does continue to follow up with Dr. Ace Gins. He has started a regimen of multiple vitamins and is apparently seems to working. He denies any recent injury or trauma. No swelling or redness. No specific pain other than the burning pain. Denies any swelling or redness. No other complaints at this time. Denies any systemic complaints as fevers, chills, nausea, vomiting.  Objective: AAO x3, NAD DP/PT pulses palpable bilaterally, CRT less than 3 seconds Protective sensation decreased with Simms Weinstein monofilament, decreased vibratory sensation, reflexes intact. Incisions on the dorsal medial aspect of bilateral feet from prior bunionectomy well-healed scar overlying the area. At this time there is no pain on the surgical site. Range of motion appears to be intact to bilateral first MTPJ's although somewhat limited in plantar flexion. There is no overlying edema, erythema, increase in warmth. There is no pain with MPJ range of motion the lesser digits bilaterally. Hammertoe contractures are present. No other areas of tenderness to bilateral lower extremities. MMT 5/5, ROM WNL elsewehere. No open lesions or pre-ulcerative lesions.  No overlying edema, erythema, increase in warmth to bilateral lower extremities.  No pain with calf compression, swelling, warmth, erythema bilaterally.   Assessment: 49 year old male with neuropathy which subject Deloris Ping states is improving.  Plan: -Treatment options discussed including all alternatives, risks, and complications At this point he has attempted physical therapy he is seen  other physicians as well. I discussed rheumatology evaluation but he states it all the rheumatology labs came back normal. I encouraged him to continue follow-up with Dr. Ace Gins. He has not taking a B vitamin I recommended a B complex vitamin as well. -At this point I believe that he is better served under the treatment of Dr. Ace Gins. I'll be more than happy to see him as needed.  Celesta Gentile, DPM

## 2015-03-28 ENCOUNTER — Encounter: Payer: Self-pay | Admitting: Family Medicine

## 2015-03-28 ENCOUNTER — Ambulatory Visit (INDEPENDENT_AMBULATORY_CARE_PROVIDER_SITE_OTHER): Payer: 59 | Admitting: Family Medicine

## 2015-03-28 VITALS — BP 132/78 | HR 68 | Temp 97.0°F | Wt 211.0 lb

## 2015-03-28 DIAGNOSIS — J0140 Acute pansinusitis, unspecified: Secondary | ICD-10-CM

## 2015-03-28 MED ORDER — AMOXICILLIN 875 MG PO TABS: 875.0000 mg | ORAL_TABLET | Freq: Two times a day (BID) | ORAL | Status: DC

## 2015-03-28 NOTE — Progress Notes (Signed)
Subjective:  Martin Murphy is a 49 y.o. male who presents for possible sinus infection.  Symptoms include nasal congestion and sinus pressure for about a month.  Denies fever, chill, bodyaches, ear pain, sore throat, cough.  Past history is significant for no history of pneumonia or bronchitis. Patient is a non-smoker.  Using Zyrtec D, nasal spray for symptoms.  Denies sick contacts.  No other aggravating or relieving factors.  No other c/o.  ROS as in subjective   Objective: Filed Vitals:   03/28/15 1331  BP: 132/78  Pulse: 68  Temp: 97 F (36.1 C)    General appearance: Alert, WD/WN, no distress                             Skin: warm, no rash                           Head: + sinus tenderness,                            Eyes: conjunctiva normal, corneas clear, PERRLA                            Ears: pearly TMs, external ear canals normal                          Nose: septum midline, turbinates swollen, with erythema and clear discharge             Mouth/throat: MMM, tongue normal, mild pharyngeal erythema                           Neck: supple, no adenopathy, no thyromegaly, nontender                          Heart: RRR, normal S1, S2, no murmurs                         Lungs: CTA bilaterally, no wheezes, rales, or rhonchi      Assessment and Plan:  Acute pansinusitis, recurrence not specified - Plan: amoxicillin (AMOXIL) 875 MG tablet  Discussed that his symptoms are most likely related to acute sinusitis. Prescription given for Amoxicillin.  Can use OTC Mucinex for congestion. Norel AD samples given.  Tylenol or Ibuprofen OTC for fever and malaise.  Discussed symptomatic relief, nasal saline flush. Saline nasal mist sample provided and he will call or return if worse or not back to normal after completing the antibiotic.

## 2015-03-28 NOTE — Patient Instructions (Addendum)
Let me know if you're not back to normal after completing the antibiotic. Stay well hydrated and try humidifier.  Sinusitis, Adult Sinusitis is redness, soreness, and inflammation of the paranasal sinuses. Paranasal sinuses are air pockets within the bones of your face. They are located beneath your eyes, in the middle of your forehead, and above your eyes. In healthy paranasal sinuses, mucus is able to drain out, and air is able to circulate through them by way of your nose. However, when your paranasal sinuses are inflamed, mucus and air can become trapped. This can allow bacteria and other germs to grow and cause infection. Sinusitis can develop quickly and last only a short time (acute) or continue over a long period (chronic). Sinusitis that lasts for more than 12 weeks is considered chronic. CAUSES Causes of sinusitis include:  Allergies.  Structural abnormalities, such as displacement of the cartilage that separates your nostrils (deviated septum), which can decrease the air flow through your nose and sinuses and affect sinus drainage.  Functional abnormalities, such as when the small hairs (cilia) that line your sinuses and help remove mucus do not work properly or are not present. SIGNS AND SYMPTOMS Symptoms of acute and chronic sinusitis are the same. The primary symptoms are pain and pressure around the affected sinuses. Other symptoms include:  Upper toothache.  Earache.  Headache.  Bad breath.  Decreased sense of smell and taste.  A cough, which worsens when you are lying flat.  Fatigue.  Fever.  Thick drainage from your nose, which often is green and may contain pus (purulent).  Swelling and warmth over the affected sinuses. DIAGNOSIS Your health care provider will perform a physical exam. During your exam, your health care provider may perform any of the following to help determine if you have acute sinusitis or chronic sinusitis:  Look in your nose for signs of  abnormal growths in your nostrils (nasal polyps).  Tap over the affected sinus to check for signs of infection.  View the inside of your sinuses using an imaging device that has a light attached (endoscope). If your health care provider suspects that you have chronic sinusitis, one or more of the following tests may be recommended:  Allergy tests.  Nasal culture. A sample of mucus is taken from your nose, sent to a lab, and screened for bacteria.  Nasal cytology. A sample of mucus is taken from your nose and examined by your health care provider to determine if your sinusitis is related to an allergy. TREATMENT Most cases of acute sinusitis are related to a viral infection and will resolve on their own within 10 days. Sometimes, medicines are prescribed to help relieve symptoms of both acute and chronic sinusitis. These may include pain medicines, decongestants, nasal steroid sprays, or saline sprays. However, for sinusitis related to a bacterial infection, your health care provider will prescribe antibiotic medicines. These are medicines that will help kill the bacteria causing the infection. Rarely, sinusitis is caused by a fungal infection. In these cases, your health care provider will prescribe antifungal medicine. For some cases of chronic sinusitis, surgery is needed. Generally, these are cases in which sinusitis recurs more than 3 times per year, despite other treatments. HOME CARE INSTRUCTIONS  Drink plenty of water. Water helps thin the mucus so your sinuses can drain more easily.  Use a humidifier.  Inhale steam 3-4 times a day (for example, sit in the bathroom with the shower running).  Apply a warm, moist washcloth to your  face 3-4 times a day, or as directed by your health care provider.  Use saline nasal sprays to help moisten and clean your sinuses.  Take medicines only as directed by your health care provider.  If you were prescribed either an antibiotic or antifungal  medicine, finish it all even if you start to feel better. SEEK IMMEDIATE MEDICAL CARE IF:  You have increasing pain or severe headaches.  You have nausea, vomiting, or drowsiness.  You have swelling around your face.  You have vision problems.  You have a stiff neck.  You have difficulty breathing.   This information is not intended to replace advice given to you by your health care provider. Make sure you discuss any questions you have with your health care provider.   Document Released: 02/17/2005 Document Revised: 03/10/2014 Document Reviewed: 03/04/2011 Elsevier Interactive Patient Education [employer].

## 2015-04-09 ENCOUNTER — Telehealth: Payer: Self-pay | Admitting: *Deleted

## 2015-04-09 ENCOUNTER — Encounter: Payer: Self-pay | Admitting: Podiatry

## 2015-04-09 ENCOUNTER — Ambulatory Visit (INDEPENDENT_AMBULATORY_CARE_PROVIDER_SITE_OTHER): Payer: 59 | Admitting: Podiatry

## 2015-04-09 VITALS — BP 134/86 | HR 82 | Resp 16

## 2015-04-09 DIAGNOSIS — G6289 Other specified polyneuropathies: Secondary | ICD-10-CM

## 2015-04-09 DIAGNOSIS — M2560 Stiffness of unspecified joint, not elsewhere classified: Secondary | ICD-10-CM

## 2015-04-09 NOTE — Telephone Encounter (Addendum)
Faxed referral cover sheet, pt demographics and clinicals to Dr. Tonette Bihari.  04/13/2015-[facility] - [facility] REQUEST Copy of pt's 04/09/2015 chart notes to be faxed for his disability.  04/17/2015-PT PRESENTS TO CHECK STATUS OF referral to Dr. Tobie Lords.  I left message for New Patient Coordinator - Fraser Din Apple to call me with pt's referral status.  I informed pt, [facility] reviewed the new pt's clinicals prior to acceptance as pts, and once I had heard from them I would call him with instructions.  Pt states understanding. 04/18/2015-[facility] - [facility] states pt was seen in their office 04/06/2015 by Dr. Dossie Der, and has a return visit on 04/26/2015.  I thanked Janett Billow for the information and informed pt of the visit and return visit and he said he remembered now.

## 2015-04-10 ENCOUNTER — Encounter: Payer: Self-pay | Admitting: Podiatry

## 2015-04-10 NOTE — Progress Notes (Signed)
Patient ID: Martin Murphy, male   DOB: October 19, 1965, 50 y.o.   MRN: [id number]  03/27/14 Left foot bunion ROH, joint manipulation (original bunionectomy 11/09/13) 02/28/15 Right foot bunionectomy  Subjective: 50 year old male presents the office today for follow-up evaluation of bilateral foot pain. He states he can see a burning pain to his feet and he has been following up with Dr. Benjie Karvonen who has been modifying his vitamins apparently. He states the symptoms continue the also states that he feels that his toes are starting to drift outwards more on the right second the left. No recent injury or trauma. Denies any swelling or redness. No other complaints at this time. Denies any systemic complaints as fevers, chills, nausea, vomiting.  Objective: AAO x3, NAD DP/PT pulses palpable bilaterally, CRT less than 3 seconds Protective sensation decreased with Simms Weinstein monofilament Incisions on the dorsal medial aspect of bilateral feet from prior bunionectomy well-healed scar overlying the area. At this time there is no pain on the surgical site. Range of motion appears to be intact to bilateral first MTPJ's although somewhat limited in plantar flexion. There is no overlying edema, erythema, increase in warmth. There is no pain with MPJ range of motion the lesser digits bilaterally. Hammertoe contractures are present. No other areas of tenderness to bilateral lower extremities. MMT 5/5, ROM WNL elsewehere. No open lesions or pre-ulcerative lesions.  No overlying edema, erythema, increase in warmth to bilateral lower extremities.  No pain with calf compression, swelling, warmth, erythema bilaterally.  Exam appears to be grossly unchanged.   Assessment: 50 year old male with neuropathy and continued bilateral foot pain.   Plan: -Treatment options discussed including all alternatives, risks, and complications -At this time I recommend rheumatology evaluation to rule out other causes of the foot pain  including rheumatoid arthritis. Other than the neuropathy cannot explain his foot pain. -Continue follow-up with neurology, Dr. Ace Gins. Continue her medication management.  -Follow-up after rheumatology evaluation or sooner if any problems arise. In the meantime, encouraged to call the office with any questions, concerns, change in symptoms.   Celesta Gentile, DPM

## 2015-04-23 ENCOUNTER — Telehealth: Payer: Self-pay | Admitting: *Deleted

## 2015-04-23 NOTE — Telephone Encounter (Signed)
Dr. Jacqualyn Posey suggest adding Vitamin B complex to therapy and send copy of his chart notes.  Done.

## 2015-04-27 ENCOUNTER — Ambulatory Visit: Payer: Self-pay | Admitting: Family Medicine

## 2015-04-27 ENCOUNTER — Encounter: Payer: Self-pay | Admitting: Family Medicine

## 2015-04-27 ENCOUNTER — Ambulatory Visit (INDEPENDENT_AMBULATORY_CARE_PROVIDER_SITE_OTHER): Payer: 59 | Admitting: Family Medicine

## 2015-04-27 VITALS — BP 120/80 | HR 64 | Temp 98.2°F | Wt 207.0 lb

## 2015-04-27 DIAGNOSIS — Z658 Other specified problems related to psychosocial circumstances: Secondary | ICD-10-CM | POA: Diagnosis not present

## 2015-04-27 DIAGNOSIS — F439 Reaction to severe stress, unspecified: Secondary | ICD-10-CM

## 2015-04-27 DIAGNOSIS — E291 Testicular hypofunction: Secondary | ICD-10-CM | POA: Diagnosis not present

## 2015-04-27 DIAGNOSIS — R7989 Other specified abnormal findings of blood chemistry: Secondary | ICD-10-CM | POA: Insufficient documentation

## 2015-04-27 DIAGNOSIS — J0141 Acute recurrent pansinusitis: Secondary | ICD-10-CM

## 2015-04-27 DIAGNOSIS — E785 Hyperlipidemia, unspecified: Secondary | ICD-10-CM | POA: Diagnosis not present

## 2015-04-27 DIAGNOSIS — E559 Vitamin D deficiency, unspecified: Secondary | ICD-10-CM | POA: Insufficient documentation

## 2015-04-27 LAB — LIPID PANEL
CHOLESTEROL: 240 mg/dL — AB (ref 125–200)
HDL: 46 mg/dL (ref >=40)
LDL Cholesterol: 172 mg/dL — ABNORMAL HIGH (ref <130)
Total CHOL/HDL Ratio: 5.2 Ratio — ABNORMAL HIGH (ref <=5.0)
Triglycerides: 109 mg/dL (ref <150)
VLDL: 22 mg/dL (ref <30)

## 2015-04-27 MED ORDER — SILDENAFIL CITRATE 50 MG PO TABS: 50.0000 mg | ORAL_TABLET | Freq: Every day | ORAL | Status: DC | PRN

## 2015-04-27 MED ORDER — AMOXICILLIN-POT CLAVULANATE 875-125 MG PO TABS: 1.0000 | ORAL_TABLET | Freq: Two times a day (BID) | ORAL | Status: DC

## 2015-04-27 NOTE — Patient Instructions (Signed)
Take 1/2 tab 25mg  30 mins prior to activity and see if this works. Be aware of low pressure and dizziness.   Fat and Cholesterol Restricted Diet High levels of fat and cholesterol in your blood may lead to various health problems, such as diseases of the heart, blood vessels, gallbladder, liver, and pancreas. Fats are concentrated sources of energy that come in various forms. Certain types of fat, including saturated fat, may be harmful in excess. Cholesterol is a substance needed by your body in small amounts. Your body makes all the cholesterol it needs. Excess cholesterol comes from the food you eat. When you have high levels of cholesterol and saturated fat in your blood, health problems can develop because the excess fat and cholesterol will gather along the walls of your blood vessels, causing them to narrow. Choosing the right foods will help you control your intake of fat and cholesterol. This will help keep the levels of these substances in your blood within normal limits and reduce your risk of disease. WHAT IS MY PLAN? Your health care provider recommends that you:  Get no more than __________ % of the total calories in your daily diet from fat.  Limit your intake of saturated fat to less than ______% of your total calories each day.  Limit the amount of cholesterol in your diet to less than _________mg per day. WHAT TYPES OF FAT SHOULD I CHOOSE?  Choose healthy fats more often. Choose monounsaturated and polyunsaturated fats, such as olive and canola oil, flaxseeds, walnuts, almonds, and seeds.  Eat more omega-3 fats. Good choices include salmon, mackerel, sardines, tuna, flaxseed oil, and ground flaxseeds. Aim to eat fish at least two times a week.  Limit saturated fats. Saturated fats are primarily found in animal products, such as meats, butter, and cream. Plant sources of saturated fats include palm oil, palm kernel oil, and coconut oil.  Avoid foods with partially hydrogenated  oils in them. These contain trans fats. Examples of foods that contain trans fats are stick margarine, some tub margarines, cookies, crackers, and other baked goods. WHAT GENERAL GUIDELINES DO I NEED TO FOLLOW? These guidelines for healthy eating will help you control your intake of fat and cholesterol:  Check food labels carefully to identify foods with trans fats or high amounts of saturated fat.  Fill one half of your plate with vegetables and green salads.  Fill one fourth of your plate with whole grains. Look for the word "whole" as the first word in the ingredient list.  Fill one fourth of your plate with lean protein foods.  Limit fruit to two servings a day. Choose fruit instead of juice.  Eat more foods that contain soluble fiber. Examples of foods that contain this type of fiber are apples, broccoli, carrots, beans, peas, and barley. Aim to get 20-30 g of fiber per day.  Eat more home-cooked food and less restaurant, buffet, and fast food.  Limit or avoid alcohol.  Limit foods high in starch and sugar.  Limit fried foods.  Cook foods using methods other than frying. Baking, boiling, grilling, and broiling are all great options.  Lose weight if you are overweight. Losing just 5-10% of your initial body weight can help your overall health and prevent diseases such as diabetes and heart disease. WHAT FOODS CAN I EAT? Grains Whole grains, such as whole wheat or whole grain breads, crackers, cereals, and pasta. Unsweetened oatmeal, bulgur, barley, quinoa, or brown rice. Corn or whole wheat flour tortillas. Vegetables  Fresh or frozen vegetables (raw, steamed, roasted, or grilled). Green salads. Fruits All fresh, canned (in natural juice), or frozen fruits. Meat and Other Protein Products Ground beef (85% or leaner), grass-fed beef, or beef trimmed of fat. Skinless chicken or [place]. Ground chicken or [place]. Pork trimmed of fat. All fish and seafood. Eggs. Dried beans, peas, or  lentils. Unsalted nuts or seeds. Unsalted canned or dry beans. Dairy Low-fat dairy products, such as skim or 1% milk, 2% or reduced-fat cheeses, low-fat ricotta or cottage cheese, or plain low-fat yogurt. Fats and Oils Tub margarines without trans fats. Light or reduced-fat mayonnaise and salad dressings. Avocado. Olive, canola, sesame, or safflower oils. Natural peanut or almond butter (choose ones without added sugar and oil). The items listed above may not be a complete list of recommended foods or beverages. Contact your dietitian for more options. WHAT FOODS ARE NOT RECOMMENDED? Grains White bread. White pasta. White rice. Cornbread. Bagels, pastries, and croissants. Crackers that contain trans fat. Vegetables White potatoes. Corn. Creamed or fried vegetables. Vegetables in a cheese sauce. Fruits Dried fruits. Canned fruit in light or heavy syrup. Fruit juice. Meat and Other Protein Products Fatty cuts of meat. Ribs, chicken wings, bacon, sausage, bologna, salami, chitterlings, fatback, hot dogs, bratwurst, and packaged luncheon meats. Liver and organ meats. Dairy Whole or 2% milk, cream, half-and-half, and cream cheese. Whole milk cheeses. Whole-fat or sweetened yogurt. Full-fat cheeses. Nondairy creamers and whipped toppings. Processed cheese, cheese spreads, or cheese curds. Sweets and Desserts Corn syrup, sugars, honey, and molasses. Candy. Jam and jelly. Syrup. Sweetened cereals. Cookies, pies, cakes, donuts, muffins, and ice cream. Fats and Oils Butter, stick margarine, lard, shortening, ghee, or bacon fat. Coconut, palm kernel, or palm oils. Beverages Alcohol. Sweetened drinks (such as sodas, lemonade, and fruit drinks or punches). The items listed above may not be a complete list of foods and beverages to avoid. Contact your dietitian for more information.   This information is not intended to replace advice given to you by your health care provider. Make sure you discuss any  questions you have with your health care provider.   Document Released: 02/17/2005 Document Revised: 03/10/2014 Document Reviewed: 05/18/2013 Elsevier Interactive Patient Education [employer].

## 2015-04-27 NOTE — Progress Notes (Signed)
   Subjective:    Patient ID: Martin Murphy, male    DOB: 09-17-1965, 50 y.o.   MRN: [id number]  HPI Chief Complaint  Patient presents with  . follow-up    fasting cholesterol. sinus issues   He is here for follow up on elevated LDL and cholesterol.  Also has an acute complaint of sinus pressure, states this did not clear up completely after antibiotic. He reports only feeling about 75% better.  Does not smoke. Denies underlying seasonal allergies.  Burning in feet due to peripheral neuropathy. He is being treated by Dr. Ace Gins for this. Has been seen by podiatrist in past.  Also has vitamin D deficiency and this is being treated by Dr. Ace Gins.  He reports feeling frustrated, stressed and slightly depressed due to his chronic medical conditions that are keeping him from working. He states he is also depressed because he is impotent and is wanting to get re- married, however, he feels that this will keep him from doing so. He states he has discussed this with his urologist, Dr. Risa Grill in past, has taken cialis without success. He states he knows he has low testosterone, however, he is unable to take testosterone due to his history of prostate cancer. He would like to see a counselor about his frustration and stress.   Review of Systems Pertinent positives and negatives in the history of present illness.     Objective:   Physical Exam BP 120/80 mmHg  Pulse 64  Temp(Src) 98.2 F (36.8 C) (Oral)  Wt 207 lb (93.895 kg) Alert and in no distress. Mild maxillary sinus tenderness, right worse than left.  Tympanic membranes and canals are normal. Pharyngeal area is normal. Neck is supple without adenopathy or thyromegaly. Cardiac exam shows a regular sinus rhythm without murmurs or gallops. Lungs are clear to auscultation.      Assessment & Plan:  Hyperlipidemia - Plan: Lipid panel  Hypogonadism in male  Situational stress - Plan: Ambulatory referral to Psychology  Acute recurrent  pansinusitis - Plan: amoxicillin-clavulanate (AUGMENTIN) 875-125 MG tablet  Discussed that evidence is related to a blood flow problem. Viagra samples given #2 tabs, with instructions to take half pill 25 mg and see if this works. Discussed side effects such as low blood pressure, dizziness, priapism. Also discussed that there are many ways to be intimate. Referral made and he will call [facility] to schedule a counseling appointment for stress, frustration. He denies SI or HI.  He is scheduled to follow-up with urologist in the next month.  Will follow up on lipids. Discussed that since his sinusitis did not clear up totally we will pulse him with another round of antibiotics. He will let me know if he is not 100% better after completing the antibiotic. Spent a minimum of 25 minutes face-to-face with patient and at least 50% of that was in counseling and coordination of care.

## 2015-05-22 ENCOUNTER — Ambulatory Visit: Payer: 59 | Admitting: Licensed Clinical Social Worker

## 2015-05-25 ENCOUNTER — Encounter: Payer: Self-pay | Admitting: Family Medicine

## 2015-05-25 ENCOUNTER — Ambulatory Visit (INDEPENDENT_AMBULATORY_CARE_PROVIDER_SITE_OTHER): Payer: 59 | Admitting: Family Medicine

## 2015-05-25 VITALS — BP 120/78 | HR 60 | Wt 207.0 lb

## 2015-05-25 DIAGNOSIS — J0121 Acute recurrent ethmoidal sinusitis: Secondary | ICD-10-CM

## 2015-05-25 DIAGNOSIS — I10 Essential (primary) hypertension: Secondary | ICD-10-CM

## 2015-05-25 DIAGNOSIS — F439 Reaction to severe stress, unspecified: Secondary | ICD-10-CM

## 2015-05-25 DIAGNOSIS — Z658 Other specified problems related to psychosocial circumstances: Secondary | ICD-10-CM

## 2015-05-25 MED ORDER — HYDROCHLOROTHIAZIDE 25 MG PO TABS: 25.0000 mg | ORAL_TABLET | Freq: Every day | ORAL | Status: DC

## 2015-05-25 MED ORDER — AMOXICILLIN-POT CLAVULANATE 875-125 MG PO TABS: 1.0000 | ORAL_TABLET | Freq: Two times a day (BID) | ORAL | Status: DC

## 2015-05-25 NOTE — Patient Instructions (Signed)
Try using a humidifier and saline nasal spray twice daily to keep your sinuses moist. If you are not 100% better after completing the antibiotic call me.

## 2015-05-25 NOTE — Progress Notes (Signed)
Subjective:    Patient ID: Martin Murphy, male    DOB: October 30, 1965, 50 y.o.   MRN: [id number]  HPI Chief Complaint  Patient presents with  . follow-up    follow-up on counseling. has an appt Wednesday with Dr. Lenard Forth hill but not sure if he wants to go  . still stopped up    both antibiotics did not help and hes still sick   He is here for follow up on situational stress. States at our last visit he was "going through a lot of stuff", mainly due to feeling stressed and frustrated that he is unable to work or do things he enjoyed because of pain. States he has been doing much better. He has contacted a Social worker through his work E[employer] and states this is cheaper for him. He cancelled his appointment with [facility] for now. Denies SI or HI.  He states he is trying to find things to do that gives him joy. He walks with his neighbor, goes to visit his mom or goes to [employer] and shops and he feels better. He is no longer "laying around on the couch" like he was. He has a girlfriend.   Reports he continues to have nasal congestion and yellowish nasal drainage from bilateral nares. He states he got about 60% better with 10 days of Augmentin but did not call to let me know. He was treated in January with Amoxicillin and this did not help with symptoms at all. He does not smoke. Denies underlying allergies. Denies fever, chills, sore throat, ear pain, cough, DOE, chest pain, LE edema.   States he has a few more HCTZ tabs left and needs a refill. He states he is taking his medication daily.   Depression screen PHQ 2/9 05/25/2015  Decreased Interest 1  Down, Depressed, Hopeless 0  PHQ - 2 Score 1     Review of Systems Pertinent positives and negatives in the history of present illness.     Objective:   Physical Exam BP 120/78 mmHg  Pulse 60  Wt 207 lb (93.895 kg)  Alert and in no distress. Mild ethmoid sinus tenderness, otherwise nontender. Tympanic membranes and canals are  normal. Pharyngeal area is normal. Neck is supple without adenopathy or thyromegaly. Cardiac exam shows a regular sinus rhythm without murmurs or gallops. Lungs are clear to auscultation.      Assessment & Plan:  Situational stress  Acute recurrent ethmoidal sinusitis - Plan: amoxicillin-clavulanate (AUGMENTIN) 875-125 MG tablet  Essential hypertension  Discussed that he appears to be managing things better and that I strongly recommend that he go to the counseling appointment through his EAP at work. He will let me know if his symptoms worsen. Congratulated him on being more active and encouraged him to continue dong things he enjoys.  Discussed that since he is not 100% better since his last antibiotic and appears to still have sinusitis, I will refill augmentin for 2 weeks and if he is not back to normal, he will call. We will need to consider CT scan of sinuses or possible referral to ENT. Recommend also taking decongestant for next few days and using saline nasal spray. Sample of saline nasal spray given. Also suggested trying a humidifier in his bedroom since he has awakened with dry mucous membranes. He denies underlying allergies so this does not seem to be contributing.  Refilled HCTZ- he appears to be doing well on this.  Follow-up in 3 months for med check

## 2015-06-20 ENCOUNTER — Other Ambulatory Visit (HOSPITAL_BASED_OUTPATIENT_CLINIC_OR_DEPARTMENT_OTHER): Payer: 59

## 2015-06-20 DIAGNOSIS — D472 Monoclonal gammopathy: Secondary | ICD-10-CM

## 2015-06-20 DIAGNOSIS — R768 Other specified abnormal immunological findings in serum: Secondary | ICD-10-CM

## 2015-06-20 LAB — CBC WITH DIFFERENTIAL/PLATELET
BASO%: 0.2 % (ref 0.0–2.0)
Basophils Absolute: 0 10*3/uL (ref 0.0–0.1)
EOS%: 2.4 % (ref 0.0–7.0)
Eosinophils Absolute: 0.1 10*3/uL (ref 0.0–0.5)
HCT: 40.8 % (ref 38.4–49.9)
HEMOGLOBIN: 13.6 g/dL (ref 13.0–17.1)
LYMPH%: 33.8 % (ref 14.0–49.0)
MCH: 28.2 pg (ref 27.2–33.4)
MCHC: 33.3 g/dL (ref 32.0–36.0)
MCV: 84.6 fL (ref 79.3–98.0)
MONO#: 0.3 10*3/uL (ref 0.1–0.9)
MONO%: 6.5 % (ref 0.0–14.0)
NEUT%: 57.1 % (ref 39.0–75.0)
NEUTROS ABS: 2.6 10*3/uL (ref 1.5–6.5)
Platelets: 199 10*3/uL (ref 140–400)
RBC: 4.82 10*6/uL (ref 4.20–5.82)
RDW: 13.7 % (ref 11.0–14.6)
WBC: 4.6 10*3/uL (ref 4.0–10.3)
lymph#: 1.6 10*3/uL (ref 0.9–3.3)

## 2015-06-20 LAB — COMPREHENSIVE METABOLIC PANEL
ALBUMIN: 3.6 g/dL (ref 3.5–5.0)
ALK PHOS: 210 U/L — AB (ref 40–150)
ALT: 37 U/L (ref 0–55)
AST: 35 U/L — AB (ref 5–34)
Anion Gap: 11 mEq/L (ref 3–11)
BUN: 10.4 mg/dL (ref 7.0–26.0)
CO2: 26 mEq/L (ref 22–29)
Calcium: 9.8 mg/dL (ref 8.4–10.4)
Chloride: 101 mEq/L (ref 98–109)
Creatinine: 1 mg/dL (ref 0.7–1.3)
EGFR: >90 mL/min/{1.73_m2} (ref >90)
GLUCOSE: 84 mg/dL (ref 70–140)
POTASSIUM: 4.2 meq/L (ref 3.5–5.1)
SODIUM: 138 meq/L (ref 136–145)
TOTAL PROTEIN: 8.4 g/dL — AB (ref 6.4–8.3)
Total Bilirubin: 0.55 mg/dL (ref 0.20–1.20)

## 2015-06-20 LAB — LACTATE DEHYDROGENASE: LDH: 128 U/L (ref 125–245)

## 2015-06-21 LAB — BETA 2 MICROGLOBULIN, SERUM: BETA 2: 2.2 mg/L (ref 0.6–2.4)

## 2015-06-21 LAB — IGG, IGA, IGM
IgA, Qn, Serum: 697 mg/dL — ABNORMAL HIGH (ref 90–386)
IgG, Qn, Serum: 1258 mg/dL (ref 700–1600)
IgM, Qn, Serum: 36 mg/dL (ref 20–172)

## 2015-06-21 LAB — KAPPA/LAMBDA LIGHT CHAINS
IG KAPPA FREE LIGHT CHAIN: 25.27 mg/L — AB (ref 3.30–19.40)
Ig Lambda Free Light Chain: 21.18 mg/L (ref 5.71–26.30)
Kappa/Lambda FluidC Ratio: 1.19 (ref 0.26–1.65)

## 2015-06-27 ENCOUNTER — Encounter: Payer: Self-pay | Admitting: Internal Medicine

## 2015-06-27 ENCOUNTER — Ambulatory Visit (HOSPITAL_BASED_OUTPATIENT_CLINIC_OR_DEPARTMENT_OTHER): Payer: 59 | Admitting: Internal Medicine

## 2015-06-27 ENCOUNTER — Telehealth: Payer: Self-pay | Admitting: Internal Medicine

## 2015-06-27 VITALS — BP 136/87 | HR 76 | Temp 98.7°F | Resp 18 | Ht 71.0 in | Wt 206.3 lb

## 2015-06-27 DIAGNOSIS — D472 Monoclonal gammopathy: Secondary | ICD-10-CM

## 2015-06-27 NOTE — Progress Notes (Signed)
[facility] [phone]   [phone]  OFFICE PROGRESS NOTE  Harland Dingwall, NP [place] [address]  DIAGNOSIS: Monoclonal gammopathy of undetermined significance  PRIOR THERAPY: None  CURRENT THERAPY: Observation.   INTERVAL HISTORY: Martin Murphy 50 y.o. male returns to the clinic today for annual followup visit. The patient is feeling fine today with no specific complaints except for neuropathy radiating from the back to the right leg. It is scheduled to see a neurologist tomorrow. He denied having any significant chest pain, shortness of breath, cough or hemoptysis. The patient denied having any nausea or vomiting. He has no significant weight loss or night sweats. He has several studies performed recently including myeloma panel and he is here for evaluation and discussion of his lab results.   MEDICAL HISTORY: Past Medical History  Diagnosis Date  . Hypertension   . 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 Prostate cancer ([facility]) DX  12/20/2012    biopsies x 2, Gleason 3+3=6  vol 36  . Bunion     bilateral great toe  . Nocturia   . At risk for sleep apnea     STOP-BANG=5    SENT TO PCP 10-13-2013  . Hereditary and idiopathic peripheral neuropathy 09/20/2014    ALLERGIES:  has No Known Allergies.  MEDICATIONS:  Current Outpatient Prescriptions  Medication Sig Dispense Refill  . Ascorbic Acid (VITAMIN C PO) Take by mouth daily. Reported on 05/25/2015    . hydrochlorothiazide (HYDRODIURIL) 25 MG tablet Take 1 tablet (25 mg total) by mouth daily. 30 tablet 3  . MAGNESIUM PO Take by mouth daily. Reported on 05/25/2015    . sildenafil (VIAGRA) 50 MG tablet Take 1 tablet (50 mg total) by mouth daily as needed for erectile dysfunction. (Patient not taking: Reported on 05/25/2015) 2 tablet 0  . tamsulosin (FLOMAX) 0.4 MG CAPS capsule Take 0.4 mg by mouth. Reported on 05/25/2015    . vitamin B-12 (CYANOCOBALAMIN)  1000 MCG tablet Take 1,000 mcg by mouth daily. Reported on 05/25/2015    . Vitamin D, Ergocalciferol, (DRISDOL) 50000 UNITS CAPS capsule Reported on 05/25/2015  2   No current facility-administered medications for this visit.    SURGICAL HISTORY:  Past Surgical History  Procedure Laterality Date  . Prostate biopsy  12/20/12    gleason 6  . Prostate biopsy  06/07/13    gleason 6, vol 36 gms  . Inguinal hernia repair Bilateral right  02-09-2012/   left  1990's  . Bronchoscopy  1990's    w/  biopsy's  . Radioactive seed implant N/A 10/20/2013    Procedure: RADIOACTIVE SEED IMPLANT;  Surgeon: Bernestine Amass, MD;  Location: [facility];  Service: Urology;  Laterality: N/A;  . Bunionectomy    . Foot surgery      3 surgeries total    REVIEW OF SYSTEMS:  A comprehensive review of systems was negative except for: Neurological: positive for paresthesia   PHYSICAL EXAMINATION: General appearance: alert, cooperative and no distress Head: Normocephalic, without obvious abnormality, atraumatic Neck: no adenopathy, no JVD, supple, symmetrical, trachea midline and thyroid not enlarged, symmetric, no tenderness/mass/nodules Lymph nodes: Cervical, supraclavicular, and axillary nodes normal. Resp: clear to auscultation bilaterally Back: symmetric, no curvature. ROM normal. No CVA tenderness. Cardio: regular rate and rhythm, S1, S2 normal, no murmur, click, rub or gallop GI: soft, non-tender; bowel sounds normal; no masses,  no organomegaly Extremities: extremities normal, atraumatic, no cyanosis or edema  ECOG PERFORMANCE STATUS: 0 - Asymptomatic  Blood pressure 136/87, pulse 76, temperature 98.7 F (37.1 C), temperature source Oral, resp. rate 18, height 5\' 11"  (1.803 m), weight 206 lb 4.8 oz (93.577 kg), SpO2 98 %.  LABORATORY DATA: Lab Results  Component Value Date   WBC 4.6 06/20/2015   HGB 13.6 06/20/2015   HCT 40.8 06/20/2015   MCV 84.6 06/20/2015   PLT 199 06/20/2015       Chemistry      Component Value Date/Time   NA 138 06/20/2015 1414   NA 141 10/13/2013 1309   K 4.2 06/20/2015 1414   K 4.4 10/13/2013 1309   CL 102 10/13/2013 1309   CO2 26 06/20/2015 1414   CO2 26 10/13/2013 1309   BUN 10.4 06/20/2015 1414   BUN 16 10/13/2013 1309   CREATININE 1.0 06/20/2015 1414   CREATININE 1.13 10/13/2013 1309      Component Value Date/Time   CALCIUM 9.8 06/20/2015 1414   CALCIUM 10.0 10/13/2013 1309   ALKPHOS 210* 06/20/2015 1414   ALKPHOS 213* 10/13/2013 1309   AST 35* 06/20/2015 1414   AST 46* 10/13/2013 1309   ALT 37 06/20/2015 1414   ALT 56* 10/13/2013 1309   BILITOT 0.55 06/20/2015 1414   BILITOT 0.3 10/13/2013 1309     Other lab results: Beta-2 microglobulin 2.2, IgG 1258, IgA 697 and IgM 36. Kappa light chain 25.27, free lambda light chain  21.18 with a kappa/lambda ratio of 1.19  RADIOGRAPHIC STUDIES: No results found.  ASSESSMENT AND PLAN: This is a very pleasant 50 years old African American male with minor elevation of IgA most likely inflammatory in origin but I cannot rule out monoclonal gammopathy of undetermined significance. His myeloma panel showed no evidence for disease progression. I discussed the lab result with the patient today. He will continue on observation for now. I will see him back with repeat myeloma panel in one year. I will call him with the result and recommendation if there is any significant issues. He was advised to call immediately if he has any concerning symptoms in the interval. The patient voices understanding of current disease status and treatment options and is in agreement with the current care plan.  All questions were answered. The patient knows to call the clinic with any problems, questions or concerns. We can certainly see the patient much sooner if necessary.  Disclaimer: This note was dictated with voice recognition software. Similar sounding words can inadvertently be transcribed and may not  be corrected upon review.

## 2015-06-27 NOTE — Telephone Encounter (Signed)
Gave and printed appt sched and avs for pt for April 2018 °

## 2015-07-02 ENCOUNTER — Telehealth: Payer: Self-pay | Admitting: Family Medicine

## 2015-07-02 ENCOUNTER — Encounter: Payer: Self-pay | Admitting: Family Medicine

## 2015-07-02 ENCOUNTER — Ambulatory Visit (INDEPENDENT_AMBULATORY_CARE_PROVIDER_SITE_OTHER): Payer: 59 | Admitting: Family Medicine

## 2015-07-02 VITALS — BP 120/80 | HR 64 | Wt 209.4 lb

## 2015-07-02 DIAGNOSIS — G8929 Other chronic pain: Secondary | ICD-10-CM | POA: Insufficient documentation

## 2015-07-02 DIAGNOSIS — M545 Low back pain, unspecified: Secondary | ICD-10-CM | POA: Insufficient documentation

## 2015-07-02 DIAGNOSIS — M79673 Pain in unspecified foot: Secondary | ICD-10-CM

## 2015-07-02 NOTE — Telephone Encounter (Signed)
Received the records request that we sent over to [facility]. Per fax they do not have a patient by the the name Martin Murphy.

## 2015-07-02 NOTE — Progress Notes (Signed)
Subjective:    Patient ID: Martin Murphy, male    DOB: Jul 21, 1965, 50 y.o.   MRN: [id number]  HPI Chief Complaint  Patient presents with  . disucss meds    discuss med cymbalta and metarx pain managment   He is here with complaints of recently being switched from his previous chronic pain specialist to a different one and would like help with chronic pain management or finding a new specialist.  States he has been seeing Dr. Ace Murphy for pain management, and he cannot see him anymore due to insurance issues. States Dr. Ace Murphy referred him to two different pain management specialists, Dr. Primus Murphy at [facility] and [facility] pain [facility]. Dr[facility] is at [facility]. He states he saw Dr. Primus Murphy last week and was told that "he does not want to see patients with disabilities and does not fill out paperwork for that". States Dr. Primus Murphy recommends that he have his PCP prescribe Cymbalta and Metanx (unsure of spelling). States Dr. Primus Murphy also stated he was referring him to a pain specialist at [facility] but is not sure if this has been done yet.  He was also told he should try [facility] and states he has not heard back and he sent in documentation 4 weeks ago.    Works at [employer]. Works as Conservator, museum/gallery. He states his job requires him to be physically able to bend and lift up to 50lbs.  States he was diagnosed with chronic back pain in April 2016, by Dr. Ace Murphy, had injection and MRI. States she was keeping him out of work.  States he could not tolerate Lyrica.  He saw Dr. Ace Murphy until this past March.  Reports he had nerve conduction tests done in August 2016 with [facility]. States he has peripheral neuropathy. Has been followed by podiatrist Dr. Jacqualyn Murphy for chronic foot pain as well.   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   Currently  taking vitamins only for medication. Has been using this regimen for several months. Denies any new pain, pain is unchanged.  States he must prepare himself to go back to work but is also applying for permanent disability.  He also reports being under a great deal of stress and is seeing a counselor but does not think this is really helping him. Would like to switch. He states he does not need a referral,  he knows of someone and will switch through the EAP at his employer.   Denies fever, chills, unexplained weight loss, worsening fatigue, chest pain, DOE, orthopnea, cough, GI or GU issues.    Review of Systems Pertinent positives and negatives in the history of present illness.     Objective:   Physical Exam BP 120/80 mmHg  Pulse 64  Wt 209 lb 6.4 oz (94.983 kg)  Alert and oriented and in no acute distress. Not otherwise examined.       Assessment & Plan:  Pain, foot, chronic, unspecified laterality  Chronic lumbar pain  Sabrina, CMA, called [facility] and they confirmed they do have his documentation and he is being considered for approval. Should hear from them in next 2 weeks. Made patient aware of this. Encouraged him to fill out medical release so that I may get records from his visit with Dr. Primus Murphy and possibly consult with him regarding his recommendation. Encouraged patient to  call and check on referral to pain management at [facility] where Dr. Primus Murphy states he was also referring patient.  In the meantime, I discussed with patient that I also recommend contacting Dr. Ace Murphy since she took him out of work and diagnosed him with his chronic conditions in order to obtain recommendation for work participation until we can get him established with a different pain specialist. Since his pain is unchanged I am not prescribing any new medications today. Patient verbalized understanding of this and appears ok with the plan of care.  Recommend that he schedule with a  different therapist since he feels as though he is not benefiting from current situation and would like to change therapists.  Will follow up once I get records from Dr. Primus Murphy.

## 2015-07-03 NOTE — Telephone Encounter (Signed)
Left message for pt to call me back 

## 2015-07-03 NOTE — Telephone Encounter (Signed)
Spoke again with patient and he states that Dr. Primus Bravo office hasn't finished the dictation but would fax it over to [place] when they finish it. He said we obviously faxed over to another office and not Dr. Primus Bravo

## 2015-07-03 NOTE — Telephone Encounter (Signed)
Please call the patient and let him know that Dr. Ethel Rana office denied having him as a patient and no records were available. Can he check on this? Also, did he contact Dr. Unice Bailey office or [facility] as we discussed?

## 2015-07-03 NOTE — Telephone Encounter (Signed)
Pt was going to call Dr. Primus Bravo office as he spoke with them yesterday and he was going to try to get records from Dr. Ace Gins

## 2015-07-04 NOTE — Telephone Encounter (Signed)
Recv'd records from Dr. Mohammed Kindle & put in your folder

## 2015-07-16 ENCOUNTER — Encounter: Payer: 59 | Attending: Physical Medicine & Rehabilitation | Admitting: Physical Medicine & Rehabilitation

## 2015-07-16 ENCOUNTER — Encounter: Payer: Self-pay | Admitting: Physical Medicine & Rehabilitation

## 2015-07-16 ENCOUNTER — Telehealth: Payer: Self-pay | Admitting: Internal Medicine

## 2015-07-16 VITALS — BP 121/76 | HR 114 | Resp 16

## 2015-07-16 DIAGNOSIS — D472 Monoclonal gammopathy: Secondary | ICD-10-CM | POA: Diagnosis not present

## 2015-07-16 DIAGNOSIS — R351 Nocturia: Secondary | ICD-10-CM | POA: Diagnosis not present

## 2015-07-16 DIAGNOSIS — G609 Hereditary and idiopathic neuropathy, unspecified: Secondary | ICD-10-CM | POA: Diagnosis not present

## 2015-07-16 DIAGNOSIS — Z79899 Other long term (current) drug therapy: Secondary | ICD-10-CM | POA: Insufficient documentation

## 2015-07-16 DIAGNOSIS — M5136 Other intervertebral disc degeneration, lumbar region: Secondary | ICD-10-CM | POA: Diagnosis not present

## 2015-07-16 DIAGNOSIS — M519 Unspecified thoracic, thoracolumbar and lumbosacral intervertebral disc disorder: Secondary | ICD-10-CM | POA: Diagnosis not present

## 2015-07-16 DIAGNOSIS — C61 Malignant neoplasm of prostate: Secondary | ICD-10-CM | POA: Diagnosis not present

## 2015-07-16 DIAGNOSIS — D860 Sarcoidosis of lung: Secondary | ICD-10-CM | POA: Insufficient documentation

## 2015-07-16 DIAGNOSIS — I10 Essential (primary) hypertension: Secondary | ICD-10-CM | POA: Diagnosis not present

## 2015-07-16 IMAGING — US US ABDOMEN COMPLETE
1 series · 14 of 25 positions shown · non-contrast
Comparison: No priors.

CLINICAL DATA: 48-year-old male with elevated liver function tests.
History of prostate cancer and monoclonal gammopathy of uncertain
significance (MGUS).

EXAM:
ULTRASOUND ABDOMEN COMPLETE

[Series 1: us abdomen complete · 0.27mm/px · 14 of 86 slices shown]
[im 1/86]
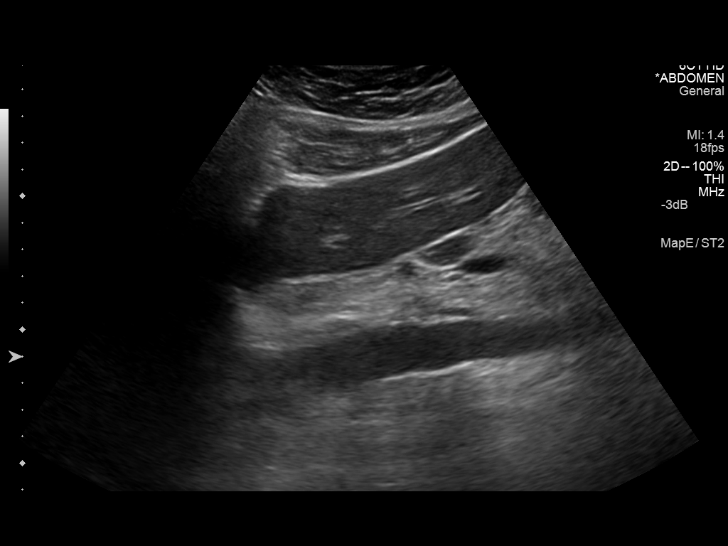
[im 8/86]
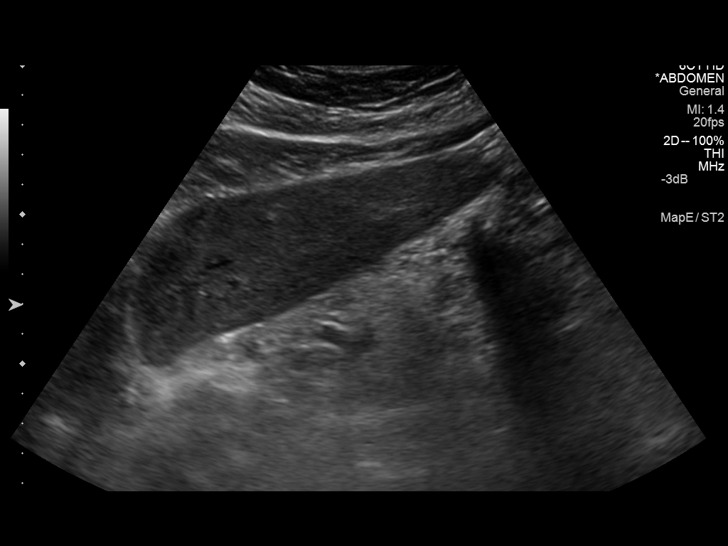
[im 15/86]
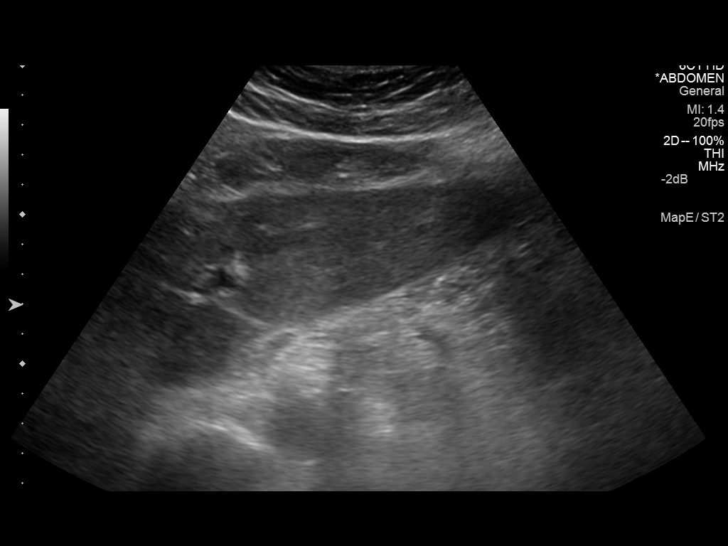
[im 22/86]
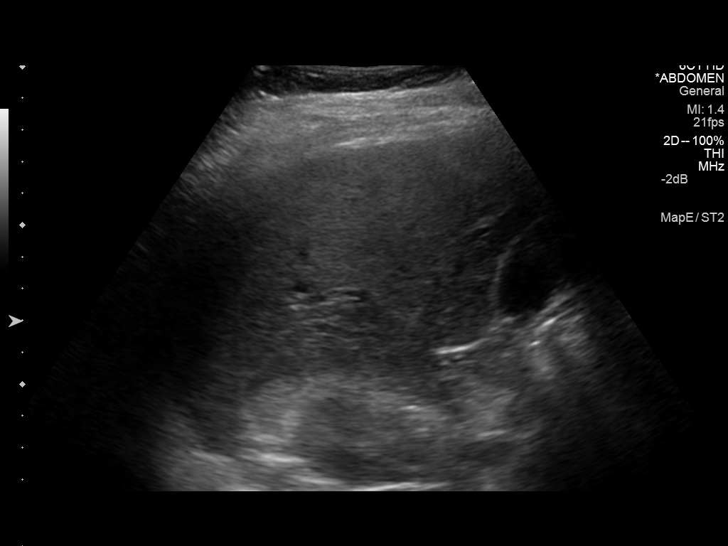
[im 29/86]
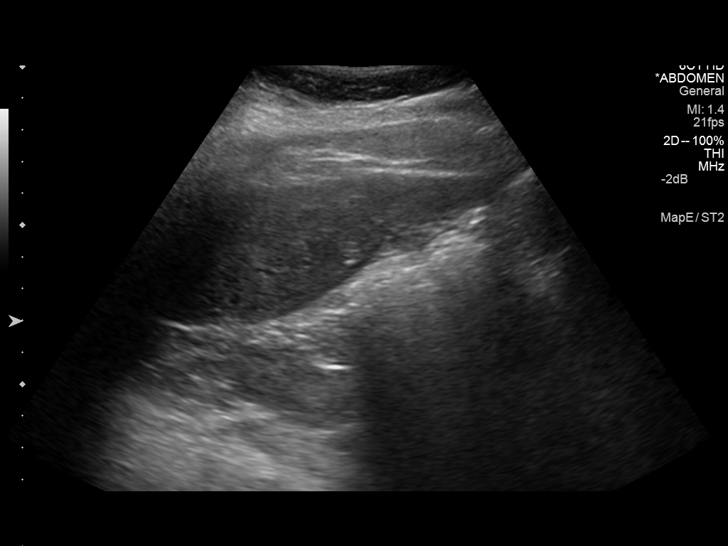
[im 32/86]
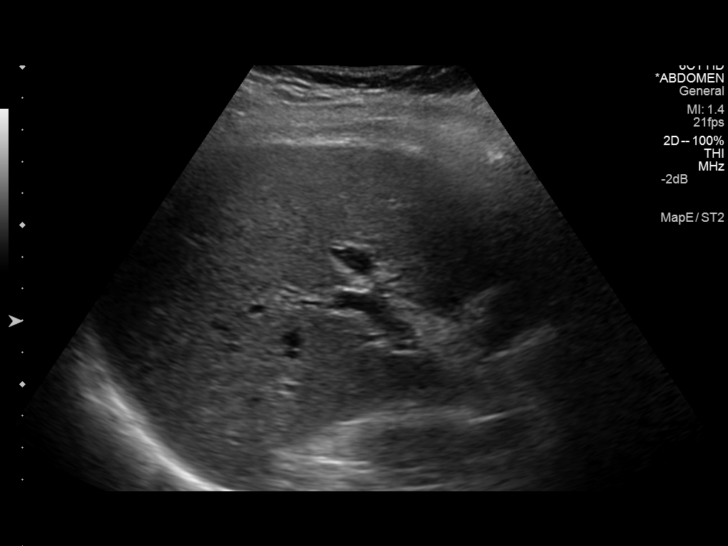
[im 39/86]
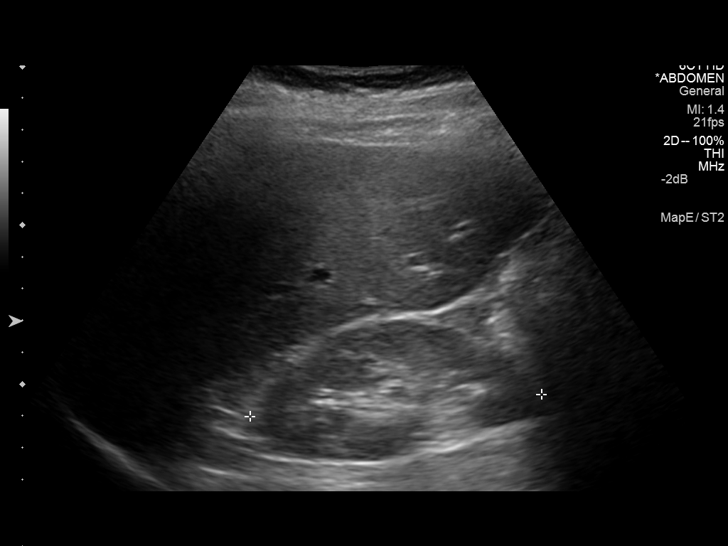
[im 47/86]
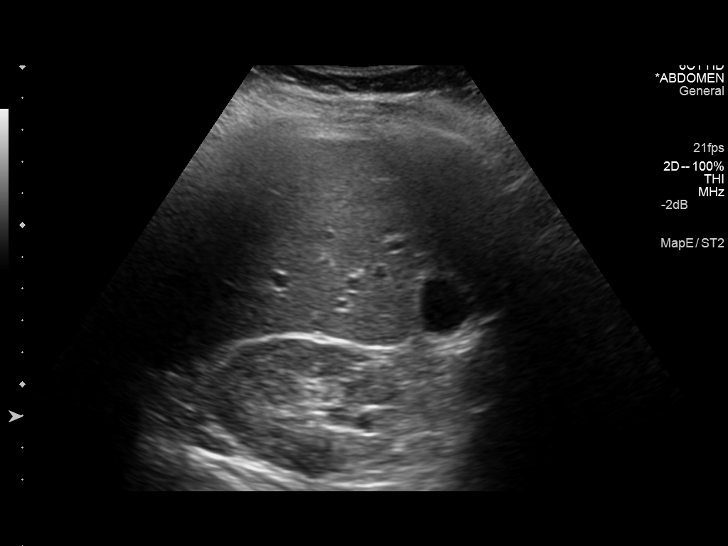
[im 54/86]
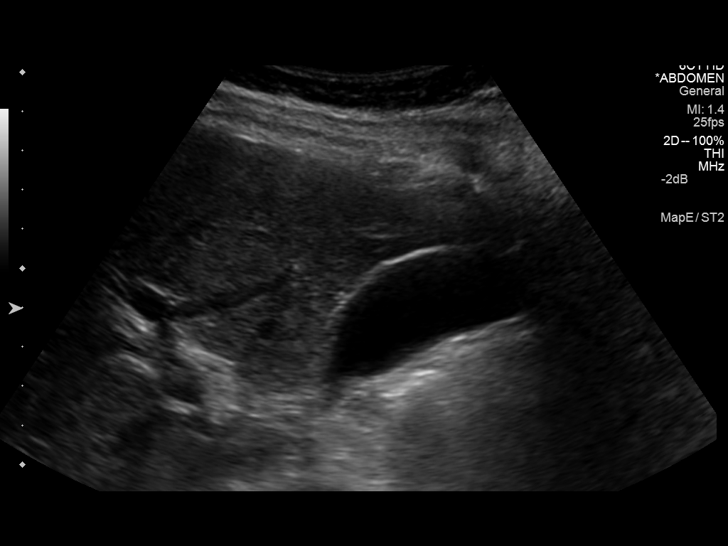
[im 57/86]
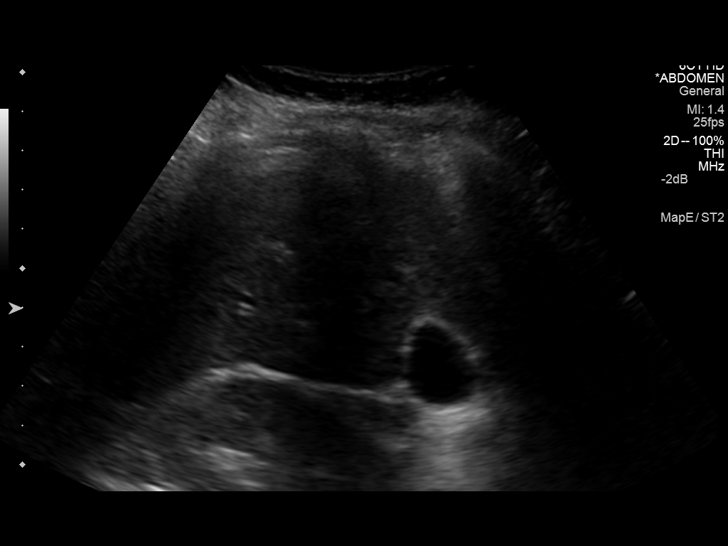
[im 64/86]
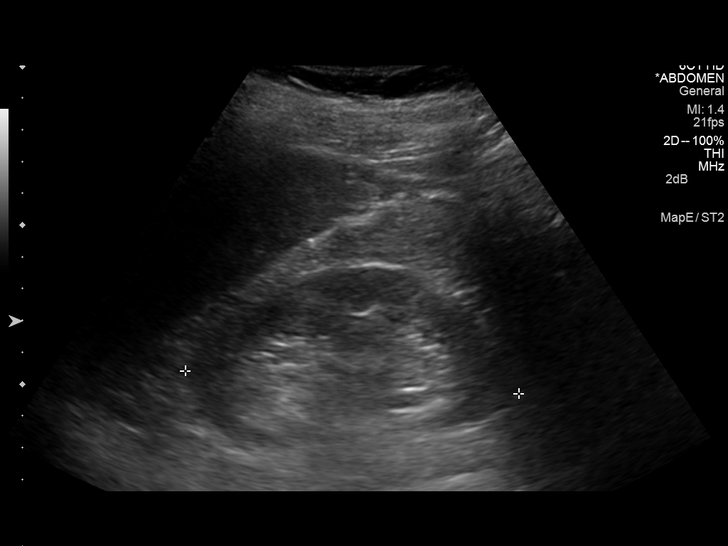
[im 71/86]
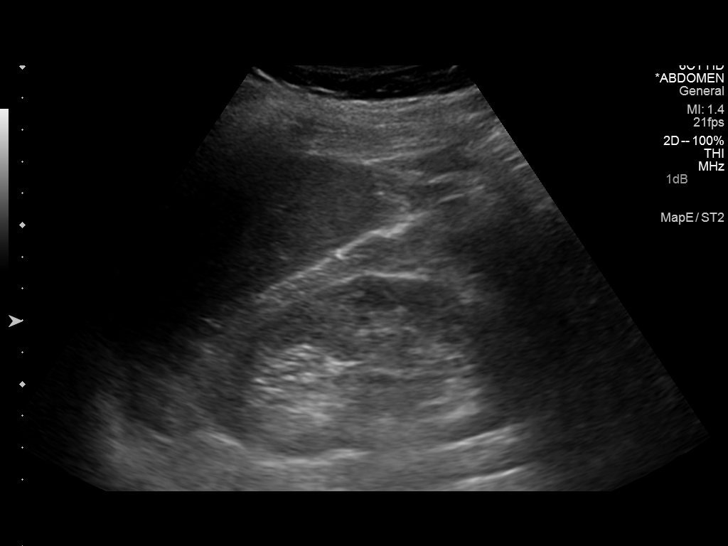
[im 78/86]
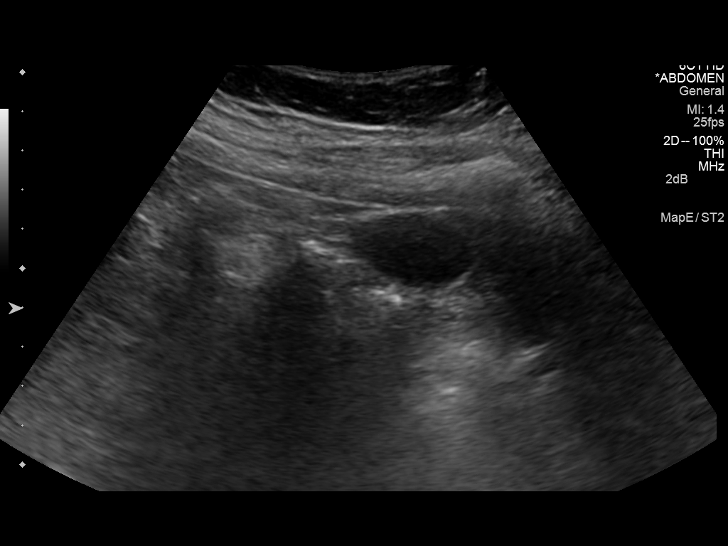
[im 86/86]
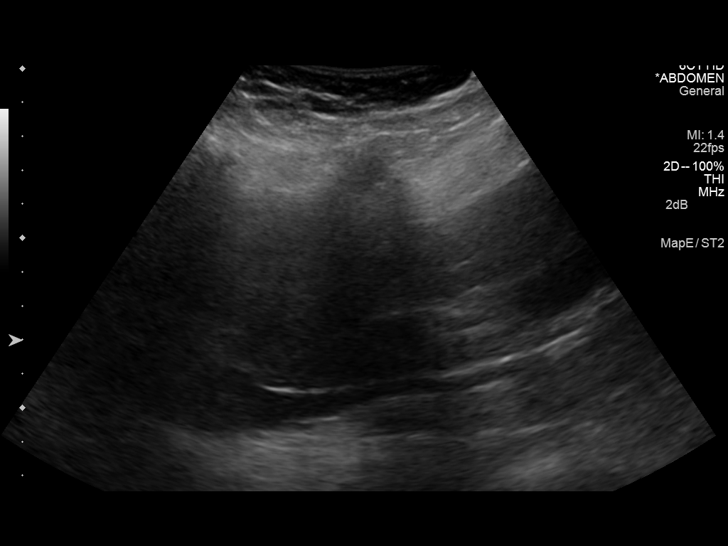

[14 of 25 positions shown; findings below may reference images not displayed]

FINDINGS: Gallbladder: No gallstones or wall thickening visualized. No
sonographic Murphy sign noted.

Common bile duct: Diameter: 1.5 mm

Liver: No focal lesion identified. Within normal limits in
parenchymal echogenicity.

IVC: No abnormality visualized.

Pancreas: Visualized portion unremarkable.

Spleen: Size and appearance within normal limits.  8.6 cm in length.

Right Kidney: Length: 9.2 cm. Echogenicity within normal limits. In
the upper pole of the right kidney there is a 1.1 x 1.0 x 1.1 cm
hypoechoic lesion with increased through transmission, favored to
represent a small cyst. No hydronephrosis visualized.

Left Kidney: Length: 10.5 cm. Echogenicity within normal limits. No
mass or hydronephrosis visualized.

Abdominal aorta: No aneurysm visualized.

Other findings: None.
IMPRESSION: 1. No acute findings.
2. Small cyst in the upper pole of the right kidney incidentally
noted.

## 2015-07-16 MED ORDER — HYDROCHLOROTHIAZIDE 25 MG PO TABS: 25.0000 mg | ORAL_TABLET | Freq: Every day | ORAL | Status: DC

## 2015-07-16 MED ORDER — NORTRIPTYLINE HCL 10 MG PO CAPS: 10.0000 mg | ORAL_CAPSULE | Freq: Every day | ORAL | Status: DC

## 2015-07-16 MED ORDER — LIDOCAINE 5 % EX OINT: 1.0000 "application " | TOPICAL_OINTMENT | CUTANEOUS | Status: DC | PRN

## 2015-07-16 NOTE — Addendum Note (Signed)
Addended by: Alger Simons T on: 07/16/2015 02:47 PM   Modules accepted: Level of Service

## 2015-07-16 NOTE — Patient Instructions (Addendum)
PLEASE CALL ME WITH ANY PROBLEMS OR QUESTIONS [id number]).     CHECK ON OTHER MEDS. ?NEURONTIN, ETC

## 2015-07-16 NOTE — Progress Notes (Signed)
Subjective:    Patient ID: Martin Murphy, male    DOB: 14-Sep-1965, 50 y.o.   MRN: [id number]  HPI   This is an initial evaluation for [name] who has complaints of chronic low back pain and lower extremity pain. He has had low back pain for at least 5 years. He has been seeing Dr. Brunilda Payor found two different MRI's (the most recent of which was from 08/2014) which reveal mild lumbar disc disease and mild foraminal encroachment at L5-S1 on the right.  He has had an ESI without any beneft. He did find therapy quite helpful for his back. His back pain is inconsistent. He has difficulty noticing a pattern. It may worsen if he is lying for a long period of time or when he first gets up in the morning. The pain is generally axial--it does not radiate to the hips, groins, or legs. Stretching can help (especially that which he learned from PT). He stretches it daily. For the most part his back is tolerable compared to his feet and leg.   His leg pain radiates from his upper calves to his feet.  It's worst when he walks long distances. he has been able to walk up to a mile.The pain is burning and tingling. It started about 2 years ago after he had seed implants to treat prostate cancer. He has tried lyrica which caused drowsiness. He has also tried gabapentin but he doesn't remember if it helped or caused side effects. He hasn't used any topical creams and ointments. He is not interested in using any narcotics. Currently he is using cymbalta, and numerous supplements OTC. He tries to wear reasonable shoes when he's up and about. He did have two bunions removed by podiatry, Dr. Jacqualyn Murphy. I found a NCS/EMG study from 09/2014 by Dr. Jannifer Murphy which was notable for a mild axonal distal polyneuropathy  Additionally before coming here he saw Dr. Primus Bravo, who essentially said he didn't have anything to offer.  The patient is on disability currently through his employer. He has been out almost a year. 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 He states his job requires him to be physically able to bend and lift up to 50lbs. He is feeling the stress of needing to back to work or his job may be given away. He wants to return but is unsure if he can tolerate the work load given his pain levels.      Pain Inventory Average Pain 7 Pain Right Now 7 My pain is burning, stabbing, tingling and aching  In the last 24 hours, has pain interfered with the following? General activity 6 Relation with others 8 Enjoyment of life 9 What TIME of day is your pain at its worst? night Sleep (in general) Poor  Pain is worse with: walking, standing and some activites Pain improves with: heat/ice Relief from Meds: 3  Mobility walk without assistance walk with assistance use a cane ability to climb steps?  yes do you drive?  yes  Function disabled: date disabled . I need assistance with the following:  meal prep, household duties and shopping  Neuro/Psych weakness numbness tingling depression anxiety loss of taste or smell suicidal thoughts- no active plan   Prior Studies new visit  Physicians involved in your care Primary care Dr. Raenette Murphy, new visit   Family History  Problem Relation Age of Onset  . Hypertension Brother   . Cancer Maternal Uncle  prostate, 1 had surgery, 1 had seeds  . Cancer Maternal Grandfather     prostate  . Diabetes Brother   . Colon cancer Neg Hx   . Esophageal cancer Neg Hx   . Stomach cancer Neg Hx   . Rectal cancer Neg Hx    Social History   Social History  . Marital Status: Divorced    Spouse Name: N/A  . Number of Children: N/A  . Years of Education: N/A   Social History Main Topics  . Smoking status: Never Smoker   . Smokeless tobacco: Never Used  . Alcohol Use: No  . Drug Use: No  . Sexual Activity: Yes   Other Topics Concern  . None   Social History Narrative   Past Surgical History  Procedure  Laterality Date  . Prostate biopsy  12/20/12    gleason 6  . Prostate biopsy  06/07/13    gleason 6, vol 36 gms  . Inguinal hernia repair Bilateral right  02-09-2012/   left  1990's  . Bronchoscopy  1990's    w/  biopsy's  . Radioactive seed implant N/A 10/20/2013    Procedure: RADIOACTIVE SEED IMPLANT;  Surgeon: Martin Amass, MD;  Location: [facility];  Service: Urology;  Laterality: N/A;  . Bunionectomy    . Foot surgery      3 surgeries total   Past Medical History  Diagnosis Date  . Hypertension   . Sarcoidosis of lung ([place])     per lung bx 1990's  . Prostate cancer ([place]) DX  12/20/2012    biopsies x 2, Gleason 3+3=6  vol 36  . Bunion     bilateral great toe  . Nocturia   . At risk for sleep apnea     STOP-BANG=5    SENT TO PCP 10-13-2013  . Hereditary and idiopathic peripheral neuropathy 09/20/2014   BP 121/76 mmHg  Pulse 114  Resp 16  SpO2 96%  Opioid Risk Score:   Fall Risk Score:  `1  Depression screen PHQ 2/9  Depression screen [facility] 2/9 07/16/2015 05/25/2015 10/27/2014  Decreased Interest 3 1 0  Down, Depressed, Hopeless 2 0 0  PHQ - 2 Score 5 1 0  Altered sleeping 2 - -  Tired, decreased energy 2 - -  Change in appetite 1 - -  Feeling bad or failure about yourself  2 - -  Trouble concentrating 1 - -  Moving slowly or fidgety/restless 1 - -  Suicidal thoughts 1 - -  PHQ-9 Score 15 - -     Review of Systems     Objective:   Physical Exam   General: Alert and oriented x 3, No apparent distress HEENT: Head is normocephalic, atraumatic, PERRLA, EOMI, sclera anicteric, oral mucosa pink and moist, dentition intact, ext ear canals clear,  Neck: Supple without JVD or lymphadenopathy Heart: Reg rate and rhythm. No murmurs rubs or gallops Chest: CTA bilaterally without wheezes, rales, or rhonchi; no distress Abdomen: Soft, non-tender, non-distended, bowel sounds positive. Extremities: No clubbing, cyanosis, or edema. Pulses are 2+ Skin:  Clean and intact without signs of breakdown. Scars on feet from bunionectomies. Neuro: Pt is cognitively appropriate with normal insight, memory, and awareness. Cranial nerves 2-12 are intact. Sensory exam is normal in the UE's. He demonstrates decreased LT/pain sense/vibration below the mid calf---left seemed more affected than right.. Reflexes are 2+ in all 4's. Fine motor coordination is intact. No tremors. Motor function is grossly 5/5.  Musculoskeletal:  Full ROM, No pain with AROM or PROM in the neck, and upper/lower extremities. Posture appropriate. Lumbar tenderness with flexion although he was able to bend to 90+degrees. Extension/rotation/side bending/facet maneuvers/SLR did not provoke pain. There was no back tenderness with palpation and no lumbar muscle spasm was appreciated. i found no tenderness in the glutes, sacrum, or trochs. Arches are flat. Mild antalgia with weightbearing on either leg/foot. Psych: Pt's affect is appropriate. Pt is cooperative        Assessment & Plan:  1. 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 Degenerative lumbar disc disease 3. Prostate cancer s/p seed implants 4. MGUS    Plan: 1. Trial of nortriptyline 10-20mg  at night---titration schedule provided 2. Topical lidocaine for legs--rx written---apply BID to TID 3. Consider compounded cream as well 4. Continue cymbalta 5. I asked the patient to check and see if he can find records of any other medications he's tried for neuropathic pain. Consider trial of trileptal/topamax/tegretol/zonegran 6. Forty-five minutes of face to face patient care time were spent during this visit. All questions were encouraged and answered. Return in about 1 month (around 08/16/2015).   Meredith Staggers, MD, [facility] 07/16/2015

## 2015-07-16 NOTE — Telephone Encounter (Signed)
Refill request came through for #90 of hctz and not #30

## 2015-07-24 ENCOUNTER — Telehealth: Payer: Self-pay | Admitting: *Deleted

## 2015-07-24 NOTE — Telephone Encounter (Signed)
Santiago Glad, nurse case manager, [employer] called regarding pt, asking for 07/16/2015 clinic note and the status of a Work Status Update....Marland KitchenMarland KitchenI looked for the work status update and could not find it in our office.  I called the nurse case manager back and asked her to re-fax the form.  She agreed and asked [place] to expedite so the pt can get paid.  I faxed over the 07/16/2015 clinic note. Currently waiting for the Work Status Update form to come over the fax machine.

## 2015-07-25 NOTE — Telephone Encounter (Signed)
Work status update and clinic note faxed back to [employer]

## 2015-08-20 ENCOUNTER — Encounter: Payer: Self-pay | Admitting: Physical Medicine & Rehabilitation

## 2015-08-20 ENCOUNTER — Encounter: Payer: Self-pay | Admitting: Podiatry

## 2015-08-20 ENCOUNTER — Encounter: Payer: 59 | Attending: Physical Medicine & Rehabilitation | Admitting: Physical Medicine & Rehabilitation

## 2015-08-20 ENCOUNTER — Ambulatory Visit (INDEPENDENT_AMBULATORY_CARE_PROVIDER_SITE_OTHER): Payer: 59 | Admitting: Podiatry

## 2015-08-20 VITALS — BP 120/83 | HR 91 | Resp 16

## 2015-08-20 DIAGNOSIS — M2040 Other hammer toe(s) (acquired), unspecified foot: Secondary | ICD-10-CM

## 2015-08-20 DIAGNOSIS — I10 Essential (primary) hypertension: Secondary | ICD-10-CM | POA: Diagnosis not present

## 2015-08-20 DIAGNOSIS — M519 Unspecified thoracic, thoracolumbar and lumbosacral intervertebral disc disorder: Secondary | ICD-10-CM | POA: Diagnosis not present

## 2015-08-20 DIAGNOSIS — M722 Plantar fascial fibromatosis: Secondary | ICD-10-CM | POA: Diagnosis not present

## 2015-08-20 DIAGNOSIS — D472 Monoclonal gammopathy: Secondary | ICD-10-CM | POA: Insufficient documentation

## 2015-08-20 DIAGNOSIS — Z79899 Other long term (current) drug therapy: Secondary | ICD-10-CM | POA: Diagnosis not present

## 2015-08-20 DIAGNOSIS — C61 Malignant neoplasm of prostate: Secondary | ICD-10-CM | POA: Insufficient documentation

## 2015-08-20 DIAGNOSIS — G609 Hereditary and idiopathic neuropathy, unspecified: Secondary | ICD-10-CM | POA: Diagnosis present

## 2015-08-20 DIAGNOSIS — D860 Sarcoidosis of lung: Secondary | ICD-10-CM | POA: Insufficient documentation

## 2015-08-20 DIAGNOSIS — M79673 Pain in unspecified foot: Secondary | ICD-10-CM

## 2015-08-20 DIAGNOSIS — G6289 Other specified polyneuropathies: Secondary | ICD-10-CM

## 2015-08-20 DIAGNOSIS — M5136 Other intervertebral disc degeneration, lumbar region: Secondary | ICD-10-CM | POA: Diagnosis not present

## 2015-08-20 DIAGNOSIS — R351 Nocturia: Secondary | ICD-10-CM | POA: Diagnosis not present

## 2015-08-20 DIAGNOSIS — G8929 Other chronic pain: Secondary | ICD-10-CM

## 2015-08-20 MED ORDER — NONFORMULARY OR COMPOUNDED ITEM: 1.0000 "application " | Freq: Three times a day (TID) | Status: DC

## 2015-08-20 MED ORDER — NORTRIPTYLINE HCL 50 MG PO CAPS: 50.0000 mg | ORAL_CAPSULE | Freq: Every day | ORAL | Status: DC

## 2015-08-20 NOTE — Patient Instructions (Signed)
PLEASE CALL ME WITH ANY PROBLEMS OR QUESTIONS ([phone])  

## 2015-08-20 NOTE — Progress Notes (Signed)
Patient ID: Martin Murphy, male   DOB: March 19, 1965, 50 y.o.   MRN: [id number]  Subjective: 50 year old male presents the office today for concerns of ongoing chronic foot pain. He has continued foot with Dr. Ace Gins. He has returned to work and he had a physician from his employer said that he has flat feet and the discuss orthotics. These are not orthotics. He does have chronic foot pain mostly to the bunion. He also gets pain to his heels and time into the arch of his foot. This has been ongoing for greater than 1 year at this point. He also continues to have neuropathy symptoms. Denies any systemic complaints such as fevers, chills, nausea, vomiting. No acute changes since last appointment, and no other complaints at this time.   Objective: AAO x3, NAD DP/PT pulses palpable bilaterally, CRT less than 3 seconds Sensation decreased with Simms Weinstein monofilament. Incisions from prior surgery all well-healed. Increase range of motion of the first MPJs although still somewhat limited in plantar flexion. Subjectively directly subjective there is tenderness on the medial band of plantar fascia within the arch of the foot however the plantar fascial does appear to be intact. There is no pain currently majority of his pain is made on his feet standing for quite some time.  No areas of pinpoint bony tenderness or pain with vibratory sensation. MMT 5/5, ROM WNL. No edema, erythema, increase in warmth to bilateral lower extremities.  No open lesions or pre-ulcerative lesions.  No pain with calf compression, swelling, warmth, erythema  Assessment: Bilateral chronic foot pain on the neuropathy, plantar fasciitis/tendinitis   Plan: -All treatment options discussed with the patient including all alternatives, risks, complications.  -At this point he said multiple treatments and he will try custom orthotics. She was scanned for inserts they were sent to [facility] labs. Follow up in 3-4 weeks to pick up orthotics  or sooner if needed. -Patient encouraged to call the office with any questions, concerns, change in symptoms.   Celesta Gentile, DPM

## 2015-08-20 NOTE — Progress Notes (Signed)
Subjective:    Patient ID: Martin Murphy, male    DOB: Feb 06, 1966, 50 y.o.   MRN: [id number]  HPI   [name] is here in follow up of his chronic leg and back pain. He went back to work last week on a 4 hour schedule. The most difficult part was sitting for prolonged period of times. The week of work was exhausting but he got through it.   He started the nortriptyline which didn't provide any benefit. The gel hasn't helped much either. He did not yet find any records of prior meds he had used for nerve pain. He is no longer taking gabapentin.     Pain Inventory Average Pain 5 Pain Right Now 4 My pain is burning, stabbing, tingling and aching  In the last 24 hours, has pain interfered with the following? General activity 5 Relation with others 5 Enjoyment of life 6 What TIME of day is your pain at its worst? night Sleep (in general) Poor  Pain is worse with: walking, bending and sitting Pain improves with: rest, heat/ice, therapy/exercise and medication Relief from Meds: 3  Mobility walk without assistance ability to climb steps?  yes do you drive?  yes  Function employed # of hrs/week 20 I need assistance with the following:  household duties  Neuro/Psych No problems in this area  Prior Studies Any changes since last visit?  no  Physicians involved in your care Any changes since last visit?  no   Family History  Problem Relation Age of Onset  . Hypertension Brother   . Cancer Maternal Uncle     prostate, 1 had surgery, 1 had seeds  . Cancer Maternal Grandfather     prostate  . Diabetes Brother   . Colon cancer Neg Hx   . Esophageal cancer Neg Hx   . Stomach cancer Neg Hx   . Rectal cancer Neg Hx    Social History   Social History  . Marital Status: Divorced    Spouse Name: N/A  . Number of Children: N/A  . Years of Education: N/A   Social History Main Topics  . Smoking status: Never Smoker   . Smokeless tobacco: Never Used  . Alcohol Use: No  .  Drug Use: No  . Sexual Activity: Yes   Other Topics Concern  . None   Social History Narrative   Past Surgical History  Procedure Laterality Date  . Prostate biopsy  12/20/12    gleason 6  . Prostate biopsy  06/07/13    gleason 6, vol 36 gms  . Inguinal hernia repair Bilateral right  02-09-2012/   left  1990's  . Bronchoscopy  1990's    w/  biopsy's  . Radioactive seed implant N/A 10/20/2013    Procedure: RADIOACTIVE SEED IMPLANT;  Surgeon: Bernestine Amass, MD;  Location: [facility];  Service: Urology;  Laterality: N/A;  . Bunionectomy    . Foot surgery      3 surgeries total   Past Medical History  Diagnosis Date  . Hypertension   . 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 Prostate cancer ([place]) DX  12/20/2012    biopsies x 2, Gleason 3+3=6  vol 36  . Bunion     bilateral great toe  . Nocturia   . At risk for sleep apnea     STOP-BANG=5    SENT TO PCP 10-13-2013  . Hereditary and idiopathic peripheral neuropathy  09/20/2014   BP 120/83 mmHg  Pulse 91  Resp 16  SpO2 91%  Opioid Risk Score:   Fall Risk Score:  `1  Depression screen PHQ 2/9  Depression screen [facility] 2/9 08/20/2015 07/16/2015 05/25/2015 10/27/2014  Decreased Interest 1 3 1  0  Down, Depressed, Hopeless 0 2 0 0  PHQ - 2 Score 1 5 1  0  Altered sleeping - 2 - -  Tired, decreased energy - 2 - -  Change in appetite - 1 - -  Feeling bad or failure about yourself  - 2 - -  Trouble concentrating - 1 - -  Moving slowly or fidgety/restless - 1 - -  Suicidal thoughts - 1 - -  PHQ-9 Score - 15 - -       Review of Systems  All other systems reviewed and are negative.      Objective:   Physical Exam  General: Alert and oriented x 3, No apparent distress  HEENT: Head is normocephalic, atraumatic, PERRLA, EOMI, sclera anicteric, oral mucosa pink and moist, dentition intact, ext ear canals clear,  Neck: Supple without JVD or lymphadenopathy  Heart: Reg rate and rhythm. No murmurs  rubs or gallops  Chest: CTA bilaterally without wheezes, rales, or rhonchi; no distress  Abdomen: Soft, non-tender, non-distended, bowel sounds positive.  Extremities: No clubbing, cyanosis, or edema. Pulses are 2+  Skin: Clean and intact without signs of breakdown. Scars on feet from bunionectomies.  Neuro: Pt is cognitively appropriate with normal insight, memory, and awareness. Cranial nerves 2-12 are intact. Sensory exam is normal in the UE's. He demonstrates decreased LT/pain sense/vibration below the mid calf---left seemed more affected than right.. Reflexes are 2+ in all 4's. Fine motor coordination is intact. No tremors. Motor function is grossly 5/5. He has functional balance in stance and in walking  Musculoskeletal: Full ROM, No pain with AROM or PROM in the neck, and upper/lower extremities. Posture appropriate. Lumbar tenderness with flexion although he was able to bend to 90+degrees. Extension/rotation/side bending/facet maneuvers/SLR did not provoke pain. Minimal if any back tenderness with palpation and no lumbar muscle spasm was appreciated. i found no tenderness in the glutes, sacrum, or trochs. Arches are flat. Mild antalgia with weightbearing on either leg/foot.  Psych: Pt's affect is appropriate. Pt is cooperative    Assessment & Plan:   1. 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 Degenerative lumbar disc disease  3. Prostate cancer s/p seed implants  4. MGUS   Plan:  1. Titrate nortriptyline to 50mg  qhs.   2. Agree with psycholocial counseling. ?antidepressant?--pamelor may help somewhat 3. rxed compounded cream 20% ketoprofen, 4% ketamine, 5% elavil  4. Continue cymbalta  5. He will check for records of any other medications he's tried for neuropathic pain. Consider trial of trileptal/topamax/tegretol/zonegran  6. 15 minutes of face to face patient care time were spent during this visit. All questions were encouraged and answered. Return in about 1 month  (around 08/16/2015).

## 2015-08-21 ENCOUNTER — Encounter: Payer: Self-pay | Admitting: Physical Medicine & Rehabilitation

## 2015-09-03 ENCOUNTER — Ambulatory Visit (INDEPENDENT_AMBULATORY_CARE_PROVIDER_SITE_OTHER): Payer: 59 | Admitting: Family Medicine

## 2015-09-03 ENCOUNTER — Encounter: Payer: Self-pay | Admitting: Family Medicine

## 2015-09-03 VITALS — BP 120/76 | HR 64 | Ht 71.5 in | Wt 202.0 lb

## 2015-09-03 DIAGNOSIS — Z713 Dietary counseling and surveillance: Secondary | ICD-10-CM | POA: Diagnosis not present

## 2015-09-03 DIAGNOSIS — Z79899 Other long term (current) drug therapy: Secondary | ICD-10-CM

## 2015-09-03 DIAGNOSIS — M545 Low back pain, unspecified: Secondary | ICD-10-CM

## 2015-09-03 DIAGNOSIS — I10 Essential (primary) hypertension: Secondary | ICD-10-CM | POA: Diagnosis not present

## 2015-09-03 DIAGNOSIS — G8929 Other chronic pain: Secondary | ICD-10-CM | POA: Diagnosis not present

## 2015-09-03 NOTE — Progress Notes (Signed)
   Subjective:    Patient ID: Martin Murphy, male    DOB: 1965/11/27, 50 y.o.   MRN: [id number]  HPI Chief Complaint  Patient presents with  . med check    med check- wanting to lose come weight   He is here for medication management visit. He would also like to discuss weight. States he would like to loose abdominal fat. Has been eating more salads since this summer but states his favorite foods are bread and pasta.  He started back to work and is working 4 hours. States he is doing ok with this.  At his last visit, he was trying to establish with a new pain management specialist and since then has started seeing Dr. Naaman Plummer with [facility] and states this is going well. He states stress level is not at high and his mood has improved. Denies any elevated blood pressure readings.  No other concerns or complaints today.  Reviewed allergies, medications, past medical history.   Review of Systems Pertinent positives and negatives in the history of present illness.     Objective:   Physical Exam BP 120/76 mmHg  Pulse 64  Ht 5' 11.5" (1.816 m)  Wt 202 lb (91.627 kg)  BMI 27.78 kg/m2  Alert and oriented in no acute distress. Not otherwise examined.     Assessment & Plan:  Medication management  Weight loss counseling, encounter for  Essential hypertension  Chronic lumbar pain  Blood pressure is within goal. No changes to medication.  Discussed that his BMI places him in the overweight category and congratulated him on losing 6 pounds over the past several months. He expresses that he would like to lose abdominal weight so we discussed cutting back on portion sizes, changing from a dinner size plate to a smaller plate. Cut back on carbohydrates. Use a free app to track calories and carbohydrates. Increase cardiovascular exercise as tolerated. Discussed that weight loss would also be helpful with hypertension management.  He is under the care of Dr. Naaman Plummer for chronic pain and  Will follow-up as scheduled July 25. Advised that he call Dr. Charm Barges office if he is unable to tolerate amitriptyline 50 mg and request that they adjust the dose. He appears to be doing well overall and has a happier disposition. He is back at work part time and feeling good about this. Follow-up after August 26 for annual exam or sooner if needed.

## 2015-09-03 NOTE — Patient Instructions (Addendum)
My Fitness Pal is a free app to track calories and carbohydrates.  Basic Carbohydrate Counting for Diabetes Mellitus Carbohydrate counting is a method for keeping track of the amount of carbohydrates you eat. Eating carbohydrates naturally increases the level of sugar (glucose) in your blood, so it is important for you to know the amount that is okay for you to have in every meal. Carbohydrate counting helps keep the level of glucose in your blood within normal limits. The amount of carbohydrates allowed is different for every person. A dietitian can help you calculate the amount that is right for you. Once you know the amount of carbohydrates you can have, you can count the carbohydrates in the foods you want to eat. Carbohydrates are found in the following foods:  Grains, such as breads and cereals.  Dried beans and soy products.  Starchy vegetables, such as potatoes, peas, and corn.  Fruit and fruit juices.  Milk and yogurt.  Sweets and snack foods, such as cake, cookies, candy, chips, soft drinks, and fruit drinks. CARBOHYDRATE COUNTING There are two ways to count the carbohydrates in your food. You can use either of the methods or a combination of both. Reading the "Nutrition Facts" on [place] The "Nutrition Facts" is an area that is included on the labels of almost all packaged food and beverages in the [place]. It includes the serving size of that food or beverage and information about the nutrients in each serving of the food, including the grams (g) of carbohydrate per serving.  Decide the number of servings of this food or beverage that you will be able to eat or drink. Multiply that number of servings by the number of grams of carbohydrate that is listed on the label for that serving. The total will be the amount of carbohydrates you will be having when you eat or drink this food or beverage. Learning Standard Serving Sizes of Food When you eat food that is not packaged  or does not include "Nutrition Facts" on the label, you need to measure the servings in order to count the amount of carbohydrates.A serving of most carbohydrate-rich foods contains about 15 g of carbohydrates. The following list includes serving sizes of carbohydrate-rich foods that provide 15 g ofcarbohydrate per serving:   1 slice of bread (1 oz) or 1 six-inch tortilla.    of a hamburger bun or English muffin.  4-6 crackers.   cup unsweetened dry cereal.    cup hot cereal.   cup rice or pasta.    cup mashed potatoes or  of a large baked potato.  1 cup fresh fruit or one small piece of fruit.    cup canned or frozen fruit or fruit juice.  1 cup milk.   cup plain fat-free yogurt or yogurt sweetened with artificial sweeteners.   cup cooked dried beans or starchy vegetable, such as peas, corn, or potatoes.  Decide the number of standard-size servings that you will eat. Multiply that number of servings by 15 (the grams of carbohydrates in that serving). For example, if you eat 2 cups of strawberries, you will have eaten 2 servings and 30 g of carbohydrates (2 servings x 15 g = 30 g). For foods such as soups and casseroles, in which more than one food is mixed in, you will need to count the carbohydrates in each food that is included. EXAMPLE OF CARBOHYDRATE COUNTING Sample Dinner  3 oz chicken breast.   cup of brown rice.  cup of corn.  1 cup milk.   1 cup strawberries with sugar-free whipped topping.  Carbohydrate Calculation Step 1: Identify the foods that contain carbohydrates:   Rice.   Corn.   Milk.   Strawberries. Step 2:Calculate the number of servings eaten of each:   2 servings of rice.   1 serving of corn.   1 serving of milk.   1 serving of strawberries. Step 3: Multiply each of those number of servings by 15 g:   2 servings of rice x 15 g = 30 g.   1 serving of corn x 15 g = 15 g.   1 serving of milk x 15 g = 15  g.   1 serving of strawberries x 15 g = 15 g. Step 4: Add together all of the amounts to find the total grams of carbohydrates eaten: 30 g + 15 g + 15 g + 15 g = 75 g.   This information is not intended to replace advice given to you by your health care provider. Make sure you discuss any questions you have with your health care provider.   Document Released: 02/17/2005 Document Revised: 03/10/2014 Document Reviewed: 01/14/2013 Elsevier Interactive Patient Education [employer].

## 2015-09-10 MED ORDER — HYDROCHLOROTHIAZIDE 25 MG PO TABS: 25.0000 mg | ORAL_TABLET | Freq: Every day | ORAL | Status: DC

## 2015-09-10 NOTE — Addendum Note (Signed)
Addended by: Minette Headland A on: 09/10/2015 02:07 PM   Modules accepted: Orders

## 2015-09-25 ENCOUNTER — Encounter: Payer: Self-pay | Admitting: Physical Medicine & Rehabilitation

## 2015-09-25 ENCOUNTER — Encounter: Payer: 59 | Attending: Physical Medicine & Rehabilitation | Admitting: Physical Medicine & Rehabilitation

## 2015-09-25 VITALS — BP 116/79 | HR 85

## 2015-09-25 DIAGNOSIS — Z79899 Other long term (current) drug therapy: Secondary | ICD-10-CM | POA: Insufficient documentation

## 2015-09-25 DIAGNOSIS — D860 Sarcoidosis of lung: Secondary | ICD-10-CM | POA: Insufficient documentation

## 2015-09-25 DIAGNOSIS — G609 Hereditary and idiopathic neuropathy, unspecified: Secondary | ICD-10-CM | POA: Insufficient documentation

## 2015-09-25 DIAGNOSIS — M519 Unspecified thoracic, thoracolumbar and lumbosacral intervertebral disc disorder: Secondary | ICD-10-CM | POA: Diagnosis not present

## 2015-09-25 DIAGNOSIS — R351 Nocturia: Secondary | ICD-10-CM | POA: Diagnosis not present

## 2015-09-25 DIAGNOSIS — M5136 Other intervertebral disc degeneration, lumbar region: Secondary | ICD-10-CM | POA: Diagnosis not present

## 2015-09-25 DIAGNOSIS — M79673 Pain in unspecified foot: Secondary | ICD-10-CM

## 2015-09-25 DIAGNOSIS — C61 Malignant neoplasm of prostate: Secondary | ICD-10-CM | POA: Diagnosis not present

## 2015-09-25 DIAGNOSIS — G8929 Other chronic pain: Secondary | ICD-10-CM

## 2015-09-25 DIAGNOSIS — I10 Essential (primary) hypertension: Secondary | ICD-10-CM | POA: Insufficient documentation

## 2015-09-25 DIAGNOSIS — D472 Monoclonal gammopathy: Secondary | ICD-10-CM | POA: Diagnosis not present

## 2015-09-25 MED ORDER — TRAMADOL HCL 50 MG PO TABS: 50.0000 mg | ORAL_TABLET | Freq: Three times a day (TID) | ORAL | 1 refills | Status: DC | PRN

## 2015-09-25 MED ORDER — NORTRIPTYLINE HCL 25 MG PO CAPS: 25.0000 mg | ORAL_CAPSULE | Freq: Every day | ORAL | 4 refills | Status: DC

## 2015-09-25 MED ORDER — ZONISAMIDE 100 MG PO CAPS: 100.0000 mg | ORAL_CAPSULE | Freq: Two times a day (BID) | ORAL | 3 refills | Status: DC

## 2015-09-25 NOTE — Progress Notes (Signed)
Subjective:    Patient ID: Martin Murphy, male    DOB: 1965-10-15, 50 y.o.   MRN: [id number]  HPI   [name] is here in follow up of his neuropathy and chronic pain. He has returned to work on a part time basis although it's a different job which requires more walking and climbing than his last job.   Pain Inventory Average Pain 4 Pain Right Now 5 My pain is burning, stabbing and tingling  In the last 24 hours, has pain interfered with the following? General activity 4 Relation with others 4 Enjoyment of life 4 What TIME of day is your pain at its worst? night Sleep (in general) Poor  Pain is worse with: walking, standing and some activites Pain improves with: heat/ice and medication Relief from Meds: 3  Mobility walk without assistance ability to climb steps?  yes do you drive?  yes  Function employed # of hrs/week 20 what is your job? [employer]  Neuro/Psych spasms depression  Prior Studies Any changes since last visit?  no  Physicians involved in your care Any changes since last visit?  no   Family History  Problem Relation Age of Onset  . Hypertension Brother   . Cancer Maternal Uncle     prostate, 1 had surgery, 1 had seeds  . Cancer Maternal Grandfather     prostate  . Diabetes Brother   . Colon cancer Neg Hx   . Esophageal cancer Neg Hx   . Stomach cancer Neg Hx   . Rectal cancer Neg Hx    Social History   Social History  . Marital status: Divorced    Spouse name: N/A  . Number of children: N/A  . Years of education: N/A   Social History Main Topics  . Smoking status: Never Smoker  . Smokeless tobacco: Never Used  . Alcohol use No  . Drug use: No  . Sexual activity: Yes   Other Topics Concern  . None   Social History Narrative  . None   Past Surgical History:  Procedure Laterality Date  . BRONCHOSCOPY  1990's   w/  biopsy's  . BUNIONECTOMY    . FOOT SURGERY     3 surgeries total  . INGUINAL HERNIA REPAIR Bilateral right   02-09-2012/   left  1990's  . PROSTATE BIOPSY  12/20/12   gleason 6  . PROSTATE BIOPSY  06/07/13   gleason 6, vol 36 gms  . RADIOACTIVE SEED IMPLANT N/A 10/20/2013   Procedure: RADIOACTIVE SEED IMPLANT;  Surgeon: Bernestine Amass, MD;  Location: [facility];  Service: Urology;  Laterality: N/A;   Past Medical History:  Diagnosis Date  . At risk for sleep apnea    STOP-BANG=5    SENT TO PCP 10-13-2013  . Bunion    bilateral great toe  . 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 Hypertension   . Nocturia   . Prostate cancer ([place]) DX  12/20/2012   biopsies x 2, Gleason 3+3=6  vol 36  . Sarcoidosis of lung ([place])    per lung bx 1990's   BP 116/79   Pulse 85   SpO2 95%   Opioid Risk Score:   Fall Risk Score:  `1  Depression screen PHQ 2/9  Depression screen [facility] 2/9 08/20/2015 07/16/2015 05/25/2015 10/27/2014  Decreased Interest 1 3 1  0  Down, Depressed, Hopeless 0 2 0 0  PHQ - 2 Score 1 5 1  0  Altered sleeping -  2 - -  Tired, decreased energy - 2 - -  Change in appetite - 1 - -  Feeling bad or failure about yourself  - 2 - -  Trouble concentrating - 1 - -  Moving slowly or fidgety/restless - 1 - -  Suicidal thoughts - 1 - -  PHQ-9 Score - 15 - -    Review of Systems  Constitutional: Negative.   HENT: Negative.   Eyes: Negative.   Respiratory: Negative.   Cardiovascular: Negative.   Gastrointestinal: Negative.   Endocrine: Negative.   Genitourinary: Negative.   Musculoskeletal: Negative.   Skin: Negative.   Allergic/Immunologic: Negative.   Neurological: Negative.   Hematological: Negative.   Psychiatric/Behavioral: Negative.        Objective:   Physical Exam      General: Alert and oriented x 3, No apparent distress  HEENT: Head is normocephalic, atraumatic, PERRLA, EOMI, sclera anicteric, oral mucosa pink and moist, dentition intact, ext ear canals clear,  Neck: Supple without JVD or lymphadenopathy  Heart: Reg rate and  rhythm. No murmurs rubs or gallops  Chest: CTA bilaterally without wheezes, rales, or rhonchi; no distress  Abdomen: Soft, non-tender, non-distended, bowel sounds positive.  Extremities: No clubbing, cyanosis, or edema. Pulses are 2+  Skin: Clean and intact without signs of breakdown. Scars on feet from bunionectomies.  Neuro: Pt is cognitively appropriate with normal insight, memory, and awareness. Cranial nerves 2-12 are intact. Sensory exam is normal in the UE's. He demonstrates decreased LT/pain sense/vibration below the mid calf---left seemed more affected than right.. Reflexes are 2+ in all 4's. Fine motor coordination is intact. No tremors. Motor function is grossly 5/5. He has functional balance in stance and in walking  Musculoskeletal: Full ROM, No pain with AROM or PROM in the neck, and upper/lower extremities. Posture appropriate. Lumbar tenderness with flexion  . Extension/rotation/side bending/facet maneuvers/SLR did not provoke pain. Minimal if any back tenderness with palpation and no lumbar muscle spasm was appreciated. i found no tenderness in the glutes, sacrum, or trochs. Arches are average to  flat. Continue  antalgia with weightbearing on either leg/foot.  Psych: Pt's affect is appropriate. Pt is very cooperative    Assessment & Plan:   1. 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 Degenerative lumbar disc disease  3. Prostate cancer s/p seed implants  4. MGUS   Plan:  1. Continue nortriptyline at 25 mg qhs.   2. Continue with psychological counseling as directed 3. He will fill compounded cream 20% ketoprofen, 4% ketamine, 5% elavil  4. Continue cymbalta  5. Will begin trial of zonegran, titrating up to 100mg  bid 6. Low dose tramadol to be used in the evening for breakthrough pain. Do not want him to use this during the day while he works.  7. He may increase his work load to 6 hours per day as of 10/15/15.  8. 25 minutes of face to face patient care time  were spent during this visit. All questions were encouraged and answered. Return in about 2 month (around 08/16/2015).

## 2015-09-25 NOTE — Patient Instructions (Signed)
PLEASE CALL ME WITH ANY PROBLEMS OR QUESTIONS ([phone])  

## 2015-10-03 ENCOUNTER — Ambulatory Visit: Payer: 59 | Admitting: *Deleted

## 2015-10-03 DIAGNOSIS — M722 Plantar fascial fibromatosis: Secondary | ICD-10-CM

## 2015-10-03 NOTE — Progress Notes (Signed)
Patient presents for orthotic pick up.  Verbal and written break in and wear instructions given.  Patient will follow up in 4 weeks if symptoms worsen or fail to improve. 

## 2015-10-03 NOTE — Patient Instructions (Signed)

## 2015-10-28 NOTE — Progress Notes (Signed)
Subjective:    Patient ID: Martin Murphy, male    DOB: 16-Mar-1965, 49 y.o.   MRN: [id number]  HPI Chief Complaint  Patient presents with  . Other    annual exam and fasting   He is here for a complete physical exam. He is fasting today.  Concerns or complaints: none  Other providers: oncologist-  Urologist- Dr Risa Grill. Has appointment in November. Will have PSA then.  Pain management- Dr Naaman Plummer has appointment 11/26/2015 Podiatrist- [place] Neurologist- Dr. Jannifer Franklin nerve conduction in 09/2014 Oncology- [facility] Dr. Earlie Server April 2018  Past medical history: at risk for sleep apnea but negative for sleep apnea as of 12/2014 No new surgical or family history problems.  Diet: eating a lot of salads this summer and watching what he eats.  Exercise: nothing specific. Has started working longer hours.   Immunizations: Tdap 10/27/2014. Flu shot?  Health maintenance:  Colonoscopy: 12/27/2014- due in 2021 Last PSA: followed by urologist Last Dental Exam: has appointment tomorrow Last Eye Exam: within past year Needs a one time Hepatitis C screening test.  STD testing? No HIV test on file. Would like testing.   Wears seatbelt always, uses sunscreen, smoke detectors in home and functioning, does not text while driving, feels safe in home environment.  Reviewed allergies, medications, past medical, surgical, family, and social history.    Review of Systems Review of Systems Constitutional: -fever, -chills, -sweats, -unexpected weight change,-fatigue ENT: -runny nose, -ear pain, -sore throat Cardiology:  -chest pain, -palpitations, -edema Respiratory: -cough, -shortness of breath, -wheezing Gastroenterology: -abdominal pain, -nausea, -vomiting, -diarrhea, -constipation  Hematology: -bleeding or bruising problems Musculoskeletal: +arthralgias, -myalgias, -joint swelling, -back pain Ophthalmology: -vision changes Urology: -dysuria, -difficulty urinating, -hematuria, -urinary  frequency, -urgency Neurology: -headache, -weakness, -tingling, -numbness       Objective:   Physical Exam BP 124/80   Pulse 64   Ht 5' 11.25" (1.81 m)   Wt 200 lb 6.4 oz (90.9 kg)   BMI 27.75 kg/m   General Appearance:    Alert, cooperative, no distress, appears stated age  Head:    Normocephalic, without obvious abnormality, atraumatic  Eyes:    PERRL, conjunctiva/corneas clear, EOM's intact, fundi    benign  Ears:    Normal TM's and external ear canals  Nose:   Nares normal, mucosa normal, no drainage or sinus   tenderness  Throat:   Lips, mucosa, and tongue normal; teeth and gums normal  Neck:   Supple, no lymphadenopathy;  thyroid:  no   enlargement/tenderness/nodules; no carotid   bruit or JVD  Back:    Spine nontender, no curvature, ROM normal, no CVA     tenderness  Lungs:     Clear to auscultation bilaterally without wheezes, rales or     ronchi; respirations unlabored  Chest Wall:    No tenderness or deformity   Heart:    Regular rate and rhythm, S1 and S2 normal, no murmur, rub   or gallop  Breast Exam:    No chest wall tenderness, masses or gynecomastia  Abdomen:     Soft, non-tender, nondistended, normoactive bowel sounds,    no masses, no hepatosplenomegaly  Genitalia:    Declined. Has urologist.   Rectal:    Followed by urologist. Andrey Cota.   Extremities:   No clubbing, cyanosis or edema  Pulses:   2+ and symmetric all extremities  Skin:   Skin color, texture, turgor normal, no rashes or lesions  Lymph nodes:   Cervical, supraclavicular, and  axillary nodes normal  Neurologic:   CNII-XII intact, normal strength, sensation and gait; reflexes 2+ and symmetric throughout          Psych:   Normal mood, affect, hygiene and grooming.    Urinalysis dipstick: trace of blood.      Assessment & Plan:  Routine general medical examination at a health care facility - Plan: CBC with Differential/Platelet, Comprehensive metabolic panel, POCT urinalysis dipstick, TSH, Lipid  panel  Essential hypertension - Plan: CBC with Differential/Platelet, Comprehensive metabolic panel  Vitamin D deficiency - Plan: VITAMIN D 25 Hydroxy (Vit-D Deficiency, Fractures)  Screening for STD (sexually transmitted disease) - Plan: HIV antibody, GC/Chlamydia Probe Amp, RPR  Hematuria, unspecified - Plan: Urine Microscopic  Need for hepatitis C screening test - Plan: Hepatitis C antibody  Screening for lipid disorders - Plan: Lipid panel  HTN- blood pressure is within goal range. Continue healthy diet. Start exercising as tolerated. Congratulated him on recent weight loss.  Discussed watching his carbohydrates and specifically inducing the amount of white foods he eats including pasta, rice, potatoes, sugar. He plans to start working full-time again. His pain management specialist agrees that he can do this. Overall he appears to be doing well and his chronic health conditions appear to be stable. He does have a trace of blood with POCT urinalysis and plan to send for microscopy. One time recommended hepatitis C screening performed. STD testing performed per patient request. Discussed that if his cholesterol comes back elevated that we may need to discuss starting him on a statin. Will need to look at his 10 year  ASCVD risk at that point.  Continue to follow up with pain management and urologist as scheduled. I will follow up pending labs.

## 2015-10-29 ENCOUNTER — Ambulatory Visit (INDEPENDENT_AMBULATORY_CARE_PROVIDER_SITE_OTHER): Payer: 59 | Admitting: Family Medicine

## 2015-10-29 ENCOUNTER — Encounter: Payer: Self-pay | Admitting: Family Medicine

## 2015-10-29 VITALS — BP 124/80 | HR 64 | Ht 71.25 in | Wt 200.4 lb

## 2015-10-29 DIAGNOSIS — E559 Vitamin D deficiency, unspecified: Secondary | ICD-10-CM

## 2015-10-29 DIAGNOSIS — Z13220 Encounter for screening for lipoid disorders: Secondary | ICD-10-CM | POA: Diagnosis not present

## 2015-10-29 DIAGNOSIS — Z0000 Encounter for general adult medical examination without abnormal findings: Secondary | ICD-10-CM | POA: Diagnosis not present

## 2015-10-29 DIAGNOSIS — Z113 Encounter for screening for infections with a predominantly sexual mode of transmission: Secondary | ICD-10-CM

## 2015-10-29 DIAGNOSIS — R319 Hematuria, unspecified: Secondary | ICD-10-CM | POA: Diagnosis not present

## 2015-10-29 DIAGNOSIS — Z1159 Encounter for screening for other viral diseases: Secondary | ICD-10-CM

## 2015-10-29 DIAGNOSIS — I10 Essential (primary) hypertension: Secondary | ICD-10-CM

## 2015-10-29 LAB — CBC WITH DIFFERENTIAL/PLATELET
BASOS ABS: 0 {cells}/uL (ref 0–200)
Basophils Relative: 0 %
EOS ABS: 117 {cells}/uL (ref 15–500)
Eosinophils Relative: 3 %
HCT: 40.1 % (ref 38.5–50.0)
Hemoglobin: 13.5 g/dL (ref 13.2–17.1)
LYMPHS PCT: 32 %
Lymphs Abs: 1248 cells/uL (ref 850–3900)
MCH: 28.1 pg (ref 27.0–33.0)
MCHC: 33.7 g/dL (ref 32.0–36.0)
MCV: 83.5 fL (ref 80.0–100.0)
MONOS PCT: 10 %
MPV: 10.1 fL (ref 7.5–12.5)
Monocytes Absolute: 390 cells/uL (ref 200–950)
NEUTROS PCT: 55 %
Neutro Abs: 2145 cells/uL (ref 1500–7800)
PLATELETS: 235 10*3/uL (ref 140–400)
RBC: 4.8 MIL/uL (ref 4.20–5.80)
RDW: 14.1 % (ref 11.0–15.0)
WBC: 3.9 10*3/uL — AB (ref 4.0–10.5)

## 2015-10-29 LAB — LIPID PANEL
CHOLESTEROL: 208 mg/dL — AB (ref 125–200)
HDL: 52 mg/dL (ref >=40)
LDL CALC: 133 mg/dL — AB (ref <130)
TRIGLYCERIDES: 113 mg/dL (ref <150)
Total CHOL/HDL Ratio: 4 Ratio (ref <=5.0)
VLDL: 23 mg/dL (ref <30)

## 2015-10-29 LAB — POCT URINALYSIS DIPSTICK
BILIRUBIN UA: NEGATIVE
Glucose, UA: NEGATIVE
KETONES UA: NEGATIVE
LEUKOCYTES UA: NEGATIVE
NITRITE UA: NEGATIVE
PROTEIN UA: NEGATIVE
Spec Grav, UA: >=1.03
Urobilinogen, UA: NEGATIVE
pH, UA: 5.5

## 2015-10-29 LAB — HIV ANTIBODY (ROUTINE TESTING W REFLEX): HIV 1&2 Ab, 4th Generation: NONREACTIVE

## 2015-10-29 LAB — COMPREHENSIVE METABOLIC PANEL
ALT: 39 U/L (ref 9–46)
AST: 35 U/L (ref 10–35)
Albumin: 4.1 g/dL (ref 3.6–5.1)
Alkaline Phosphatase: 195 U/L — ABNORMAL HIGH (ref 40–115)
BUN: 14 mg/dL (ref 7–25)
CHLORIDE: 104 mmol/L (ref 98–110)
CO2: 24 mmol/L (ref 20–31)
Calcium: 9.5 mg/dL (ref 8.6–10.3)
Creat: 0.98 mg/dL (ref 0.70–1.33)
GLUCOSE: 91 mg/dL (ref 65–99)
POTASSIUM: 4.6 mmol/L (ref 3.5–5.3)
Sodium: 139 mmol/L (ref 135–146)
Total Bilirubin: 0.5 mg/dL (ref 0.2–1.2)
Total Protein: 7.6 g/dL (ref 6.1–8.1)

## 2015-10-29 LAB — TSH: TSH: 1.71 mIU/L (ref 0.40–4.50)

## 2015-10-29 NOTE — Patient Instructions (Signed)
Preventative Care for Adults, Male       REGULAR HEALTH EXAMS:  A routine yearly physical is a good way to check in with your primary care provider about your health and preventive screening. It is also an opportunity to share updates about your health and any concerns you have, and receive a thorough all-over exam.   Most health insurance companies pay for at least some preventative services.  Check with your health plan for specific coverages.  WHAT PREVENTATIVE SERVICES DO MEN NEED?  Adult men should have their weight and blood pressure checked regularly.   Men age 35 and older should have their cholesterol levels checked regularly.  Beginning at age 50 and continuing to age 75, men should be screened for colorectal cancer.  Certain people should may need continued testing until age 85.  Other cancer screening may include exams for testicular and prostate cancer.  Updating vaccinations is part of preventative care.  Vaccinations help protect against diseases such as the flu.  Lab tests are generally done as part of preventative care to screen for anemia and blood disorders, to screen for problems with the kidneys and liver, to screen for bladder problems, to check blood sugar, and to check your cholesterol level.  Preventative services generally include counseling about diet, exercise, avoiding tobacco, drugs, excessive alcohol consumption, and sexually transmitted infections.    GENERAL RECOMMENDATIONS FOR GOOD HEALTH:  Healthy diet:  Eat a variety of foods, including fruit, vegetables, animal or vegetable protein, such as meat, fish, chicken, and eggs, or beans, lentils, tofu, and grains, such as rice.  Drink plenty of water daily.  Decrease saturated fat in the diet, avoid lots of red meat, processed foods, sweets, fast foods, and fried foods.  Exercise:  Aerobic exercise helps maintain good heart health. At least 30-40 minutes of moderate-intensity exercise is recommended.  For example, a brisk walk that increases your heart rate and breathing. This should be done on most days of the week.   Find a type of exercise or a variety of exercises that you enjoy so that it becomes a part of your daily life.  Examples are running, walking, swimming, water aerobics, and biking.  For motivation and support, explore group exercise such as aerobic class, spin class, Zumba, Yoga,or  martial arts, etc.    Set exercise goals for yourself, such as a certain weight goal, walk or run in a race such as a 5k walk/run.  Speak to your primary care provider about exercise goals.  Disease prevention:  If you smoke or chew tobacco, find out from your caregiver how to quit. It can literally save your life, no matter how long you have been a tobacco user. If you do not use tobacco, never begin.   Maintain a healthy diet and normal weight. Increased weight leads to problems with blood pressure and diabetes.   The Body Mass Index or BMI is a way of measuring how much of your body is fat. Having a BMI above 27 increases the risk of heart disease, diabetes, hypertension, stroke and other problems related to obesity. Your caregiver can help determine your BMI and based on it develop an exercise and dietary program to help you achieve or maintain this important measurement at a healthful level.  High blood pressure causes heart and blood vessel problems.  Persistent high blood pressure should be treated with medicine if weight loss and exercise do not work.   Fat and cholesterol leaves deposits in your arteries   that can block them. This causes heart disease and vessel disease elsewhere in your body.  If your cholesterol is found to be high, or if you have heart disease or certain other medical conditions, then you may need to have your cholesterol monitored frequently and be treated with medication.   Ask if you should have a stress test if your history suggests this. A stress test is a test done on  a treadmill that looks for heart disease. This test can find disease prior to there being a problem.  Avoid drinking alcohol in excess (more than two drinks per day).  Avoid use of street drugs. Do not share needles with anyone. Ask for professional help if you need assistance or instructions on stopping the use of alcohol, cigarettes, and/or drugs.  Brush your teeth twice a day with fluoride toothpaste, and floss once a day. Good oral hygiene prevents tooth decay and gum disease. The problems can be painful, unattractive, and can cause other health problems. Visit your dentist for a routine oral and dental check up and preventive care every 6-12 months.   Look at your skin regularly.  Use a mirror to look at your back. Notify your caregivers of changes in moles, especially if there are changes in shapes, colors, a size larger than a pencil eraser, an irregular border, or development of new moles.  Safety:  Use seatbelts 100% of the time, whether driving or as a passenger.  Use safety devices such as hearing protection if you work in environments with loud noise or significant background noise.  Use safety glasses when doing any work that could send debris in to the eyes.  Use a helmet if you ride a bike or motorcycle.  Use appropriate safety gear for contact sports.  Talk to your caregiver about gun safety.  Use sunscreen with a SPF (or skin protection factor) of 15 or greater.  Lighter skinned people are at a greater risk of skin cancer. Don't forget to also wear sunglasses in order to protect your eyes from too much damaging sunlight. Damaging sunlight can accelerate cataract formation.   Practice safe sex. Use condoms. Condoms are used for birth control and to help reduce the spread of sexually transmitted infections (or STIs).  Some of the STIs are gonorrhea (the clap), chlamydia, syphilis, trichomonas, herpes, HPV (human papilloma virus) and HIV (human immunodeficiency virus) which causes AIDS.  The herpes, HIV and HPV are viral illnesses that have no cure. These can result in disability, cancer and death.   Keep carbon monoxide and smoke detectors in your home functioning at all times. Change the batteries every 6 months or use a model that plugs into the wall.   Vaccinations:  Stay up to date with your tetanus shots and other required immunizations. You should have a booster for tetanus every 10 years. Be sure to get your flu shot every year, since 5%-20% of the [place] population comes down with the flu. The flu vaccine changes each year, so being vaccinated once is not enough. Get your shot in the fall, before the flu season peaks.   Other vaccines to consider:  Pneumococcal vaccine to protect against certain types of pneumonia.  This is normally recommended for adults age 65 or older.  However, adults younger than 52 years old with certain underlying conditions such as diabetes, heart or lung disease should also receive the vaccine.  Shingles vaccine to protect against Varicella Zoster if you are older than age 60, or younger   than 52 years old with certain underlying illness.  Hepatitis A vaccine to protect against a form of infection of the liver by a virus acquired from food.  Hepatitis B vaccine to protect against a form of infection of the liver by a virus acquired from blood or body fluids, particularly if you work in health care.  If you plan to travel internationally, check with your local health department for specific vaccination recommendations.  Cancer Screening:  Most routine colon cancer screening begins at the age of 50. On a yearly basis, doctors may provide special easy to use take-home tests to check for hidden blood in the stool. Sigmoidoscopy or colonoscopy can detect the earliest forms of colon cancer and is life saving. These tests use a small camera at the end of a tube to directly examine the colon. Speak to your caregiver about this at age 50, when routine  screening begins (and is repeated every 5 years unless early forms of pre-cancerous polyps or small growths are found).   At the age of 50 men usually start screening for prostate cancer every year. Screening may begin at a younger age for those with higher risk. Those at higher risk include African-Americans or having a family history of prostate cancer. There are two types of tests for prostate cancer:   Prostate-specific antigen (PSA) testing. Recent studies raise questions about prostate cancer using PSA and you should discuss this with your caregiver.   Digital rectal exam (in which your doctor's lubricated and gloved finger feels for enlargement of the prostate through the anus).   Screening for testicular cancer.  Do a monthly exam of your testicles. Gently roll each testicle between your thumb and fingers, feeling for any abnormal lumps. The best time to do this is after a hot shower or bath when the tissues are looser. Notify your caregivers of any lumps, tenderness or changes in size or shape immediately.     

## 2015-10-30 LAB — URINALYSIS, MICROSCOPIC ONLY
BACTERIA UA: NONE SEEN [HPF]
CASTS: NONE SEEN [LPF]
CRYSTALS: NONE SEEN [HPF]
SQUAMOUS EPITHELIAL / LPF: NONE SEEN [HPF] (ref <=5)
WBC, UA: NONE SEEN WBC/HPF (ref <=5)
YEAST: NONE SEEN [HPF]

## 2015-10-30 LAB — GC/CHLAMYDIA PROBE AMP
CT Probe RNA: NOT DETECTED
GC Probe RNA: NOT DETECTED

## 2015-10-30 LAB — HEPATITIS C ANTIBODY: HCV Ab: NEGATIVE

## 2015-10-30 LAB — VITAMIN D 25 HYDROXY (VIT D DEFICIENCY, FRACTURES): Vit D, 25-Hydroxy: 113 ng/mL — ABNORMAL HIGH (ref 30–100)

## 2015-10-30 LAB — RPR

## 2015-11-26 ENCOUNTER — Encounter: Payer: 59 | Attending: Physical Medicine & Rehabilitation | Admitting: Physical Medicine & Rehabilitation

## 2015-11-26 ENCOUNTER — Encounter: Payer: Self-pay | Admitting: Physical Medicine & Rehabilitation

## 2015-11-26 DIAGNOSIS — R351 Nocturia: Secondary | ICD-10-CM | POA: Diagnosis not present

## 2015-11-26 DIAGNOSIS — M5136 Other intervertebral disc degeneration, lumbar region: Secondary | ICD-10-CM | POA: Diagnosis not present

## 2015-11-26 DIAGNOSIS — G609 Hereditary and idiopathic neuropathy, unspecified: Secondary | ICD-10-CM | POA: Insufficient documentation

## 2015-11-26 DIAGNOSIS — Z79899 Other long term (current) drug therapy: Secondary | ICD-10-CM | POA: Insufficient documentation

## 2015-11-26 DIAGNOSIS — D860 Sarcoidosis of lung: Secondary | ICD-10-CM | POA: Insufficient documentation

## 2015-11-26 DIAGNOSIS — D472 Monoclonal gammopathy: Secondary | ICD-10-CM | POA: Insufficient documentation

## 2015-11-26 DIAGNOSIS — M519 Unspecified thoracic, thoracolumbar and lumbosacral intervertebral disc disorder: Secondary | ICD-10-CM | POA: Diagnosis not present

## 2015-11-26 DIAGNOSIS — C61 Malignant neoplasm of prostate: Secondary | ICD-10-CM | POA: Insufficient documentation

## 2015-11-26 DIAGNOSIS — I10 Essential (primary) hypertension: Secondary | ICD-10-CM | POA: Diagnosis not present

## 2015-11-26 MED ORDER — OXYCODONE HCL 5 MG PO TABS: 5.0000 mg | ORAL_TABLET | Freq: Every evening | ORAL | 0 refills | Status: DC | PRN

## 2015-11-26 MED ORDER — NONFORMULARY OR COMPOUNDED ITEM: 1.0000 "application " | Freq: Three times a day (TID) | 4 refills | Status: DC

## 2015-11-26 NOTE — Progress Notes (Signed)
Subjective:    Patient ID: Martin Murphy, male    DOB: 1965-07-03, 50 y.o.   MRN: [id number]  HPI   Martin Murphy is here in follow up of his chronic pain. He didn't experience much relief with the zonegran. It just made him sleepy in the mornings. He was only able to take it at night as well for this reason. He is not finding the tramadol is helpful either. He continues to work 6 hours per day, sometimes at risk of his own safety. He states that he's trying to be extra careful.    Pain Inventory Average Pain 6 Pain Right Now 6 My pain is burning, stabbing and tingling  In the last 24 hours, has pain interfered with the following? General activity 4 Relation with others 4 Enjoyment of life 5 What TIME of day is your pain at its worst? night Sleep (in general) Poor  Pain is worse with: walking and standing Pain improves with: heat/ice and medication Relief from Meds: 0  Mobility walk without assistance ability to climb steps?  yes do you drive?  yes  Function employed # of hrs/week 32 I need assistance with the following:  shopping  Neuro/Psych weakness numbness  Prior Studies Any changes since last visit?  no  Physicians involved in your care Any changes since last visit?  no   Family History  Problem Relation Age of Onset  . Hypertension Brother   . Cancer Maternal Uncle     prostate, 1 had surgery, 1 had seeds  . Cancer Maternal Grandfather     prostate  . Diabetes Brother   . Colon cancer Neg Hx   . Esophageal cancer Neg Hx   . Stomach cancer Neg Hx   . Rectal cancer Neg Hx    Social History   Social History  . Marital status: Divorced    Spouse name: N/A  . Number of children: N/A  . Years of education: N/A   Social History Main Topics  . Smoking status: Never Smoker  . Smokeless tobacco: Never Used  . Alcohol use No  . Drug use: No  . Sexual activity: Yes   Other Topics Concern  . None   Social History Narrative  . None   Past Surgical  History:  Procedure Laterality Date  . BRONCHOSCOPY  1990's   w/  biopsy's  . BUNIONECTOMY    . FOOT SURGERY     3 surgeries total  . INGUINAL HERNIA REPAIR Bilateral right  02-09-2012/   left  1990's  . PROSTATE BIOPSY  12/20/12   gleason 6  . PROSTATE BIOPSY  06/07/13   gleason 6, vol 36 gms  . RADIOACTIVE SEED IMPLANT N/A 10/20/2013   Procedure: RADIOACTIVE SEED IMPLANT;  Surgeon: Bernestine Amass, MD;  Location: [facility];  Service: Urology;  Laterality: N/A;   Past Medical History:  Diagnosis Date  . At risk for sleep apnea    STOP-BANG=5    SENT TO PCP 10-13-2013  . Bunion    bilateral great toe  . 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 Hypertension   . Nocturia   . Prostate cancer ([place]) DX  12/20/2012   biopsies x 2, Gleason 3+3=6  vol 36  . 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   Eyes: Negative.   Respiratory: Negative.   Cardiovascular: Negative.   Gastrointestinal: Negative.   Endocrine: Negative.   Genitourinary: Negative.   Musculoskeletal: Positive for back pain.  Skin: Negative.   Allergic/Immunologic: Negative.   Neurological: Positive for weakness and numbness.  Hematological: Negative.   Psychiatric/Behavioral: Negative.        Objective:   Physical Exam  General: Alert and oriented x 3, No apparent distress  HEENT: Head is normocephalic,  atraumatic, PERRLA, EOMI, sclera anicteric, oral mucosa pink and moist, dentition intact, ext ear canals clear,  Neck: Supple without JVD or lymphadenopathy  Heart: Reg rate and rhythm. No murmurs rubs or gallops  Chest: CTA bilaterally without wheezes, rales, or rhonchi; no distress  Abdomen: Soft, non-tender, non-distended, bowel sounds positive.  Extremities: No clubbing, cyanosis, or edema. Pulses are 2+  Skin: Clean and intact without signs of breakdown. Scars on feet from bunionectomies.  Neuro: Pt is cognitively appropriate with normal insight, memory, and awareness. Cranial nerves 2-12 are intact. Sensory exam is normal in the UE's. He demonstrates decreased LT/pain sense/vibration below the mid calf---left seemed more affected than right.. Reflexes are 2+ in all 4's. Fine motor coordination is intact. No tremors. Motor function is grossly 5/5. He has functional balance in stance and in walking  Musculoskeletal: Full ROM, No pain with AROM or PROM in the neck, and upper/lower extremities. Posture appropriate. Lumbar tenderness with flexion  . Extension/rotation/side bending/facet maneuvers/SLR did not provoke pain. Minimal if any back tenderness with palpation and no lumbar muscle spasm was appreciated. i found no tenderness in the glutes, sacrum, or trochs. Arches are flat. Continued  antalgia with weightbearing on either leg/foot.  Psych: Pt's affect is appropriate.     Assessment & Plan:   1. 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 Degenerative lumbar disc disease  3. Prostate cancer s/p seed implants  4. MGUS   Plan:  1. Continue nortriptyline at 25 mg qhs.   2. Continue with psychological counseling as directed 3. Will change compounded cream 20% ketoprofen, 8% ketamine, 5% elavil  4. Continue cymbalta  5. Refer to Dr. Carroll Kinds for sympathetic nerve block.  6. Low dose oxycodone for breakthrough pain at night. 5mg    7. Continue work at 6 hours per day for  two weeks then increase to 8 hours per day.  8. 25 minutes of face to face patient care time were spent during this visit. All questions were encouraged and answered. Return in about 1 month (around 08/16/2015).

## 2015-11-26 NOTE — Patient Instructions (Signed)
PLEASE CALL ME WITH ANY PROBLEMS OR QUESTIONS ([phone])  

## 2016-01-02 ENCOUNTER — Encounter (INDEPENDENT_AMBULATORY_CARE_PROVIDER_SITE_OTHER): Payer: Self-pay

## 2016-01-02 ENCOUNTER — Encounter: Payer: 59 | Attending: Physical Medicine & Rehabilitation | Admitting: Physical Medicine & Rehabilitation

## 2016-01-02 ENCOUNTER — Encounter: Payer: Self-pay | Admitting: Physical Medicine & Rehabilitation

## 2016-01-02 VITALS — BP 136/92 | HR 94 | Resp 14

## 2016-01-02 DIAGNOSIS — Z5181 Encounter for therapeutic drug level monitoring: Secondary | ICD-10-CM

## 2016-01-02 DIAGNOSIS — M5136 Other intervertebral disc degeneration, lumbar region: Secondary | ICD-10-CM | POA: Diagnosis not present

## 2016-01-02 DIAGNOSIS — I10 Essential (primary) hypertension: Secondary | ICD-10-CM | POA: Insufficient documentation

## 2016-01-02 DIAGNOSIS — M519 Unspecified thoracic, thoracolumbar and lumbosacral intervertebral disc disorder: Secondary | ICD-10-CM | POA: Diagnosis not present

## 2016-01-02 DIAGNOSIS — D472 Monoclonal gammopathy: Secondary | ICD-10-CM

## 2016-01-02 DIAGNOSIS — Z79899 Other long term (current) drug therapy: Secondary | ICD-10-CM | POA: Diagnosis not present

## 2016-01-02 DIAGNOSIS — G609 Hereditary and idiopathic neuropathy, unspecified: Secondary | ICD-10-CM | POA: Diagnosis not present

## 2016-01-02 DIAGNOSIS — C61 Malignant neoplasm of prostate: Secondary | ICD-10-CM | POA: Diagnosis not present

## 2016-01-02 DIAGNOSIS — R351 Nocturia: Secondary | ICD-10-CM | POA: Diagnosis not present

## 2016-01-02 DIAGNOSIS — D860 Sarcoidosis of lung: Secondary | ICD-10-CM | POA: Diagnosis not present

## 2016-01-02 MED ORDER — OXYCODONE HCL 10 MG PO TABS: 10.0000 mg | ORAL_TABLET | Freq: Every evening | ORAL | 0 refills | Status: DC | PRN

## 2016-01-02 NOTE — Patient Instructions (Signed)
PLEASE CALL ME WITH ANY PROBLEMS OR QUESTIONS ([phone])  

## 2016-01-02 NOTE — Progress Notes (Signed)
Subjective:    Patient ID: Martin Murphy, male    DOB: Mar 07, 1965, 50 y.o.   MRN: [id number]  HPI   Martin Murphy is here in follow up of his chronic pain. He had the sympathetic ganglion blocks yesterday. He hasn't noticed any difference as of yet.  The analgesic cream hasn't made a huge difference. He has found the oxycodone helpful at night, particularly at the 10mg  dose. The 10mg  did not make him drowsy the next day.   He has progressed to work full time over the last month. Things have gone fairly well. However, the more he's up on his feet, the more severe his pain is. He hasn't had any falls or mishaps due to his feet/balance.    Pain Inventory Average Pain 4 Pain Right Now 4 My pain is sharp, burning, stabbing and tingling  In the last 24 hours, has pain interfered with the following? General activity 2 Relation with others 3 Enjoyment of life 4 What TIME of day is your pain at its worst? night Sleep (in general) Poor  Pain is worse with: walking and standing Pain improves with: heat/ice Relief from Meds: n/a  Mobility walk without assistance how many minutes can you walk? 45 ability to climb steps?  no do you drive?  yes Do you have any goals in this area?  yes  Function employed # of hrs/week 40 Do you have any goals in this area?  no  Neuro/Psych numbness tingling  Prior Studies Any changes since last visit?  no  Physicians involved in your care Any changes since last visit?  no   Family History  Problem Relation Age of Onset  . Hypertension Brother   . Cancer Maternal Uncle     prostate, 1 had surgery, 1 had seeds  . Cancer Maternal Grandfather     prostate  . Diabetes Brother   . Colon cancer Neg Hx   . Esophageal cancer Neg Hx   . Stomach cancer Neg Hx   . Rectal cancer Neg Hx    Social History   Social History  . Marital status: Divorced    Spouse name: N/A  . Number of children: N/A  . Years of education: N/A   Social History Main  Topics  . Smoking status: Never Smoker  . Smokeless tobacco: Never Used  . Alcohol use No  . Drug use: No  . Sexual activity: Yes   Other Topics Concern  . None   Social History Narrative  . None   Past Surgical History:  Procedure Laterality Date  . BRONCHOSCOPY  1990's   w/  biopsy's  . BUNIONECTOMY    . FOOT SURGERY     3 surgeries total  . INGUINAL HERNIA REPAIR Bilateral right  02-09-2012/   left  1990's  . PROSTATE BIOPSY  12/20/12   gleason 6  . PROSTATE BIOPSY  06/07/13   gleason 6, vol 36 gms  . RADIOACTIVE SEED IMPLANT N/A 10/20/2013   Procedure: RADIOACTIVE SEED IMPLANT;  Surgeon: Bernestine Amass, MD;  Location: [facility];  Service: Urology;  Laterality: N/A;   Past Medical History:  Diagnosis Date  . At risk for sleep apnea    STOP-BANG=5    SENT TO PCP 10-13-2013  . Bunion    bilateral great toe  . 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 Hypertension   . Nocturia   . Prostate cancer ([place]) DX  12/20/2012   biopsies x 2,  Gleason 3+3=6  vol 36  . 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   BP (!) 136/92   Pulse 94   Resp 14   SpO2 95%   Opioid Risk Score:   Fall Risk Score:  `1  Depression screen PHQ 2/9  Depression screen [facility] 2/9 08/20/2015 07/16/2015 05/25/2015 10/27/2014  Decreased Interest 1 3 1  0  Down, Depressed, Hopeless 0 2 0 0  PHQ - 2 Score 1 5 1  0  Altered sleeping - 2 - -  Tired, decreased energy - 2 - -  Change in appetite - 1 - -  Feeling bad or failure about yourself  - 2 - -  Trouble concentrating - 1 - -  Moving slowly or fidgety/restless - 1 - -  Suicidal thoughts - 1 - -  PHQ-9 Score - 15 - -     Review of Systems  All other systems reviewed and are negative.      Objective:   Physical Exam  General: Alert and oriented x 3, No apparent distress  HEENT:Head is normocephalic, atraumatic, PERRLA, EOMI, sclera anicteric, oral mucosa pink and moist, dentition intact, ext ear canals  clear,  Neck:Supple without JVD or lymphadenopathy  Heart:Reg rate and rhythm. No murmurs rubs or gallops  Chest:CTA bilaterally without wheezes, rales, or rhonchi; no distress  Abdomen:Soft, non-tender, non-distended, bowel sounds positive.  Extremities:No clubbing, cyanosis, or edema. Pulses are 2+  Skin:Clean and intact without signs of breakdown. Scars on feet from bunionectomies.  Neuro:Pt is cognitively appropriate with normal insight, memory, and awareness. Cranial nerves 2-12 are intact. Sensory exam is normal in the UE's. He demonstrates decreased LT/pain sense/vibration below the mid calf---left seemed more affected than right.. Reflexes are 2+ in all 4's.  Motor function is grossly 5/5. He has good balance  Musculoskeletal:Full ROM, No pain with AROM or PROM in the neck, and upper/lower extremities. Posture appropriate. Lumbar tenderness with flexion . Extension/rotation/side bending/facet maneuvers/SLR did not provoke pain. Mild pain in the low back  Continued antalgia with weightbearing on either leg/foot.  Psych:Pt's affect is appropriate.     Assessment & Plan:  1. 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 Degenerative lumbar disc disease  3. Prostate cancer s/p seed implants  4. MGUS    Plan:  1. Continue nortriptyline at 25 mg qhs.  2. Continue with psychological counseling as directed 3. Compounded cream 20% ketoprofen, 8% ketamine, 5% elavil ---can continue using if he wishes. 4. Continue cymbalta  5. Lumbar sympathetic ganglion block/plan/follow up per Dr. Maryjean Ka. .  6. Low dose oxycodone for breakthrough pain at night. 5mg    7. Continue work at 8 hours per day. recommend light physical duty 8. 25 minutes of face to face patient care time were spent during this visit. All questions were encouraged and answered. Return in about 46months  .

## 2016-01-02 NOTE — Addendum Note (Signed)
Addended by: Valere Dross on: 01/02/2016 03:34 PM   Modules accepted: Orders

## 2016-01-10 LAB — TOXASSURE SELECT,+ANTIDEPR,UR

## 2016-01-14 NOTE — Progress Notes (Signed)
Urine drug screen for this encounter is consistent for prescribed medication 

## 2016-01-15 ENCOUNTER — Encounter: Payer: Self-pay | Admitting: Family Medicine

## 2016-01-15 ENCOUNTER — Ambulatory Visit (INDEPENDENT_AMBULATORY_CARE_PROVIDER_SITE_OTHER): Payer: 59 | Admitting: Family Medicine

## 2016-01-15 VITALS — BP 124/80 | HR 77 | Wt 197.4 lb

## 2016-01-15 DIAGNOSIS — R748 Abnormal levels of other serum enzymes: Secondary | ICD-10-CM

## 2016-01-15 DIAGNOSIS — I10 Essential (primary) hypertension: Secondary | ICD-10-CM | POA: Diagnosis not present

## 2016-01-15 DIAGNOSIS — E7800 Pure hypercholesterolemia, unspecified: Secondary | ICD-10-CM | POA: Diagnosis not present

## 2016-01-15 DIAGNOSIS — R7989 Other specified abnormal findings of blood chemistry: Secondary | ICD-10-CM | POA: Diagnosis not present

## 2016-01-15 LAB — COMPREHENSIVE METABOLIC PANEL
ALBUMIN: 4.1 g/dL (ref 3.6–5.1)
ALK PHOS: 218 U/L — AB (ref 40–115)
ALT: 38 U/L (ref 9–46)
AST: 36 U/L — AB (ref 10–35)
BILIRUBIN TOTAL: 0.5 mg/dL (ref 0.2–1.2)
BUN: 16 mg/dL (ref 7–25)
CALCIUM: 9.4 mg/dL (ref 8.6–10.3)
CO2: 28 mmol/L (ref 20–31)
Chloride: 102 mmol/L (ref 98–110)
Creat: 1.08 mg/dL (ref 0.70–1.33)
Glucose, Bld: 95 mg/dL (ref 65–99)
POTASSIUM: 5.4 mmol/L — AB (ref 3.5–5.3)
Sodium: 137 mmol/L (ref 135–146)
TOTAL PROTEIN: 7.6 g/dL (ref 6.1–8.1)

## 2016-01-15 LAB — LIPID PANEL
CHOLESTEROL: 232 mg/dL — AB (ref <200)
HDL: 60 mg/dL (ref >40)
LDL CALC: 156 mg/dL — AB (ref <100)
TRIGLYCERIDES: 81 mg/dL (ref <150)
Total CHOL/HDL Ratio: 3.9 Ratio (ref <5.0)
VLDL: 16 mg/dL (ref <30)

## 2016-01-15 NOTE — Progress Notes (Signed)
Subjective:    Patient ID: Martin Murphy, male    DOB: 1965/10/04, 50 y.o.   MRN: [id number]  HPI Chief Complaint  Patient presents with  . 2 month folllow-up    2 month fasting follow-up   He is here today for a two-month follow-up on an elevated vitamin D level and elevated LDL.  He stopped taking vitamin D approximately 2 months ago.  He denies fever, chills, dizziness, fatigue, chest pain, nausea, vomiting, diarrhea, constipation, decreased appetite.  He denies having muscle aches or weakness.  Fatigue has improved and states he does not have a problem with this now.   His LCL was elevated at our last visit. He wanted to try lifestyle modifications such as eating a healthy diet, low in cholesterol and fat and trying to exercise before considering cholesterol medication.   He continues to follow-up with his pain management specialist Dr. Tessa Lerner. States he is working full-time now. States he does a lot walking.   Had back injections with Dr. Luan Pulling and patient states this didn't help with his back and foot pain. He did follow up and told him about this.   Past Medical History:  Diagnosis Date  . At risk for sleep apnea    STOP-BANG=5    SENT TO PCP 10-13-2013  . Bunion    bilateral great toe  . 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 Hypertension   . Nocturia   . Prostate cancer ([place]) DX  12/20/2012   biopsies x 2, Gleason 3+3=6  vol 36  . Sarcoidosis of lung ([place])    per lung bx 1990's     Review of Systems Pertinent positives and negatives in the history of present illness.     Objective:   Physical Exam  Constitutional: He is oriented to person, place, and time. He appears well-developed and well-nourished. No distress.  Cardiovascular: Normal rate, regular rhythm, normal heart sounds and intact distal pulses.  Exam reveals no gallop and no friction rub.   No murmur heard. Pulmonary/Chest: Effort normal and breath sounds normal.    Abdominal: Soft. Bowel sounds are normal. He exhibits no distension. There is no hepatosplenomegaly. There is no tenderness. There is no rebound, no guarding, no tenderness at McBurney's point and negative Murphy's sign.  Neurological: He is alert and oriented to person, place, and time. He has normal strength. Gait normal.  Skin: Skin is warm and dry. No pallor.  Psychiatric: He has a normal mood and affect. His speech is normal and behavior is normal. Judgment and thought content normal. Cognition and memory are normal.   BP 124/80   Pulse 77   Wt 197 lb 6.4 oz (89.5 kg)   BMI 27.34 kg/m       Assessment & Plan:  High serum vitamin D - Plan: VITAMIN D 25 Hydroxy (Vit-D Deficiency, Fractures)  Elevated LDL cholesterol level - Plan: Lipid panel  Essential hypertension  Elevated serum alkaline phosphatase level - Plan: Comprehensive metabolic panel  Discussed that his blood pressure if within goal range. Continue current medications.  Elevated LDL- he wanted to try lifestyle modifications and has been doing this for a couple of months at least and did not want to start cholesterol medication. Continue with healthy diet and low fat, low cholesterol. Discussed ASCVD rsk and how uncontrolled cholesterol increases his risk of developing heart disease. Plan to repeat fasting lipids.  He has been off vitamin D for the past 2 months due to elevated  serum vitamin D. Plan to repeat his vitamin D level today.  Discussed that his alk phos has been chronically elevated and plan to investigate this further. His LFTs were normal at his last visit.  Plan to have him follow up in 6 months pending lab results.

## 2016-01-16 ENCOUNTER — Other Ambulatory Visit: Payer: Self-pay | Admitting: Family Medicine

## 2016-01-16 DIAGNOSIS — R748 Abnormal levels of other serum enzymes: Secondary | ICD-10-CM

## 2016-01-16 DIAGNOSIS — E875 Hyperkalemia: Secondary | ICD-10-CM

## 2016-01-16 LAB — VITAMIN D 25 HYDROXY (VIT D DEFICIENCY, FRACTURES): Vit D, 25-Hydroxy: 48 ng/mL (ref 30–100)

## 2016-01-21 ENCOUNTER — Other Ambulatory Visit: Payer: 59

## 2016-01-21 DIAGNOSIS — E875 Hyperkalemia: Secondary | ICD-10-CM

## 2016-01-21 LAB — BASIC METABOLIC PANEL
BUN: 13 mg/dL (ref 7–25)
CALCIUM: 9.5 mg/dL (ref 8.6–10.3)
CO2: 30 mmol/L (ref 20–31)
CREATININE: 0.96 mg/dL (ref 0.70–1.33)
Chloride: 103 mmol/L (ref 98–110)
GLUCOSE: 89 mg/dL (ref 65–99)
Potassium: 4.2 mmol/L (ref 3.5–5.3)
Sodium: 139 mmol/L (ref 135–146)

## 2016-01-22 ENCOUNTER — Encounter: Payer: Self-pay | Admitting: Physician Assistant

## 2016-01-22 ENCOUNTER — Other Ambulatory Visit (INDEPENDENT_AMBULATORY_CARE_PROVIDER_SITE_OTHER): Payer: 59

## 2016-01-22 ENCOUNTER — Ambulatory Visit (INDEPENDENT_AMBULATORY_CARE_PROVIDER_SITE_OTHER): Payer: 59 | Admitting: Physician Assistant

## 2016-01-22 VITALS — BP 116/72 | HR 76 | Ht 71.25 in | Wt 197.2 lb

## 2016-01-22 DIAGNOSIS — R7989 Other specified abnormal findings of blood chemistry: Secondary | ICD-10-CM | POA: Diagnosis not present

## 2016-01-22 DIAGNOSIS — R945 Abnormal results of liver function studies: Principal | ICD-10-CM

## 2016-01-22 LAB — GAMMA GT: GGT: 573 U/L — AB (ref 7–51)

## 2016-01-22 LAB — PROTIME-INR
INR: 1 ratio (ref 0.8–1.0)
Prothrombin Time: 10.7 s (ref 9.6–13.1)

## 2016-01-22 LAB — HIGH SENSITIVITY CRP: CRP, High Sensitivity: 8.04 mg/L — ABNORMAL HIGH (ref 0.000–5.000)

## 2016-01-22 LAB — FERRITIN: Ferritin: 116 ng/mL (ref 22.0–322.0)

## 2016-01-22 LAB — SEDIMENTATION RATE: SED RATE: 34 mm/h — AB (ref 0–20)

## 2016-01-22 NOTE — Patient Instructions (Signed)
Please go to the basement level to have your labs drawn.   You have been scheduled for an abdominal ultrasound at [facility] (1st floor of hospital) on Friday 02-01-2016 at 8:30 am. Please arrive at 8:15 minutes prior to your appointment for registration. Make certain not to have anything to eat or drink after midnight to your appointment. Should you need to reschedule your appointment, please contact radiology at [phone]. This test typically takes about 30 minutes to perform.

## 2016-01-22 NOTE — Progress Notes (Signed)
Agree with assessment and plan as outlined.  

## 2016-01-22 NOTE — Progress Notes (Signed)
Subjective:    Patient ID: Martin Murphy, male    DOB: 11/20/1965, 50 y.o.   MRN: [id number]  HPI Martin Murphy is a 50 year old white male known to Martin Murphy from colonoscopy done last fall. He comes in today for evaluation of persistently elevated liver function studies. He is referred by Martin Dingwall Martin Murphy. Colonoscopy done October 2016 finding of mild diverticulosis he had 2 diminutive polyps one was a tubular adenoma 1 showing benign colonic mucosa.  He was recommended for 5 year interval follow-up. Patient has history of hypertension, peripheral neuropathy, history of prostate cancer with previous CVA implants. He states that his  disease is controlled and last PSA done recently was 0.25. Patient also has a diagnosis of MGUS and is followed by oncology. He had recent labs done noting an alkaline phosphatase of 218 AST of 36. Reviewing his chart he had an elevated alkaline phosphatase 2 years ago of 189 and in March 2015 had an alkaline phosphatase of 280 AST 49 ALT of 60. Patient remembers being told several years ago that he had elevated liver tests. Upper abdominal ultrasound in 2015  liver read as normal He does not consume any regular alcohol and has not ever been a heavy drinker. There is no family history of liver disease that he is aware of. Hepatitis C antibody done within the past year was negative. He has no current GI complaints.  Review of Systems Pertinent positive and negative review of systems were noted in the above HPI section.  All other review of systems was otherwise negative.  Outpatient Encounter Prescriptions as of 01/22/2016  Medication Sig  . Ascorbic Acid (VITAMIN C PO) Take by mouth daily. Reported on 05/25/2015  . hydrochlorothiazide (HYDRODIURIL) 25 MG tablet Take 1 tablet (25 mg total) by mouth daily.  Marland Kitchen MAGNESIUM PO Take by mouth daily. Reported on 05/25/2015  . Oxycodone HCl 10 MG TABS Take 1 tablet (10 mg total) by mouth at bedtime as needed (pain).  .  tamsulosin (FLOMAX) 0.4 MG CAPS capsule Take 0.4 mg by mouth. Reported on 05/25/2015  . vitamin B-12 (CYANOCOBALAMIN) 1000 MCG tablet Take 1,000 mcg by mouth daily. Reported on 05/25/2015   No facility-administered encounter medications on file as of 01/22/2016.    No Known Allergies Patient Active Problem List   Diagnosis Date Noted  . High serum vitamin D 01/15/2016  . Elevated LDL cholesterol level 01/15/2016  . Lumbar disc disease 07/16/2015  . Chronic lumbar pain 07/02/2015  . Pain, foot, chronic 07/02/2015  . Vitamin D deficiency 04/27/2015  . Low testosterone 04/27/2015  . Peripheral neuropathy ([place]) 02/18/2015  . 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 Bunion of great toe 03/31/2014  . Liver dysfunction 12/28/2013  . HTN (hypertension) 10/28/2013  . Malignant neoplasm of prostate ([place]) 08/02/2013  . MGUS (monoclonal gammopathy of unknown significance) 05/31/2013   Social History   Social History  . Marital status: Divorced    Spouse name: N/A  . Number of children: N/A  . Years of education: N/A   Occupational History  . Not on file.   Social History Main Topics  . Smoking status: Never Smoker  . Smokeless tobacco: Never Used  . Alcohol use No  . Drug use: No  . Sexual activity: Yes   Other Topics Concern  . Not on file   Social History Narrative  . No narrative on file    Martin Murphy family history includes Cancer in his maternal grandfather; Diabetes in  his brother; Hypertension in his brother; Prostate cancer in his maternal uncle.      Objective:    Vitals:   01/22/16 1329  BP: 116/72  Pulse: 76    Physical Exam  well-developed African-American male in no acute distress, blood pressure 116/72 pulse 76, height 5 foot 11 weight 197, BMI 27.3. HEENT; nontraumatic normocephalic EOMI PERRLA sclera anicteric, Cardiovascular; regular rate and rhythm with S1-S2 no murmur or gallop, Pulmonary ;clear bilaterally, Abdomen ;soft  nontender nondistended bowel sounds are active there is no palpable mass or hepatosplenomegaly, Rectal; exam not done, Extremities ;no clubbing cyanosis or edema skin warm and dry, Neuropsych; mood and affect appropriate       Assessment & Plan:   #56 50 year old African-American male with persistent LFT elevation over the past couple of years with predominance of elevated alkaline phosphatase. Rule out underlying chronic liver disease. Consider MGUS as contributory factor Patient has history of prostate cancer, no previous evidence for bone involvement  #2 diverticulosis #3 history of adenomatous colon polyp October 2016 will need follow-up colonoscopy 2021 #4 hypertension #5 peripheral neuropathy #6 MGUS  Plan; Schedule abdominal ultrasound Check chronic hepatitis serologies,PT/INR, autoimmune markers to include ANA and AMA, ferritin and metabolic markers including alpha-1 antitrypsin, ceruloplasmin.  Will also check GGT and 5 prime nucleotidase. Office follow-up with Martin Murphy or myself in 6-8 weeks.     Martin Murphy 01/22/2016   Cc: Martin Rm, Martin Murphy

## 2016-01-23 LAB — HEPATITIS C ANTIBODY: HCV AB: NEGATIVE

## 2016-01-23 LAB — ANA: ANA: NEGATIVE

## 2016-01-23 LAB — MITOCHONDRIAL ANTIBODIES: Mitochondrial M2 Ab, IgG: <=20 Units (ref <=20.0)

## 2016-01-23 LAB — HEPATITIS B SURFACE ANTIGEN: HEP B S AG: NEGATIVE

## 2016-01-24 LAB — ANTI-SMOOTH MUSCLE ANTIBODY, IGG

## 2016-01-25 LAB — ALPHA-1-ANTITRYPSIN: A-1 Antitrypsin, Ser: 174 mg/dL (ref 83–199)

## 2016-01-25 LAB — CERULOPLASMIN: CERULOPLASMIN: 52 mg/dL — AB (ref 18–36)

## 2016-01-28 LAB — NUCLEOTIDASE, 5', BLOOD: 5-Nucleotidase: 25 U/L — ABNORMAL HIGH (ref 0–10)

## 2016-02-01 ENCOUNTER — Ambulatory Visit (HOSPITAL_COMMUNITY)
Admission: RE | Admit: 2016-02-01 | Discharge: 2016-02-01 | Disposition: A | Discharge status: Home / Self Care | Payer: 59 | Source: Ambulatory Visit | Attending: Physician Assistant | Admitting: Physician Assistant

## 2016-02-01 DIAGNOSIS — R7989 Other specified abnormal findings of blood chemistry: Secondary | ICD-10-CM

## 2016-02-01 DIAGNOSIS — K824 Cholesterolosis of gallbladder: Secondary | ICD-10-CM | POA: Insufficient documentation

## 2016-02-01 DIAGNOSIS — N281 Cyst of kidney, acquired: Secondary | ICD-10-CM | POA: Diagnosis not present

## 2016-02-01 DIAGNOSIS — R945 Abnormal results of liver function studies: Secondary | ICD-10-CM

## 2016-02-05 ENCOUNTER — Other Ambulatory Visit: Payer: Self-pay

## 2016-02-05 DIAGNOSIS — R7989 Other specified abnormal findings of blood chemistry: Secondary | ICD-10-CM

## 2016-02-05 DIAGNOSIS — R945 Abnormal results of liver function studies: Secondary | ICD-10-CM

## 2016-03-10 ENCOUNTER — Encounter: Payer: 59 | Attending: Physical Medicine & Rehabilitation | Admitting: Physical Medicine & Rehabilitation

## 2016-03-10 ENCOUNTER — Encounter: Payer: Self-pay | Admitting: Physical Medicine & Rehabilitation

## 2016-03-10 VITALS — BP 113/80 | HR 81 | Resp 14

## 2016-03-10 DIAGNOSIS — C61 Malignant neoplasm of prostate: Secondary | ICD-10-CM | POA: Insufficient documentation

## 2016-03-10 DIAGNOSIS — Z79899 Other long term (current) drug therapy: Secondary | ICD-10-CM | POA: Insufficient documentation

## 2016-03-10 DIAGNOSIS — D860 Sarcoidosis of lung: Secondary | ICD-10-CM | POA: Diagnosis not present

## 2016-03-10 DIAGNOSIS — D472 Monoclonal gammopathy: Secondary | ICD-10-CM | POA: Diagnosis not present

## 2016-03-10 DIAGNOSIS — M5136 Other intervertebral disc degeneration, lumbar region: Secondary | ICD-10-CM | POA: Insufficient documentation

## 2016-03-10 DIAGNOSIS — G609 Hereditary and idiopathic neuropathy, unspecified: Secondary | ICD-10-CM | POA: Diagnosis not present

## 2016-03-10 DIAGNOSIS — M519 Unspecified thoracic, thoracolumbar and lumbosacral intervertebral disc disorder: Secondary | ICD-10-CM | POA: Insufficient documentation

## 2016-03-10 DIAGNOSIS — I10 Essential (primary) hypertension: Secondary | ICD-10-CM | POA: Insufficient documentation

## 2016-03-10 DIAGNOSIS — R351 Nocturia: Secondary | ICD-10-CM | POA: Diagnosis not present

## 2016-03-10 MED ORDER — OXYCODONE HCL 10 MG PO TABS: 10.0000 mg | ORAL_TABLET | Freq: Every evening | ORAL | 0 refills | Status: DC | PRN

## 2016-03-10 NOTE — Patient Instructions (Signed)
PLEASE CALL ME WITH ANY PROBLEMS OR QUESTIONS ([phone])  

## 2016-03-10 NOTE — Progress Notes (Signed)
Subjective:    Patient ID: Martin Murphy, male    DOB: Jul 27, 1965, 51 y.o.   MRN: [id number]  HPI   Martin Murphy is here in follow up of his chronic pain. He continues to work full time. He hasn't had any falls or mishaps over the winter thus far. He saw Dr. Maryjean Ka for the sympathetic ganglion block without success.   He continues to use oycodone for pain control which provides some relief but makes him sleepy.   Pain Inventory Average Pain 4 Pain Right Now 4 My pain is tingling  In the last 24 hours, has pain interfered with the following? General activity 3 Relation with others 3 Enjoyment of life 4 What TIME of day is your pain at its worst? night Sleep (in general) Poor  Pain is worse with: walking and standing Pain improves with: rest and heat/ice Relief from Meds: 5  Mobility walk without assistance how many minutes can you walk? 30 ability to climb steps?  yes do you drive?  yes Do you have any goals in this area?  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  no  Physicians involved in your care Any changes since last visit?  no   Family History  Problem Relation Age of Onset  . Hypertension Brother   . Prostate cancer Maternal Uncle     1 had surgery, 1 had seeds  . Cancer Maternal Grandfather     prostate  . Diabetes Brother   . Colon cancer Neg Hx   . Esophageal cancer Neg Hx   . Stomach cancer Neg Hx   . Rectal cancer Neg Hx    Social History   Social History  . Marital status: Divorced    Spouse name: N/A  . Number of children: N/A  . Years of education: N/A   Social History Main Topics  . Smoking status: Never Smoker  . Smokeless tobacco: Never Used  . Alcohol use No  . Drug use: No  . Sexual activity: Yes   Other Topics Concern  . None   Social History Narrative  . None   Past Surgical History:  Procedure  Laterality Date  . BRONCHOSCOPY  1990's   w/  biopsy's  . BUNIONECTOMY    . FOOT SURGERY     3 surgeries total  . INGUINAL HERNIA REPAIR Bilateral right  02-09-2012/   left  1990's  . PROSTATE BIOPSY  12/20/12   gleason 6  . PROSTATE BIOPSY  06/07/13   gleason 6, vol 36 gms  . RADIOACTIVE SEED IMPLANT N/A 10/20/2013   Procedure: RADIOACTIVE SEED IMPLANT;  Surgeon: Bernestine Amass, MD;  Location: [facility];  Service: Urology;  Laterality: N/A;   Past Medical History:  Diagnosis Date  . At risk for sleep apnea    STOP-BANG=5    SENT TO PCP 10-13-2013  . Bunion    bilateral great toe  . 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 Hypertension   . Nocturia   . Prostate cancer ([place]) DX  12/20/2012   biopsies x 2, Gleason 3+3=6  vol 36  . Sarcoidosis of lung ([place])    per lung bx 1990's   BP 113/80   Pulse 81   Resp 14   SpO2 95%   Opioid Risk Score:   Fall Risk Score:  `1  Depression screen PHQ  2/9  Depression screen Martin Murphy 2/9 08/20/2015 07/16/2015 05/25/2015 10/27/2014  Decreased Interest 1 3 1  0  Down, Depressed, Hopeless 0 2 0 0  PHQ - 2 Score 1 5 1  0  Altered sleeping - 2 - -  Tired, decreased energy - 2 - -  Change in appetite - 1 - -  Feeling bad or failure about yourself  - 2 - -  Trouble concentrating - 1 - -  Moving slowly or fidgety/restless - 1 - -  Suicidal thoughts - 1 - -  PHQ-9 Score - 15 - -      Review of Systems  Constitutional: Negative.   HENT: Negative.   Eyes: Negative.   Respiratory: Negative.   Cardiovascular: Negative.   Gastrointestinal: Positive for constipation.  Endocrine: Negative.   Genitourinary: Negative.   Musculoskeletal: Negative.   Skin: Negative.   Allergic/Immunologic: Negative.   Neurological: Negative.   Hematological: Negative.   Psychiatric/Behavioral: Negative.   All other systems reviewed and are negative.      Objective:   Physical Exam  General: Alert and oriented x 3, No  apparent distress  HEENT:Head is normocephalic, atraumatic, PERRLA, EOMI, sclera anicteric, oral mucosa pink and moist, dentition intact, ext ear canals clear,  Neck:Supple without JVD or lymphadenopathy  Heart:RRR  Chest:CTA b  Abdomen:Soft, non-tender, non-distended, bowel sounds positive.  Extremities:No clubbing, cyanosis, or edema. Pulses are 2+  Skin:Clean and intact without signs of breakdown. Scars on feet from bunionectomies.  Neuro:Pt is cognitively appropriate with normal insight, memory, and awareness. Cranial nerves 2-12 are intact. Sensory exam is normal in the UE's. He demonstrates decreased LT/pain sense/vibration below the mid calf---left seemed more affected than right--no change.. Reflexes are 2+ in all 4's.  Motor function is grossly 5/5. Excellent balance   Musculoskeletal:Full ROM, No pain with AROM or PROM in the neck, and upper/lower extremities. Posture appropriate. Lumbar tenderness with flexion . Extension/rotation/side bending/facet maneuvers/SLR did not provoke pain. Mild pain in the low back  Continuedantalgia with weightbearing on either leg/foot.  Psych:Pt's affect is appropriate.    Assessment & Plan:  1. 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 Degenerative lumbar disc disease  3. Prostate cancer s/p seed implants  4. MGUS    Plan:  1. Continue nortriptyline at 25 mg qhs.  2. Continue with psychological counseling as directed 3. Stop compounded cream 4. Continue cymbalta  5. Will not pursue further sympathetic ganglion blocks due to lack efficacy.   6. Continue oxycodone 10mg  at night for sleep/neuropathic pain  7. Continue work at 8 hours per day at [employer] Murphy physical duty. He seems to be accommodating well. 8. 10 minutes of face to face patient care time were spent during this visit. All questions were encouraged and answered. Return in about 20months  .

## 2016-03-26 ENCOUNTER — Telehealth: Payer: Self-pay | Admitting: Gastroenterology

## 2016-03-26 ENCOUNTER — Ambulatory Visit: Payer: 59 | Admitting: Gastroenterology

## 2016-03-26 NOTE — Telephone Encounter (Signed)
Per Dr Havery Moros do not charge pt for cancellation

## 2016-04-30 ENCOUNTER — Ambulatory Visit: Payer: 59 | Admitting: Family Medicine

## 2016-05-05 ENCOUNTER — Encounter: Payer: 59 | Attending: Physical Medicine & Rehabilitation | Admitting: Registered Nurse

## 2016-05-05 ENCOUNTER — Telehealth: Payer: Self-pay

## 2016-05-05 ENCOUNTER — Encounter: Payer: Self-pay | Admitting: Registered Nurse

## 2016-05-05 ENCOUNTER — Other Ambulatory Visit (INDEPENDENT_AMBULATORY_CARE_PROVIDER_SITE_OTHER): Payer: 59

## 2016-05-05 VITALS — BP 124/81 | HR 82 | Resp 14

## 2016-05-05 DIAGNOSIS — C61 Malignant neoplasm of prostate: Secondary | ICD-10-CM | POA: Insufficient documentation

## 2016-05-05 DIAGNOSIS — R7989 Other specified abnormal findings of blood chemistry: Secondary | ICD-10-CM

## 2016-05-05 DIAGNOSIS — D472 Monoclonal gammopathy: Secondary | ICD-10-CM | POA: Diagnosis not present

## 2016-05-05 DIAGNOSIS — D860 Sarcoidosis of lung: Secondary | ICD-10-CM | POA: Insufficient documentation

## 2016-05-05 DIAGNOSIS — Z5181 Encounter for therapeutic drug level monitoring: Secondary | ICD-10-CM

## 2016-05-05 DIAGNOSIS — Z79899 Other long term (current) drug therapy: Secondary | ICD-10-CM

## 2016-05-05 DIAGNOSIS — R351 Nocturia: Secondary | ICD-10-CM | POA: Insufficient documentation

## 2016-05-05 DIAGNOSIS — M519 Unspecified thoracic, thoracolumbar and lumbosacral intervertebral disc disorder: Secondary | ICD-10-CM | POA: Insufficient documentation

## 2016-05-05 DIAGNOSIS — I10 Essential (primary) hypertension: Secondary | ICD-10-CM | POA: Diagnosis not present

## 2016-05-05 DIAGNOSIS — G609 Hereditary and idiopathic neuropathy, unspecified: Secondary | ICD-10-CM | POA: Diagnosis not present

## 2016-05-05 DIAGNOSIS — R945 Abnormal results of liver function studies: Secondary | ICD-10-CM

## 2016-05-05 DIAGNOSIS — M5136 Other intervertebral disc degeneration, lumbar region: Secondary | ICD-10-CM | POA: Insufficient documentation

## 2016-05-05 DIAGNOSIS — G894 Chronic pain syndrome: Secondary | ICD-10-CM

## 2016-05-05 LAB — HEPATIC FUNCTION PANEL
ALBUMIN: 4 g/dL (ref 3.5–5.2)
ALK PHOS: 173 U/L — AB (ref 39–117)
ALT: 45 U/L (ref 0–53)
AST: 33 U/L (ref 0–37)
BILIRUBIN TOTAL: 0.5 mg/dL (ref 0.2–1.2)
Bilirubin, Direct: 0.1 mg/dL (ref 0.0–0.3)
Total Protein: 7.6 g/dL (ref 6.0–8.3)

## 2016-05-05 MED ORDER — OXYCODONE HCL 10 MG PO TABS
10.0000 mg | ORAL_TABLET | Freq: Every evening | ORAL | 0 refills | Status: DC | PRN
Start: 2016-05-05 — End: 2016-06-25

## 2016-05-05 MED ORDER — OXYCODONE HCL 10 MG PO TABS: 10.0000 mg | ORAL_TABLET | Freq: Every evening | ORAL | 0 refills | Status: DC | PRN

## 2016-05-05 NOTE — Telephone Encounter (Signed)
Spoke with the patient. He expresses understanding to come for his LFT labs. He asks if he can come this week. Assured him he could come this week. No appointment needed.

## 2016-05-05 NOTE — Progress Notes (Signed)
Subjective:    Patient ID: Martin Murphy, male    DOB: Jul 22, 1965, 51 y.o.   MRN: [id number]  HPI: [name] is a 51 year old male who returns for follow up appointment and medication refill. He states his pain is located in his lower back and bilateral feet with tingling and burning. He rates his pain 4. His current exercise regime is walking and performing stretching exercises.  Martin Murphy want to try to wean off the Oxycodone, only one script given, he was instructed to call office with any questions and concerns. He verbalizes understanding.   Pain Inventory Average Pain 5 Pain Right Now 4 My pain is burning, stabbing and aching  In the last 24 hours, has pain interfered with the following? General activity 7 Relation with others 6 Enjoyment of life 5 What TIME of day is your pain at its worst? night Sleep (in general) Poor  Pain is worse with: bending, standing and some activites Pain improves with: rest, heat/ice and medication Relief from Meds: 7  Mobility walk without assistance how many minutes can you walk? 40 ability to climb steps?  yes do you drive?  yes  Function employed # of hrs/week 40  Neuro/Psych bladder control problems numbness tingling  Prior Studies Any changes since last visit?  no  Physicians involved in your care Any changes since last visit?  no   Family History  Problem Relation Age of Onset  . Hypertension Brother   . Prostate cancer Maternal Uncle     1 had surgery, 1 had seeds  . Cancer Maternal Grandfather     prostate  . Diabetes Brother   . Colon cancer Neg Hx   . Esophageal cancer Neg Hx   . Stomach cancer Neg Hx   . Rectal cancer Neg Hx    Social History   Social History  . Marital status: Divorced    Spouse name: N/A  . Number of children: N/A  . Years of education: N/A   Social History Main Topics  . Smoking status: Never Smoker  . Smokeless tobacco: Never Used  . Alcohol use No  . Drug use: No  .  Sexual activity: Yes   Other Topics Concern  . None   Social History Narrative  . None   Past Surgical History:  Procedure Laterality Date  . BRONCHOSCOPY  1990's   w/  biopsy's  . BUNIONECTOMY    . FOOT SURGERY     3 surgeries total  . INGUINAL HERNIA REPAIR Bilateral right  02-09-2012/   left  1990's  . PROSTATE BIOPSY  12/20/12   gleason 6  . PROSTATE BIOPSY  06/07/13   gleason 6, vol 36 gms  . RADIOACTIVE SEED IMPLANT N/A 10/20/2013   Procedure: RADIOACTIVE SEED IMPLANT;  Surgeon: Bernestine Amass, MD;  Location: [facility];  Service: Urology;  Laterality: N/A;   Past Medical History:  Diagnosis Date  . At risk for sleep apnea    STOP-BANG=5    SENT TO PCP 10-13-2013  . Bunion    bilateral great toe  . 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 Hypertension   . Nocturia   . Prostate cancer ([place]) DX  12/20/2012   biopsies x 2, Gleason 3+3=6  vol 36  . Sarcoidosis of lung ([place])    per lung bx 1990's   BP 124/81   Pulse 82   Resp 14   SpO2 94%   Opioid  Risk Score:   Fall Risk Score:  `1  Depression screen PHQ 2/9  Depression screen [facility] 2/9 08/20/2015 07/16/2015 05/25/2015 10/27/2014  Decreased Interest 1 3 1  0  Down, Depressed, Hopeless 0 2 0 0  PHQ - 2 Score 1 5 1  0  Altered sleeping - 2 - -  Tired, decreased energy - 2 - -  Change in appetite - 1 - -  Feeling bad or failure about yourself  - 2 - -  Trouble concentrating - 1 - -  Moving slowly or fidgety/restless - 1 - -  Suicidal thoughts - 1 - -  PHQ-9 Score - 15 - -    Review of Systems  Constitutional: Negative.   HENT: Negative.   Eyes: Negative.   Respiratory: Negative.   Cardiovascular: Negative.   Gastrointestinal: Negative.   Endocrine: Negative.   Genitourinary: Negative.   Musculoskeletal: Negative.   Skin: Negative.   Allergic/Immunologic: Negative.   Neurological: Positive for numbness.       Tingling  Hematological: Negative.   Psychiatric/Behavioral:  Negative.   All other systems reviewed and are negative.      Objective:   Physical Exam  Constitutional: He is oriented to person, place, and time. He appears well-developed and well-nourished.  HENT:  Head: Normocephalic and atraumatic.  Neck: Normal range of motion. Neck supple.  Cardiovascular: Normal rate and regular rhythm.   Pulmonary/Chest: Effort normal and breath sounds normal.  Musculoskeletal:  Normal Muscle Bulk and Muscle Testing Reveals:  Upper Extremities: Full ROM and Muscle Strength 5/5 Back without spinal tenderness noted Lower Extremities: Full ROM and Muscle Strength 5/5 Arises from Table with ease Narrow Based Gait  Neurological: He is alert and oriented to person, place, and time.  Skin: Skin is warm and dry.  Psychiatric: He has a normal mood and affect.  Nursing note and vitals reviewed.         Assessment & Plan:  1. Idiopathic axonal peripheral neuropathy: Refilled: Oxycodone 10 mg one tablet at HS.  We will continue the opioid monitoring program, this consists of regular clinic visits, examinations, urine drug screen, pill counts as well as use of [place] Controlled Substance Reporting System.  2. Degenerative lumbar disc disease: Continue  Current medication Regime and HEP.  3. Prostate cancer s/p seed implants  4. MGUS: Continue to Monitor  15  minutes of face to face patient care time was spent during this visit. All questions were encouraged and answered.   F/U in  59months.

## 2016-05-06 ENCOUNTER — Telehealth: Payer: Self-pay | Admitting: Registered Nurse

## 2016-05-06 NOTE — Telephone Encounter (Signed)
On 05/06/2016 the  [place] was reviewed no conflict was seen on the [place] with multiple prescribers. Martin Murphy has a signed narcotic contract with our office. If there were any discrepancies this would have been reported to his physician.

## 2016-06-13 ENCOUNTER — Ambulatory Visit: Payer: 59 | Admitting: Gastroenterology

## 2016-06-18 ENCOUNTER — Other Ambulatory Visit (HOSPITAL_BASED_OUTPATIENT_CLINIC_OR_DEPARTMENT_OTHER): Payer: 59

## 2016-06-18 DIAGNOSIS — D472 Monoclonal gammopathy: Secondary | ICD-10-CM

## 2016-06-18 LAB — COMPREHENSIVE METABOLIC PANEL
ALBUMIN: 3.7 g/dL (ref 3.5–5.0)
ALT: 58 U/L — ABNORMAL HIGH (ref 0–55)
AST: 43 U/L — AB (ref 5–34)
Alkaline Phosphatase: 172 U/L — ABNORMAL HIGH (ref 40–150)
Anion Gap: 10 mEq/L (ref 3–11)
BUN: 13.5 mg/dL (ref 7.0–26.0)
CHLORIDE: 105 meq/L (ref 98–109)
CO2: 27 mEq/L (ref 22–29)
Calcium: 9.5 mg/dL (ref 8.4–10.4)
Creatinine: 0.9 mg/dL (ref 0.7–1.3)
EGFR: >90 mL/min/{1.73_m2} (ref >90)
GLUCOSE: 94 mg/dL (ref 70–140)
POTASSIUM: 3.9 meq/L (ref 3.5–5.1)
SODIUM: 142 meq/L (ref 136–145)
Total Bilirubin: 0.62 mg/dL (ref 0.20–1.20)
Total Protein: 7.7 g/dL (ref 6.4–8.3)

## 2016-06-18 LAB — CBC WITH DIFFERENTIAL/PLATELET
BASO%: 0.4 % (ref 0.0–2.0)
BASOS ABS: 0 10*3/uL (ref 0.0–0.1)
EOS%: 2.2 % (ref 0.0–7.0)
Eosinophils Absolute: 0.1 10*3/uL (ref 0.0–0.5)
HCT: 39.6 % (ref 38.4–49.9)
HEMOGLOBIN: 13.2 g/dL (ref 13.0–17.1)
LYMPH%: 27.3 % (ref 14.0–49.0)
MCH: 28.2 pg (ref 27.2–33.4)
MCHC: 33.3 g/dL (ref 32.0–36.0)
MCV: 84.6 fL (ref 79.3–98.0)
MONO#: 0.2 10*3/uL (ref 0.1–0.9)
MONO%: 6.2 % (ref 0.0–14.0)
NEUT#: 2.4 10*3/uL (ref 1.5–6.5)
NEUT%: 63.9 % (ref 39.0–75.0)
Platelets: 196 10*3/uL (ref 140–400)
RBC: 4.68 10*6/uL (ref 4.20–5.82)
RDW: 13.9 % (ref 11.0–14.6)
WBC: 3.7 10*3/uL — ABNORMAL LOW (ref 4.0–10.3)
lymph#: 1 10*3/uL (ref 0.9–3.3)

## 2016-06-18 LAB — LACTATE DEHYDROGENASE: LDH: 136 U/L (ref 125–245)

## 2016-06-19 LAB — IGG, IGA, IGM
IgA, Qn, Serum: 567 mg/dL — ABNORMAL HIGH (ref 90–386)
IgM, Qn, Serum: 30 mg/dL (ref 20–172)

## 2016-06-19 LAB — BETA 2 MICROGLOBULIN, SERUM: Beta-2: 2.1 mg/L (ref 0.6–2.4)

## 2016-06-19 LAB — KAPPA/LAMBDA LIGHT CHAINS
IG KAPPA FREE LIGHT CHAIN: 18.7 mg/L (ref 3.3–19.4)
IG LAMBDA FREE LIGHT CHAIN: 16.5 mg/L (ref 5.7–26.3)
KAPPA/LAMBDA FLC RATIO: 1.13 (ref 0.26–1.65)

## 2016-06-25 ENCOUNTER — Ambulatory Visit (HOSPITAL_BASED_OUTPATIENT_CLINIC_OR_DEPARTMENT_OTHER): Payer: 59 | Admitting: Internal Medicine

## 2016-06-25 ENCOUNTER — Encounter: Payer: Self-pay | Admitting: Internal Medicine

## 2016-06-25 VITALS — BP 126/82 | HR 76 | Temp 97.8°F | Resp 18 | Ht 71.25 in | Wt 200.8 lb

## 2016-06-25 DIAGNOSIS — D472 Monoclonal gammopathy: Secondary | ICD-10-CM | POA: Diagnosis not present

## 2016-06-25 NOTE — Progress Notes (Signed)
[place] [phone]   [phone]) [phone]  OFFICE PROGRESS NOTE  Martin Dingwall, NP-C [place] [address]  DIAGNOSIS: Monoclonal gammopathy of undetermined significance  PRIOR THERAPY: None  CURRENT THERAPY: Observation.   INTERVAL HISTORY: Martin Murphy 51 y.o. male returns to the clinic for annual follow-up visit. The patient is feeling fine today with no specific complaints. The patient has been observation for the monoclonal gammopathy for several years. He had having any chest pain, shortness breath, cough or hemoptysis. He denied having any weight loss or night sweats. 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 He had repeat myeloma panel performed recently and he is here for evaluation and discussion of his lab results.   MEDICAL HISTORY: Past Medical History:  Diagnosis Date  . At risk for sleep apnea    STOP-BANG=5    SENT TO PCP 10-13-2013  . Bunion    bilateral great toe  . 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 Hypertension   . Nocturia   . Prostate cancer ([place]) DX  12/20/2012   biopsies x 2, Gleason 3+3=6  vol 36  . 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  MEDICATIONS:  Current Outpatient Prescriptions  Medication Sig Dispense Refill  . Ascorbic Acid (VITAMIN C PO) Take by mouth daily. Reported on 05/25/2015    . hydrochlorothiazide (HYDRODIURIL) 25 MG tablet Take 1 tablet (25 mg total) by mouth daily. 90 tablet 1  . MAGNESIUM PO Take by mouth daily. Reported on 05/25/2015    . Oxycodone HCl 10 MG TABS Take 1 tablet (10 mg total) by mouth at bedtime as needed (pain). 30 tablet 0  . Oxycodone HCl 10 MG TABS Take 1 tablet (10 mg total) by mouth at bedtime as needed (pain). 30 tablet 0  . tamsulosin (FLOMAX) 0.4 MG CAPS capsule Take 0.4 mg by mouth. Reported on 05/25/2015    .  vitamin B-12 (CYANOCOBALAMIN) 1000 MCG tablet Take 1,000 mcg by mouth daily. Reported on 05/25/2015     No current facility-administered medications for this visit.     SURGICAL HISTORY:  Past Surgical History:  Procedure Laterality Date  . BRONCHOSCOPY  1990's   w/  biopsy's  . BUNIONECTOMY    . FOOT SURGERY     3 surgeries total  . INGUINAL HERNIA REPAIR Bilateral right  02-09-2012/   left  1990's  . PROSTATE BIOPSY  12/20/12   gleason 6  . PROSTATE BIOPSY  06/07/13   gleason 6, vol 36 gms  . RADIOACTIVE SEED IMPLANT N/A 10/20/2013   Procedure: RADIOACTIVE SEED IMPLANT;  Surgeon: Bernestine Amass, MD;  Location: [facility];  Service: Urology;  Laterality: N/A;    REVIEW OF SYSTEMS:  A comprehensive review of systems was negative except for: Musculoskeletal: positive for back pain   PHYSICAL EXAMINATION: General appearance: alert, cooperative and no distress Head: Normocephalic, without obvious abnormality, atraumatic Neck: no adenopathy, no JVD, supple, symmetrical, trachea midline and thyroid not enlarged, symmetric, no tenderness/mass/nodules Lymph nodes: Cervical, supraclavicular, and axillary nodes normal. Resp: clear to auscultation bilaterally Back: symmetric, no curvature. ROM normal. No CVA tenderness. Cardio: regular rate and rhythm, S1, S2 normal, no murmur, click, rub or gallop GI: soft, non-tender; bowel sounds normal; no masses,  no organomegaly Extremities: extremities normal, atraumatic, no cyanosis or  edema  ECOG PERFORMANCE STATUS: 0 - Asymptomatic  Blood pressure 126/82, pulse 76, temperature 97.8 F (36.6 C), temperature source Oral, resp. rate 18, height 5' 11.25" (1.81 m), weight 200 lb 12.8 oz (91.1 kg), SpO2 98 %.  LABORATORY DATA: Lab Results  Component Value Date   WBC 3.7 (L) 06/18/2016   HGB 13.2 06/18/2016   HCT 39.6 06/18/2016   MCV 84.6 06/18/2016   PLT 196 06/18/2016      Chemistry      Component Value Date/Time   NA 142  06/18/2016 0918   K 3.9 06/18/2016 0918   CL 103 01/21/2016 0832   CO2 27 06/18/2016 0918   BUN 13.5 06/18/2016 0918   CREATININE 0.9 06/18/2016 0918      Component Value Date/Time   CALCIUM 9.5 06/18/2016 0918   ALKPHOS 172 (H) 06/18/2016 0918   AST 43 (H) 06/18/2016 0918   ALT 58 (H) 06/18/2016 0918   BILITOT 0.62 06/18/2016 0918     Other lab results: Beta-2 microglobulin 2.1, IgG 1122, IgA 567 and IgM 30. Kappa light chain 18.7, free lambda light chain  16.5 with a kappa/lambda ratio of 1.13  RADIOGRAPHIC STUDIES: No results found.  ASSESSMENT AND PLAN:  This is a very pleasant 51 years old African-American male with questionable monoclonal gammopathy likely inflammatory in origin. The patient has been observation for several years. The recent myeloma panel showed no concerning findings. I discussed the lab result with the patient today. I recommended for him to continue on observation with routine follow-up visit by his primary care physician at this point. I don't see a need to see the patient at regular basis but I'll be happy to see him in the future if needed. He was advised to call if he has any concerning symptoms. The patient voices understanding of current disease status and treatment options and is in agreement with the current care plan. All questions were answered. The patient knows to call the clinic with any problems, questions or concerns. We can certainly see the patient much sooner if necessary. I spent 10 minutes counseling the patient face to face. The total time spent in the appointment was 15 minutes. Disclaimer: This note was dictated with voice recognition software. Similar sounding words can inadvertently be transcribed and may not be corrected upon review.

## 2016-06-27 ENCOUNTER — Telehealth: Payer: Self-pay | Admitting: Internal Medicine

## 2016-06-27 NOTE — Telephone Encounter (Signed)
Per 4/25 los - no follow up appt needed

## 2016-07-03 ENCOUNTER — Encounter: Payer: 59 | Attending: Physical Medicine & Rehabilitation | Admitting: Registered Nurse

## 2016-07-03 ENCOUNTER — Encounter: Payer: Self-pay | Admitting: Registered Nurse

## 2016-07-03 VITALS — BP 127/86 | HR 86

## 2016-07-03 DIAGNOSIS — C61 Malignant neoplasm of prostate: Secondary | ICD-10-CM | POA: Insufficient documentation

## 2016-07-03 DIAGNOSIS — D860 Sarcoidosis of lung: Secondary | ICD-10-CM | POA: Diagnosis not present

## 2016-07-03 DIAGNOSIS — Z5181 Encounter for therapeutic drug level monitoring: Secondary | ICD-10-CM

## 2016-07-03 DIAGNOSIS — M519 Unspecified thoracic, thoracolumbar and lumbosacral intervertebral disc disorder: Secondary | ICD-10-CM | POA: Diagnosis not present

## 2016-07-03 DIAGNOSIS — R351 Nocturia: Secondary | ICD-10-CM | POA: Insufficient documentation

## 2016-07-03 DIAGNOSIS — I10 Essential (primary) hypertension: Secondary | ICD-10-CM | POA: Insufficient documentation

## 2016-07-03 DIAGNOSIS — M5416 Radiculopathy, lumbar region: Secondary | ICD-10-CM

## 2016-07-03 DIAGNOSIS — M5136 Other intervertebral disc degeneration, lumbar region: Secondary | ICD-10-CM | POA: Diagnosis not present

## 2016-07-03 DIAGNOSIS — D472 Monoclonal gammopathy: Secondary | ICD-10-CM

## 2016-07-03 DIAGNOSIS — G609 Hereditary and idiopathic neuropathy, unspecified: Secondary | ICD-10-CM | POA: Insufficient documentation

## 2016-07-03 DIAGNOSIS — G894 Chronic pain syndrome: Secondary | ICD-10-CM | POA: Diagnosis not present

## 2016-07-03 DIAGNOSIS — Z79899 Other long term (current) drug therapy: Secondary | ICD-10-CM | POA: Insufficient documentation

## 2016-07-03 NOTE — Progress Notes (Signed)
Subjective:    Patient ID: Martin Murphy, male    DOB: Jun 14, 1965, 51 y.o.   MRN: [id number]  HPI: Martin Murphy is a 51 year old male who returns for follow up appointment and medication refill. He states his pain is located in his lower back radiating into his bilateral lower extremities and bilateral feet with tingling and burning. Martin Murphy states the gabapentin and Pamelor was ineffective. Lyrica caused increase daytime drowsiness. Martin Murphy, will speak with Dr. Naaman Plummer and give him a call. He verbalizes understanding.  He rates his pain 5. His current exercise regime is walking and performing stretching exercises.   His last UDS was on 01/02/2016, oxycodone not detected.  He's no longer taking the Oxycodone, last prescription was filled on 05/14/2016.   Pain Inventory Average Pain 6 Pain Right Now 5 My pain is sharp, burning, stabbing, tingling and aching  In the last 24 hours, has pain interfered with the following? General activity 7 Relation with others 6 Enjoyment of life 4 What TIME of day is your pain at its worst? night Sleep (in general) Poor  Pain is worse with: walking, standing and some activites Pain improves with: rest and medication Relief from Meds: na  Mobility walk without assistance how many minutes can you walk? 40 ability to climb steps?  yes do you drive?  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  no  Physicians involved in your care Any changes since last visit?  no   Family History  Problem Relation Age of Onset  . Hypertension Brother   . Prostate cancer Maternal Uncle     1 had surgery, 1 had seeds  . Cancer Maternal Grandfather     prostate  . Diabetes Brother   . Colon cancer Neg Hx   . Esophageal cancer Neg Hx   . Stomach cancer Neg Hx   . Rectal cancer Neg Hx    Social  History   Social History  . Marital status: Divorced    Spouse name: N/A  . Number of children: N/A  . Years of education: N/A   Social History Main Topics  . Smoking status: Never Smoker  . Smokeless tobacco: Never Used  . Alcohol use No  . Drug use: No  . Sexual activity: Yes   Other Topics Concern  . None   Social History Narrative  . None   Past Surgical History:  Procedure Laterality Date  . BRONCHOSCOPY  1990's   w/  biopsy's  . BUNIONECTOMY    . FOOT SURGERY     3 surgeries total  . INGUINAL HERNIA REPAIR Bilateral right  02-09-2012/   left  1990's  . PROSTATE BIOPSY  12/20/12   gleason 6  . PROSTATE BIOPSY  06/07/13   gleason 6, vol 36 gms  . RADIOACTIVE SEED IMPLANT N/A 10/20/2013   Procedure: RADIOACTIVE SEED IMPLANT;  Surgeon: Bernestine Amass, MD;  Location: [facility];  Service: Urology;  Laterality: N/A;   Past Medical History:  Diagnosis Date  . At risk for sleep apnea    STOP-BANG=5    SENT TO PCP 10-13-2013  . Bunion    bilateral great toe  . 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 Hypertension   . Nocturia   . Prostate cancer ([place]) DX  12/20/2012  biopsies x 2, Gleason 3+3=6  vol 36  . 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   BP 127/86   Pulse 86   SpO2 95%   Opioid Risk Score:   Fall Risk Score:  `1  Depression screen PHQ 2/9  Depression screen [facility] 2/9 07/03/2016 08/20/2015 07/16/2015 05/25/2015 10/27/2014  Decreased Interest 0 1 3 1  0  Down, Depressed, Hopeless 0 0 2 0 0  PHQ - 2 Score 0 1 5 1  0  Altered sleeping - - 2 - -  Tired, decreased energy - - 2 - -  Change in appetite - - 1 - -  Feeling bad or failure about yourself  - - 2 - -  Trouble concentrating - - 1 - -  Moving slowly or fidgety/restless - - 1 - -  Suicidal thoughts - - 1 - -  PHQ-9 Score - - 15 - -    Review of Systems  Constitutional: Negative.   HENT: Negative.   Eyes: Negative.   Respiratory: Negative.     Cardiovascular: Negative.   Gastrointestinal: Negative.   Endocrine: Negative.   Genitourinary: Negative.   Musculoskeletal: Negative.   Skin: Negative.   Allergic/Immunologic: Negative.   Neurological: Positive for numbness.       Tingling  Hematological: Negative.   Psychiatric/Behavioral: Negative.   All other systems reviewed and are negative.      Objective:   Physical Exam  Constitutional: He is oriented to person, place, and time. He appears well-developed and well-nourished.  HENT:  Head: Normocephalic and atraumatic.  Neck: Normal range of motion. Neck supple.  Cardiovascular: Normal rate and regular rhythm.   Pulmonary/Chest: Effort normal and breath sounds normal.  Musculoskeletal:  Normal Muscle Bulk and Muscle Testing Reveals: Upper Extremities: Full ROM and Muscle Strength 5/5 Back without spinal tenderness noted. Lower Extremities: Full ROM and Muscle Strength 5/5 Arises from Table with ease Narrow Based Gait  Neurological: He is alert and oriented to person, place, and time.  Skin: Skin is warm and dry.  Nursing note and vitals reviewed.         Assessment & Plan:  1. Idiopathic axonal peripheral neuropathy: Continue to Monitor. 07/03/2016 2. Degenerative lumbar disc disease: Continue  HEP and Continue to Monitor. 07/03/2016 3. Prostate cancer s/p seed implants . Continue to Monitor 4. 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 07/03/2016  20 minutes of face to face patient care time was spent during this visit. All questions were encouraged and answered.  F/U in  1 month.

## 2016-07-09 ENCOUNTER — Telehealth: Payer: Self-pay | Admitting: Registered Nurse

## 2016-07-09 DIAGNOSIS — M5136 Other intervertebral disc degeneration, lumbar region: Secondary | ICD-10-CM

## 2016-07-09 NOTE — Telephone Encounter (Signed)
Spoke with Dr. Naaman Plummer regarding [name] request concerning a Chiropractor. Dr. Naaman Plummer recommends Physical Therapy.  Placed a call to Martin Murphy he verbalizes understanding.

## 2016-07-14 ENCOUNTER — Encounter: Payer: Self-pay | Admitting: Medical

## 2016-07-14 ENCOUNTER — Ambulatory Visit (INDEPENDENT_AMBULATORY_CARE_PROVIDER_SITE_OTHER): Payer: Self-pay | Admitting: Medical

## 2016-07-14 VITALS — BP 110/82 | HR 73 | Ht 70.0 in | Wt 198.2 lb

## 2016-07-14 DIAGNOSIS — Z0289 Encounter for other administrative examinations: Secondary | ICD-10-CM

## 2016-07-14 DIAGNOSIS — M519 Unspecified thoracic, thoracolumbar and lumbosacral intervertebral disc disorder: Secondary | ICD-10-CM

## 2016-07-14 DIAGNOSIS — I10 Essential (primary) hypertension: Secondary | ICD-10-CM

## 2016-07-14 DIAGNOSIS — G609 Hereditary and idiopathic neuropathy, unspecified: Secondary | ICD-10-CM

## 2016-07-14 DIAGNOSIS — M21619 Bunion of unspecified foot: Secondary | ICD-10-CM

## 2016-07-14 NOTE — Progress Notes (Signed)
Commercial Driver Medical Examination   Martin Murphy is a 51 y.o. 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  Today [birth year] his birthday.  Medical care team includes:  Mack Hook, NP - primary care  The patient reports no problems.  He is on BP medication, controlled.   Has low back pain, pain in left foot, but not debilitating.   Has remote hx/o sarcoidosis without problems currently.  Has hx/o prostate cancer, in remission.  Review of Systems A comprehensive review of systems was reviewed and noted as below:  Eye: - corrective lenses, -lasik surgery or other eye surgery, -glaucoma, -cataracts, -macular degeneration, -monocular vision, -medication for eye condition, -blurred vision,   Ears: -hearing problems, - hearing aids, -ear pain, -ear drainage, -ear fullness, -tinnitus, -recurrent ear infection, -previous ear surgery, - vertigo, -meniere's disease  Endocrine: -polydipsia, -polyuria, -weight loss, -fainting, -dizziness, - altered or loss of consciousness, -hypoglycemia  Cardiovascular: -heart disease, -CHF, -heart attack, -cardiac stents, -bypass surgery, -other heart surgery, -hypertension, -blood clots, -pacemaker, -medications for heart condition, -chest pain, -SOB, -palpitations, -fainting, -dizziness, -dyspnea  Respiratory: -asthma, -COPD, other lung disease, -smoker, -chest tightness, - wheezing, -snoring, -daytime sleepiness, -sleep apnea or uses CPAP, -narcolepsy, -sleeping disorder  Allergy: -uncontrollable sneezing or allergy symptoms  Musculoskeletal: -missing body parts, -muscle disease, -bone disease, -spine injury, +low back pain, -medication for joints, bones, muscles or pain, -physical limitations, -joint pain, -neck pain, -limitations of neck ROM, -back surgery,  orthopedic surgery, -rheumatologic condition, -gout  Neurologic: +neurologic disease, -dementia, -seizures, -parkinson's, -tremor, -memory problems, -weakness, -numbness, -tingling, -medication for neurologic condition, -medications for sleep condition  Gastric: -abdominal pain, -chronic diarrhea or IBS, -uncontrollable nausea  Kidney/Renal: -hematuria, -dialysis, kidney disease, polycystic kidney disease  Psychiatric: -homicidal thoughts, -suicidal thoughts, -prior suicide attempts, -get into fights/hurting others, -memory or concentration problems, -delusions, -hallucinations, -hospitalization for mental health problem, -depression, -anxiety, -bipolar  Drug use: - none   Reviewed their medical, surgical, family, social, medication, and allergy history and updated chart as appropriate.      Objective:   Physical Exam  BP 110/82   Pulse 73   Ht 5\' 10"  (1.778 m)   Wt 198 lb 3.2 oz (89.9 kg)   SpO2 98%   BMI 28.44 kg/m   General appearance: alert, no distress, WD/WN, AA male Skin: bunion surgical scars bilat dorsal great toes HEENT: normocephalic, conjunctiva/corneas normal, sclerae anicteric, PERRLA, EOMi, nares patent, no discharge or erythema, pharynx normal Oral cavity: MMM, tongue normal, teeth in good repair Neck: supple, no lymphadenopathy, no thyromegaly, no masses, normal ROM, no bruits Chest: non tender, normal shape and expansion Heart: RRR, normal S1, S2, no murmurs Lungs: CTA bilaterally, no wheezes, rhonchi, or rales Abdomen: +bs, soft, non tender, non distended, no masses, no hepatomegaly, no splenomegaly, no bruits Back: non tender, normal ROM, no scoliosis Musculoskeletal: upper extremities non tender, no obvious deformity, normal ROM throughout, lower extremities non tender, no obvious deformity, normal ROM throughout Extremities: no edema, no cyanosis, no clubbing Pulses: 2+ symmetric, upper and lower extremities, normal cap refill Neurological: alert,  oriented x 3, CN2-12 intact, strength normal upper extremities and lower extremities, sensation normal throughout, DTRs 2+ throughout, no cerebellar signs, gait normal Psychiatric: normal affect, behavior normal, pleasant  GU: normal male external  genitalia, nontender, no masses, no hernia, no lymphadenopathy Rectal: deferred    Assessment:   Encounter Diagnoses  Name Primary?  . Health examination of defined subpopulation Yes  . Essential hypertension   . Hereditary and idiopathic peripheral neuropathy   . Bunion of great toe   . Lumbar disc disease      Plan:    HTN - compliant with medication, controlled His neuropathy and foot pain and back pain are not debilitating, no disqualifying limitations completed forms for 2 year certificate.

## 2016-07-16 ENCOUNTER — Other Ambulatory Visit: Payer: 59

## 2016-07-17 ENCOUNTER — Ambulatory Visit: Payer: 59 | Attending: Registered Nurse | Admitting: Physical Therapy

## 2016-07-17 ENCOUNTER — Encounter: Payer: Self-pay | Admitting: Physical Therapy

## 2016-07-17 DIAGNOSIS — M62830 Muscle spasm of back: Secondary | ICD-10-CM | POA: Diagnosis present

## 2016-07-17 DIAGNOSIS — M545 Low back pain: Secondary | ICD-10-CM | POA: Insufficient documentation

## 2016-07-17 DIAGNOSIS — M6281 Muscle weakness (generalized): Secondary | ICD-10-CM

## 2016-07-17 DIAGNOSIS — G8929 Other chronic pain: Secondary | ICD-10-CM | POA: Diagnosis present

## 2016-07-18 ENCOUNTER — Encounter: Payer: Self-pay | Admitting: Physical Therapy

## 2016-07-18 NOTE — Therapy (Signed)
[place], [place], [address] Phone: [phone]   Fax:  [phone]  Physical Therapy Treatment  Patient Details  Name: Martin Murphy MRN: [id number] Date of Birth: 1965/09/09 Referring Provider: Danella Sensing NP   Encounter Date: 07/17/2016      PT End of Session - 07/17/16 1656    Visit Number 1   Number of Visits 16   Date for PT Re-Evaluation 09/11/16   Authorization Type UHC    PT Start Time 9702   PT Stop Time 1630   PT Time Calculation (min) 50 min   Activity Tolerance Patient tolerated treatment well   Behavior During Therapy [facility] for tasks assessed/performed      Past Medical History:  Diagnosis Date  . At risk for sleep apnea    STOP-BANG=5    SENT TO PCP 10-13-2013  . Bunion    bilateral great toe  . 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 Hypertension   . Nocturia   . Prostate cancer ([place]) DX  12/20/2012   biopsies x 2, Gleason 3+3=6  vol 36  . 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 BRONCHOSCOPY  1990's   w/  biopsy's  . BUNIONECTOMY    . FOOT SURGERY     3 surgeries total  . INGUINAL HERNIA REPAIR Bilateral right  02-09-2012/   left  1990's  . PROSTATE BIOPSY  12/20/12   gleason 6  . PROSTATE BIOPSY  06/07/13   gleason 6, vol 36 gms  . RADIOACTIVE SEED IMPLANT N/A 10/20/2013   Procedure: RADIOACTIVE SEED IMPLANT;  Surgeon: Bernestine Amass, MD;  Location: [facility];  Service: Urology;  Laterality: N/A;    There were no vitals filed for this visit.      Subjective Assessment - 07/17/16 1540    Subjective Patient reports in 2015 he began having pain in his lower back and into his feet. He had surgery on both feet without an improvement in pain.    Pertinent History Diagnosed peripheral neuropathy    Limitations Walking;Standing;House hold activities   How long can you  stand comfortably? < 20 minutes. Has difficulty standing to cook    How long can you walk comfortably? Difficulty walking long distances    Diagnostic tests MRI: 2012 Small L5-S1 disc protrusion. No other imaging taken since that point    Patient Stated Goals To have less pain in his back   Currently in Pain? Yes   Pain Score 7    Pain Location Back   Pain Orientation Right;Left   Pain Descriptors / Indicators Aching   Pain Type Chronic pain   Pain Onset More than a month ago   Pain Frequency Constant   Aggravating Factors  standing, walking, work tasks    Pain Relieving Factors rest, stretching    Effect of Pain on Daily Activities difficulty perfroming ADL;s             [facility] PT Assessment - 07/18/16 0001      Assessment   Medical Diagnosis Low back pain; Bilateral foot pain    Referring Provider Danella Sensing NP    Onset Date/Surgical Date --  Has been having pain for several years    Hand Dominance Right   Next MD Visit June 6th 2018    Prior Therapy Went to a Art therapist  Precautions   Precautions None     Restrictions   Weight Bearing Restrictions No     Balance Screen   Has the patient fallen in the past 6 months No   Has the patient had a decrease in activity level because of a fear of falling?  No   Is the patient reluctant to leave their home because of a fear of falling?  No     Home Environment   Additional Comments Patient has to perfrom a step to gait pattern for his steps. he has 14 in his house.      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    Leisure Walking, Engineer, manufacturing systems   Overall Cognitive Status Within Functional Limits for tasks assessed   Attention Focused   Focused Attention Appears intact   Memory Appears intact   Awareness Appears intact   Problem Solving Appears intact     Observation/Other Assessments    Focus on Therapeutic Outcomes (FOTO)  44 % limitation      Sensation   Additional Comments Nu,mbness and tingling into bilateral feet. Bolth feet hurt equally bad. Increases at night with sleep.      Coordination   Gross Motor Movements are Fluid and Coordinated Yes   Fine Motor Movements are Fluid and Coordinated Yes     Posture/Postural Control   Posture Comments No major postural deficits      AROM   Lumbar Flexion 25 % with min pain    Lumbar Extension 75 % limitation with reproduction of pain    Lumbar - Right Side Bend pain with right side bedning    Lumbar - Left Side Bend No pain    Lumbar - Right Rotation Pain with right rotation    Lumbar - Left Rotation No pain      PROM   Overall PROM Comments Normal passive motion      Strength   Right/Left Hip Right;Left   Right Hip Flexion 4+/5   Right Hip ABduction 5/5   Right Hip ADduction 5/5   Left Hip Flexion 5/5   Left Hip ABduction 5/5   Left Hip ADduction 5/5   Right/Left Knee Right;Left   Right Knee Flexion 5/5   Right Knee Extension 5/5   Left Knee Flexion 5/5   Left Knee Extension 5/5   Right/Left Ankle Right;Left   Right Ankle Dorsiflexion 5/5   Right Ankle Plantar Flexion 5/5   Right Ankle Inversion 5/5   Right Ankle Eversion 5/5   Left Ankle Dorsiflexion 5/5   Left Ankle Plantar Flexion 5/5   Left Ankle Inversion 5/5   Left Ankle Eversion 5/5     Palpation   Palpation comment Spaming from right ILA into right paraspianls     Special Tests    Special Tests --  SLR test (-) bilateral      Ambulation/Gait   Gait Comments Normal gait                      [facility] Adult PT Treatment/Exercise - 07/18/16 0001      Lumbar Exercises: Stretches   Passive Hamstring Stretch Limitations 3x20sec hold     Quad Stretch Limitations thomas tratetch with ue  reach 2x30 sec each side    Piriformis Stretch Limitations 2x30sec bilateral      Lumbar Exercises:  Supine   Clam Limitations green 2x10 with  abdominal breathing    Bridge Limitations 2x10    Straight Leg Raises Limitations 2x10with abdominal breathing                 PT Education - 07/17/16 1655    Education provided Yes   Education Details improtance of symptom management; rationale behind core stabilization    Person(s) Educated Patient   Methods Explanation;Demonstration;Tactile cues;Verbal cues   Comprehension Verbalized understanding;Returned demonstration;Verbal cues required;Need further instruction          PT Short Term Goals - 07/17/16 1704      PT SHORT TERM GOAL #1   Title Patient will demsotrate a 35% improvement in lumbar extesnion    Time 4   Period Weeks   Status New     PT SHORT TERM GOAL #2   Title Patient will demsotrate a good core contraction with abdominal breathing without cuing    Time 4   Period Weeks   Status New     PT SHORT TERM GOAL #3   Title Patient will be independent with initail HEP for strengthening and stretching    Time 4   Period Weeks   Status New           PT Long Term Goals - 07/17/16 1705      PT LONG TERM GOAL #1   Title Patient will demsotrate full pain free lumbar extension in order to pull a hose at work    Time 6   Period Weeks   Status New     PT LONG TERM GOAL #2   Title Patient will be independent with core strengthening program in order to promote stability and decreased pain with work tasks going forward    Time 6   Period Weeks   Status New     PT LONG TERM GOAL #3   Title Patient will demsotrate a 34% distability on FOTO    Time 4   Period Weeks   Status New               Plan - 07/17/16 1657    Clinical Impression Statement Patient is a 51 year old male who presents with low back pain and bilateral foot pain. His pain increases when he is standing, walking, and poerfroming work tasks. He has increased pain with extension. He has to pull heavey hoses at work. Therapy educated him on core bracing and being concious of his  posture when performing work tasks. He hads decreased core strength and decreased PA mobility in his lower lumbar spine. He has spasming in bilateral lumbar spine. He would benefit from skilled therapy to improve core stability and spinal mobility. Therapy was unable to reporduce his foot pain but it hurt continuosly throughout the evaluation without change. He was seen for a low complexity evaluation.     Rehab Potential Good   PT Frequency 2x / week   PT Duration 8 weeks   PT Treatment/Interventions ADLs/Self Care Home Management;Cryotherapy;Electrical Stimulation;Iontophoresis 4mg /ml Dexamethasone;Gait training;Stair training;Moist Heat;Ultrasound;Therapeutic activities;Therapeutic exercise;Patient/family education;Manual techniques;Taping;Dry needling;Passive range of motion   PT Next Visit Plan consider dry needling; review stretches. Advance exercises. Consdier quadruped exercises. Review lifting technique; Consider pulling exercises.    PT Home Exercise Plan SLR, briding, Clam shells, thomas stretch, pirifromis stretch, lateral trunk rotation   Consulted and Agree with Plan of Care Patient      Patient will benefit from skilled therapeutic intervention in order to  improve the following deficits and impairments:  Decreased activity tolerance, Decreased mobility, Decreased strength, Increased fascial restricitons, Decreased range of motion, Increased muscle spasms, Improper body mechanics, Pain  Visit Diagnosis: Chronic bilateral low back pain without sciatica - Plan: PT plan of care cert/re-cert  Muscle spasm of back - Plan: PT plan of care cert/re-cert  Muscle weakness (generalized) - Plan: PT plan of care cert/re-cert     Problem List Patient Active Problem List   Diagnosis Date Noted  . High serum vitamin D 01/15/2016  . Elevated LDL cholesterol level 01/15/2016  . Lumbar disc disease 07/16/2015  . Chronic lumbar pain 07/02/2015  . Pain, foot, chronic 07/02/2015  . Vitamin D  deficiency 04/27/2015  . Low testosterone 04/27/2015  . Peripheral neuropathy 02/18/2015  . 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 Bunion of great toe 03/31/2014  . Liver dysfunction 12/28/2013  . HTN (hypertension) 10/28/2013  . Malignant neoplasm of prostate ([place]) 08/02/2013  . MGUS (monoclonal gammopathy of unknown significance) 05/31/2013    Carney Living PT DPT  07/18/2016, 11:44 AM  [facility] [address] [place], [place], [address] Phone: [phone]   Fax:  [phone]  Name: Martin Murphy MRN: [id number] Date of Birth: March 02, 1966

## 2016-07-21 ENCOUNTER — Encounter: Payer: Self-pay | Admitting: Family Medicine

## 2016-07-21 ENCOUNTER — Ambulatory Visit (INDEPENDENT_AMBULATORY_CARE_PROVIDER_SITE_OTHER): Payer: 59 | Admitting: Family Medicine

## 2016-07-21 VITALS — BP 120/80 | HR 85 | Wt 201.6 lb

## 2016-07-21 DIAGNOSIS — E7800 Pure hypercholesterolemia, unspecified: Secondary | ICD-10-CM | POA: Diagnosis not present

## 2016-07-21 DIAGNOSIS — R7989 Other specified abnormal findings of blood chemistry: Secondary | ICD-10-CM

## 2016-07-21 DIAGNOSIS — I10 Essential (primary) hypertension: Secondary | ICD-10-CM | POA: Diagnosis not present

## 2016-07-21 NOTE — Progress Notes (Signed)
   Subjective:    Patient ID: Martin Murphy, male    DOB: December 10, 1965, 51 y.o.   MRN: [id number]  HPI Chief Complaint  Patient presents with  . follow-up    follow-up. not fasting   He is here for a 6 month follow up on HTN, elevated vitamin D level and elevated LDL.  States he is taking vitamin D of unknown strength, vitamin B 12 and vitamin B6 also.   States his BP has been in goal range and would like to cut back on medication for HTN.   States he is going to PT again for back and foot pain. Doing well.  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     History of prostate cancer and he saw Dr. Risa Grill earlier today. States he was told that things are stable. He will see him again in 6 months. He was treated with seed implants 2 years ago. States he is going to start back taking Flomax.   LFTs have been elevated in the past and he has been worked up for this.   He reports working full time, getting married in August and is doing well.   Review of Systems Pertinent positives and negatives in the history of present illness.     Objective:   Physical Exam BP 120/80   Pulse 85   Wt 201 lb 9.6 oz (91.4 kg)   BMI 28.93 kg/m   Alert and in no distress. Pharyngeal area is normal. Neck is supple without adenopathy or thyromegaly. Cardiac exam shows a regular sinus rhythm without murmurs or gallops. Lungs are clear to auscultation. Extremities without edema, normal pulses.       Assessment & Plan:  Essential hypertension  High serum vitamin D - Plan: VITAMIN D 25 Hydroxy (Vit-D Deficiency, Fractures)  Elevated LDL cholesterol level - Plan: Lipid panel  Discussed that HTN cannot be cured but merely managed. He may cut back on HCTZ and take 12.5 mg if he chooses but discussed strict monitoring of his BP to see if he is staying within goal. He verbalized understanding.  He continues to take vitamin D. Will check his level and adjust dose as needed.  He is not fasting today. Will return  in the morning for labs and will check his cholesterol.  Follow up pending labs.

## 2016-07-21 NOTE — Patient Instructions (Addendum)
BP goal is <130/80   Return for fasting labs

## 2016-07-22 ENCOUNTER — Other Ambulatory Visit: Payer: 59

## 2016-07-22 DIAGNOSIS — R7989 Other specified abnormal findings of blood chemistry: Secondary | ICD-10-CM

## 2016-07-22 DIAGNOSIS — E7800 Pure hypercholesterolemia, unspecified: Secondary | ICD-10-CM

## 2016-07-22 LAB — LIPID PANEL
Cholesterol: 216 mg/dL — ABNORMAL HIGH (ref <200)
HDL: 58 mg/dL (ref >40)
LDL CALC: 137 mg/dL — AB (ref <100)
Total CHOL/HDL Ratio: 3.7 Ratio (ref <5.0)
Triglycerides: 107 mg/dL (ref <150)
VLDL: 21 mg/dL (ref <30)

## 2016-07-23 ENCOUNTER — Ambulatory Visit: Payer: 59 | Admitting: Internal Medicine

## 2016-07-23 ENCOUNTER — Other Ambulatory Visit: Payer: Self-pay | Admitting: Family Medicine

## 2016-07-23 DIAGNOSIS — E7800 Pure hypercholesterolemia, unspecified: Secondary | ICD-10-CM

## 2016-07-23 LAB — VITAMIN D 25 HYDROXY (VIT D DEFICIENCY, FRACTURES): Vit D, 25-Hydroxy: 33 ng/mL (ref 30–100)

## 2016-07-23 MED ORDER — ATORVASTATIN CALCIUM 20 MG PO TABS: 20.0000 mg | ORAL_TABLET | Freq: Every day | ORAL | 0 refills | Status: DC

## 2016-07-24 ENCOUNTER — Ambulatory Visit: Payer: 59 | Admitting: Physical Therapy

## 2016-07-24 ENCOUNTER — Encounter: Payer: Self-pay | Admitting: Physical Therapy

## 2016-07-24 DIAGNOSIS — M545 Low back pain, unspecified: Secondary | ICD-10-CM

## 2016-07-24 DIAGNOSIS — G8929 Other chronic pain: Secondary | ICD-10-CM

## 2016-07-24 DIAGNOSIS — M6281 Muscle weakness (generalized): Secondary | ICD-10-CM

## 2016-07-24 DIAGNOSIS — M62830 Muscle spasm of back: Secondary | ICD-10-CM

## 2016-07-24 NOTE — Therapy (Signed)
[place], [place], [address] Phone: [phone]   Fax:  [phone]  Physical Therapy Treatment  Patient Details  Name: Martin Murphy MRN: [id number] Date of Birth: Jul 30, 1965 Referring Provider: Danella Sensing NP   Encounter Date: 07/24/2016      PT End of Session - 07/24/16 1600    Visit Number 2   Number of Visits 16   Date for PT Re-Evaluation 09/11/16   Authorization Type UHC    PT Start Time 1517   PT Stop Time 1638   PT Time Calculation (min) 52 min   Activity Tolerance Patient tolerated treatment well   Behavior During Therapy [facility] for tasks assessed/performed      Past Medical History:  Diagnosis Date  . At risk for sleep apnea    STOP-BANG=5    SENT TO PCP 10-13-2013  . Bunion    bilateral great toe  . 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 Hypertension   . Nocturia   . Prostate cancer ([place]) DX  12/20/2012   biopsies x 2, Gleason 3+3=6  vol 36  . 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 BRONCHOSCOPY  1990's   w/  biopsy's  . BUNIONECTOMY    . FOOT SURGERY     3 surgeries total  . INGUINAL HERNIA REPAIR Bilateral right  02-09-2012/   left  1990's  . PROSTATE BIOPSY  12/20/12   gleason 6  . PROSTATE BIOPSY  06/07/13   gleason 6, vol 36 gms  . RADIOACTIVE SEED IMPLANT N/A 10/20/2013   Procedure: RADIOACTIVE SEED IMPLANT;  Surgeon: Bernestine Amass, MD;  Location: [facility];  Service: Urology;  Laterality: N/A;    There were no vitals filed for this visit.      Subjective Assessment - 07/24/16 1551    Subjective "some soreness since last session, it took a few days for it to go away"   Currently in Pain? Yes   Pain Score 6    Pain Location Back   Pain Orientation Right;Left   Pain Onset More than a month ago   Pain Frequency Constant   Aggravating Factors  standing,  walking, work tasks   Pain Relieving Factors rest, stretching                         [facility] Adult PT Treatment/Exercise - 07/24/16 1618      Lumbar Exercises: Supine   Bent Knee Raise --  2 x 1 min   Bent Knee Raise Limitations cues to keep core tight throughout exercise     Modalities   Modalities Moist Heat     Moist Heat Therapy   Number Minutes Moist Heat 10 Minutes   Moist Heat Location Lumbar Spine  in supine     Manual Therapy   Manual Therapy Joint mobilization;Soft tissue mobilization;Myofascial release   Joint Mobilization grade 3 L1-L5 PA mobs   Soft tissue mobilization IASTM over bil lumbar paraspinals   Myofascial Release stretching / rolling over bil lumbar paraspinals          Trigger Point Dry Needling - 07/24/16 1555    Consent Given? Yes   Education Handout Provided Yes   Muscles Treated Upper Body Longissimus   Longissimus Response Twitch response elicited;Palpable increased muscle length  bil L5-L3 lumbar paraspinals  PT Education - 07/24/16 1600    Education provided Yes   Education Details muscle anatomy and referral of pain. what TPDN is, benefits and after care.   Person(s) Educated Patient   Methods Explanation;Verbal cues   Comprehension Verbalized understanding;Verbal cues required          PT Short Term Goals - 07/17/16 1704      PT SHORT TERM GOAL #1   Title Patient will demsotrate a 35% improvement in lumbar extesnion    Time 4   Period Weeks   Status New     PT SHORT TERM GOAL #2   Title Patient will demsotrate a good core contraction with abdominal breathing without cuing    Time 4   Period Weeks   Status New     PT SHORT TERM GOAL #3   Title Patient will be independent with initail HEP for strengthening and stretching    Time 4   Period Weeks   Status New           PT Long Term Goals - 07/17/16 1705      PT LONG TERM GOAL #1   Title Patient will demsotrate full pain free  lumbar extension in order to pull a hose at work    Time 6   Period Weeks   Status New     PT LONG TERM GOAL #2   Title Patient will be independent with core strengthening program in order to promote stability and decreased pain with work tasks going forward    Time 6   Period Weeks   Status New     PT LONG TERM GOAL #3   Title Patient will demsotrate a 34% distability on FOTO    Time 4   Period Weeks   Status New               Plan - 07/24/16 1630    Clinical Impression Statement pt reported soreness that took a few days to calm down. initially he reported pain at a 6/10. TPDN was explained and performed on bil lumbar paraspinals at L5 and L3. IASTM and manual techniques were performed, and core strengthening which he performed well. utilized MHP post session to calm down soreness.    PT Next Visit Plan FOTO, more visits? assess response to TPDN, review stretches. Advance exercises. Consdier quadruped exercises. Review lifting technique; Consider pulling exercises.    Consulted and Agree with Plan of Care Patient      Patient will benefit from skilled therapeutic intervention in order to improve the following deficits and impairments:     Visit Diagnosis: Chronic bilateral low back pain without sciatica  Muscle weakness (generalized)  Muscle spasm of back     Problem List Patient Active Problem List   Diagnosis Date Noted  . High serum vitamin D 01/15/2016  . Elevated LDL cholesterol level 01/15/2016  . Lumbar disc disease 07/16/2015  . Chronic lumbar pain 07/02/2015  . Pain, foot, chronic 07/02/2015  . Vitamin D deficiency 04/27/2015  . Low testosterone 04/27/2015  . Peripheral neuropathy 02/18/2015  . 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 Bunion of great toe 03/31/2014  . Liver dysfunction 12/28/2013  . HTN (hypertension) 10/28/2013  . Malignant neoplasm of prostate ([place]) 08/02/2013  . MGUS (monoclonal gammopathy of unknown  significance) 05/31/2013   Starr Lake PT, DPT, LAT, ATC  07/24/16  4:35 PM      [facility] [place],  [place], [address] Phone: [phone]   Fax:  [phone]  Name: Martin Murphy MRN: [id number] Date of Birth: 1965-03-15

## 2016-07-30 ENCOUNTER — Ambulatory Visit: Payer: 59 | Admitting: Physical Therapy

## 2016-08-04 ENCOUNTER — Encounter: Payer: Self-pay | Admitting: Physical Therapy

## 2016-08-04 ENCOUNTER — Ambulatory Visit: Payer: 59 | Attending: Registered Nurse | Admitting: Physical Therapy

## 2016-08-04 DIAGNOSIS — M545 Low back pain, unspecified: Secondary | ICD-10-CM

## 2016-08-04 DIAGNOSIS — M62830 Muscle spasm of back: Secondary | ICD-10-CM | POA: Insufficient documentation

## 2016-08-04 DIAGNOSIS — M6281 Muscle weakness (generalized): Secondary | ICD-10-CM | POA: Diagnosis present

## 2016-08-04 DIAGNOSIS — G8929 Other chronic pain: Secondary | ICD-10-CM | POA: Diagnosis present

## 2016-08-04 NOTE — Therapy (Signed)
[place], [place], [address] Phone: [phone]   Fax:  [phone]  Physical Therapy Treatment  Patient Details  Name: Martin Murphy MRN: [id number] Date of Birth: 1965/05/26 Referring Provider: Danella Sensing NP   Encounter Date: 08/04/2016      PT End of Session - 08/04/16 1533    Visit Number 3   Number of Visits 16   Date for PT Re-Evaluation 09/11/16   PT Start Time 1500   PT Stop Time 1550   PT Time Calculation (min) 50 min   Activity Tolerance Patient tolerated treatment well   Behavior During Therapy [facility] for tasks assessed/performed      Past Medical History:  Diagnosis Date  . At risk for sleep apnea    STOP-BANG=5    SENT TO PCP 10-13-2013  . Bunion    bilateral great toe  . 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 Hypertension   . Nocturia   . Prostate cancer ([place]) DX  12/20/2012   biopsies x 2, Gleason 3+3=6  vol 36  . 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 BRONCHOSCOPY  1990's   w/  biopsy's  . BUNIONECTOMY    . FOOT SURGERY     3 surgeries total  . INGUINAL HERNIA REPAIR Bilateral right  02-09-2012/   left  1990's  . PROSTATE BIOPSY  12/20/12   gleason 6  . PROSTATE BIOPSY  06/07/13   gleason 6, vol 36 gms  . RADIOACTIVE SEED IMPLANT N/A 10/20/2013   Procedure: RADIOACTIVE SEED IMPLANT;  Surgeon: Bernestine Amass, MD;  Location: [facility];  Service: Urology;  Laterality: N/A;    There were no vitals filed for this visit.      Subjective Assessment - 08/04/16 1501    Subjective "I wasn't sure if the TPDN helped or not"   Currently in Pain? Yes   Pain Score 6    Pain Location Back   Pain Orientation Right;Left   Pain Descriptors / Indicators Aching   Pain Type Chronic pain   Pain Onset More than a month ago   Pain Frequency Constant   Aggravating Factors  standing,  walking for longer periods of time   Pain Relieving Factors resting, stretching, heat                         [facility] Adult PT Treatment/Exercise - 08/04/16 1531      Lumbar Exercises: Stretches   Prone Mid Back Stretch 2 reps;30 seconds  in standing holding on door knob   ITB Stretch 3 reps;30 seconds  focus for glute med     Lumbar Exercises: Aerobic   Stationary Bike Nu-Step L 5 x 5 min UE/LE     Lumbar Exercises: Supine   Other Supine Lumbar Exercises isometric adductor strengtheing 2 x 10     Knee/Hip Exercises: Sidelying   Hip ADduction Strengthening;Right;1 set;10 reps     Moist Heat Therapy   Number Minutes Moist Heat 10 Minutes   Moist Heat Location Lumbar Spine     Manual Therapy   Manual Therapy Joint mobilization;Soft tissue mobilization;Myofascial release   Manual therapy comments manual trigger point release x 3 over glute med on R   Myofascial Release stretching / rolling over bil lumbar paraspinals   Muscle Energy Technique resisted R hip flexor  adduction 5 x 10 sec hold  reported relief of pain inthe back          Trigger Point Dry Needling - 08/04/16 1505    Consent Given? --   Education Handout Provided --              PT Education - 08/04/16 1533    Education provided Yes   Education Details innominate inflare, effects/ causes and related pain . updated HEP for innominate inflare   Person(s) Educated Patient   Methods Explanation;Verbal cues   Comprehension Verbalized understanding;Verbal cues required          PT Short Term Goals - 07/17/16 1704      PT SHORT TERM GOAL #1   Title Patient will demsotrate a 35% improvement in lumbar extesnion    Time 4   Period Weeks   Status New     PT SHORT TERM GOAL #2   Title Patient will demsotrate a good core contraction with abdominal breathing without cuing    Time 4   Period Weeks   Status New     PT SHORT TERM GOAL #3   Title Patient will be independent with initail  HEP for strengthening and stretching    Time 4   Period Weeks   Status New           PT Long Term Goals - 07/17/16 1705      PT LONG TERM GOAL #1   Title Patient will demsotrate full pain free lumbar extension in order to pull a hose at work    Time 6   Period Weeks   Status New     PT LONG TERM GOAL #2   Title Patient will be independent with core strengthening program in order to promote stability and decreased pain with work tasks going forward    Time 6   Period Weeks   Status New     PT LONG TERM GOAL #3   Title Patient will demsotrate a 34% distability on FOTO    Time 4   Period [place] - 08/04/16 1545    Clinical Impression Statement pt reproted minimal improvement in pain still at 6/10. pt demonstrates R innominate inflare, focused on manual techniques and MET techniques to promote better posture/ position. held off on TPDN today to assess benefit of treatment. pt reported pain dropped to 2/10 post session.    PT Next Visit Plan FOTO, R inomminate inflare, review stretches. Advance exercises. Consdier quadruped exercises. Review lifting technique; Consider pulling exercises.    PT Home Exercise Plan SLR, briding, Clam shells, thomas stretch, pirifromis stretch, lateral trunk rotation, hip adduction, glute med stretch   Consulted and Agree with Plan of Care Patient      Patient will benefit from skilled therapeutic intervention in order to improve the following deficits and impairments:     Visit Diagnosis: Chronic bilateral low back pain without sciatica  Muscle weakness (generalized)  Muscle spasm of back     Problem List Patient Active Problem List   Diagnosis Date Noted  . High serum vitamin D 01/15/2016  . Elevated LDL cholesterol level 01/15/2016  . Lumbar disc disease 07/16/2015  . Chronic lumbar pain 07/02/2015  . Pain, foot, chronic 07/02/2015  . Vitamin D deficiency 04/27/2015  . Low testosterone  04/27/2015  . Peripheral neuropathy 02/18/2015  . Hereditary and idiopathic peripheral  neuropathy 09/20/2014  . Bunion of great toe 03/31/2014  . Liver dysfunction 12/28/2013  . HTN (hypertension) 10/28/2013  . Malignant neoplasm of prostate ([place]) 08/02/2013  . MGUS (monoclonal gammopathy of unknown significance) 05/31/2013   Starr Lake PT, DPT, LAT, ATC  08/04/16  3:48 PM      [place] [facility] [address] [place], [place], [address] Phone: [phone]   Fax:  [phone]  Name: MINH ROANHORSE MRN: [id number] Date of Birth: 1965/08/24

## 2016-08-06 ENCOUNTER — Encounter: Payer: 59 | Admitting: Physical Medicine & Rehabilitation

## 2016-08-13 ENCOUNTER — Ambulatory Visit: Payer: 59 | Admitting: Physical Therapy

## 2016-08-13 ENCOUNTER — Encounter: Payer: Self-pay | Admitting: Physical Therapy

## 2016-08-13 DIAGNOSIS — M545 Low back pain, unspecified: Secondary | ICD-10-CM

## 2016-08-13 DIAGNOSIS — M6281 Muscle weakness (generalized): Secondary | ICD-10-CM

## 2016-08-13 DIAGNOSIS — M62830 Muscle spasm of back: Secondary | ICD-10-CM

## 2016-08-13 DIAGNOSIS — G8929 Other chronic pain: Secondary | ICD-10-CM

## 2016-08-13 NOTE — Patient Instructions (Signed)

## 2016-08-13 NOTE — Therapy (Signed)
[place], [place], [address] Phone: [phone]   Fax:  [phone]  Physical Therapy Treatment  Patient Details  Name: Martin Murphy MRN: [id number] Date of Birth: [birth year] Referring Provider: Danella Sensing NP   Encounter Date: 08/13/2016      PT End of Session - 08/13/16 1508    Visit Number 4   Number of Visits 16   Date for PT Re-Evaluation 09/11/16   PT Start Time 1505   PT Stop Time 0277   PT Time Calculation (min) 43 min   Activity Tolerance Patient tolerated treatment well   Behavior During Therapy [facility] for tasks assessed/performed      Past Medical History:  Diagnosis Date  . At risk for sleep apnea    STOP-BANG=5    SENT TO PCP 10-13-2013  . Bunion    bilateral great toe  . 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 Hypertension   . Nocturia   . Prostate cancer ([place]) DX  12/20/2012   biopsies x 2, Gleason 3+3=6  vol 36  . 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 BRONCHOSCOPY  1990's   w/  biopsy's  . BUNIONECTOMY    . FOOT SURGERY     3 surgeries total  . INGUINAL HERNIA REPAIR Bilateral right  02-09-2012/   left  1990's  . PROSTATE BIOPSY  12/20/12   gleason 6  . PROSTATE BIOPSY  06/07/13   gleason 6, vol 36 gms  . RADIOACTIVE SEED IMPLANT N/A 10/20/2013   Procedure: RADIOACTIVE SEED IMPLANT;  Surgeon: Bernestine Amass, MD;  Location: [facility];  Service: Urology;  Laterality: N/A;    There were no vitals filed for this visit.      Subjective Assessment - 08/13/16 1506    Subjective "I think we were on to something, I was feeling better but today it returned"    Currently in Pain? Yes   Pain Score 6    Pain Location Back   Pain Orientation Right;Left   Pain Descriptors / Indicators Aching;Sore   Pain Frequency Intermittent   Aggravating Factors  turning   Pain  Relieving Factors resting, stretching, heat                         [facility] Adult PT Treatment/Exercise - 08/13/16 1513      Lumbar Exercises: Stretches   Active Hamstring Stretch 3 reps;30 seconds  contract/ relax with in sec contraction   Lower Trunk Rotation --  3 x 10 performed intermittently between sets   ITB Stretch 3 reps;30 seconds     Lumbar Exercises: Supine   Straight Leg Raise --  2 x 12   Straight Leg Raises Limitations quad set to    Other Supine Lumbar Exercises isometric adductor strengtheing 2 x 12 with 10 sec hold     Modalities   Modalities Ultrasound;Iontophoresis     Ultrasound   Ultrasound Location R PSIS/SIJ   Ultrasound Parameters 1.0 w/cm2 @ 100% x 8 min   Ultrasound Goals Pain     Iontophoresis   Type of Iontophoresis Dexamethasone   Location R SIJ   Dose 1 cc   Time 6 hour stat patch     Manual Therapy   Manual therapy comments manual trigger point release x 4 over glute med on R  Joint Mobilization shotgun technique, and long axis distraction of the RLE   Muscle Energy Technique resisted R hip flexor adduction 5 x 10 sec hold                PT Education - 08/13/16 1552    Education provided Yes   Education Details reviewed innominate rotations and effects. Iontophoresis and benefits / length of wear   Person(s) Educated Patient   Methods Explanation;Verbal cues   Comprehension Verbalized understanding;Verbal cues required          PT Short Term Goals - 07/17/16 1704      PT SHORT TERM GOAL #1   Title Patient will demsotrate a 35% improvement in lumbar extesnion    Time 4   Period Weeks   Status New     PT SHORT TERM GOAL #2   Title Patient will demsotrate a good core contraction with abdominal breathing without cuing    Time 4   Period Weeks   Status New     PT SHORT TERM GOAL #3   Title Patient will be independent with initail HEP for strengthening and stretching    Time 4   Period Weeks   Status  New           PT Long Term Goals - 07/17/16 1705      PT LONG TERM GOAL #1   Title Patient will demsotrate full pain free lumbar extension in order to pull a hose at work    Time 6   Period Weeks   Status New     PT LONG TERM GOAL #2   Title Patient will be independent with core strengthening program in order to promote stability and decreased pain with work tasks going forward    Time 6   Period Weeks   Status New     PT LONG TERM GOAL #3   Title Patient will demsotrate a 34% distability on FOTO    Time 4   Period [place] - 08/13/16 1508    Clinical Impression Statement pt reported relief of pain following the last visit but has pain that has returned today at 6/10. continues to demo inflar on the R innominate with mild posterior rotation. Continued MET techniques for resisted R hip flexion/ adductoin, followed with mobs to reset the pelvis / hip. utilized [place] to calm down soreness and trial iontophoresis today.     PT Next Visit Plan FOTO and goals,  assess response to ionto, R inomminate inflare, review stretches. Advance exercises. Consdier quadruped exercises. Review lifting technique; Consider pulling exercises.    PT Home Exercise Plan SLR, briding, Clam shells, thomas stretch, pirifromis stretch, lateral trunk rotation, hip adduction, glute med stretch   Consulted and Agree with Plan of Care Patient      Patient will benefit from skilled therapeutic intervention in order to improve the following deficits and impairments:  Decreased activity tolerance, Decreased mobility, Decreased strength, Increased fascial restricitons, Decreased range of motion, Increased muscle spasms, Improper body mechanics, Pain  Visit Diagnosis: Chronic bilateral low back pain without sciatica  Muscle weakness (generalized)  Muscle spasm of back     Problem List Patient Active Problem List   Diagnosis Date Noted  . High serum vitamin D 01/15/2016   . Elevated LDL cholesterol level 01/15/2016  . Lumbar disc disease 07/16/2015  . Chronic lumbar pain 07/02/2015  .  Pain, foot, chronic 07/02/2015  . Vitamin D deficiency 04/27/2015  . Low testosterone 04/27/2015  . Peripheral neuropathy 02/18/2015  . 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 Bunion of great toe 03/31/2014  . Liver dysfunction 12/28/2013  . HTN (hypertension) 10/28/2013  . Malignant neoplasm of prostate ([place]) 08/02/2013  . MGUS (monoclonal gammopathy of unknown significance) 05/31/2013   Starr Lake PT, DPT, LAT, ATC  08/13/16  3:55 PM      [facility] [facility] [address] [place], [place], [address] Phone: [phone]   Fax:  [phone]  Name: Martin Murphy MRN: [id number] Date of Birth: 01/05/1966

## 2016-08-20 ENCOUNTER — Ambulatory Visit: Payer: 59 | Admitting: Physical Therapy

## 2016-08-20 ENCOUNTER — Encounter: Payer: Self-pay | Admitting: Physical Therapy

## 2016-08-20 DIAGNOSIS — M62830 Muscle spasm of back: Secondary | ICD-10-CM

## 2016-08-20 DIAGNOSIS — M6281 Muscle weakness (generalized): Secondary | ICD-10-CM

## 2016-08-20 DIAGNOSIS — M545 Low back pain: Principal | ICD-10-CM

## 2016-08-20 DIAGNOSIS — G8929 Other chronic pain: Secondary | ICD-10-CM

## 2016-08-20 NOTE — Therapy (Signed)
[place], [place], [address] Phone: [phone]   Fax:  [phone]  Physical Therapy Treatment  Patient Details  Name: SHRAGA CUSTARD MRN: [id number] Date of Birth: 1966-02-12 Referring Provider: Danella Sensing NP   Encounter Date: 08/20/2016      PT End of Session - 08/20/16 1409    Visit Number 5   Number of Visits 16   Date for PT Re-Evaluation 09/11/16   PT Start Time 1406   PT Stop Time 1446   PT Time Calculation (min) 40 min   Activity Tolerance Patient tolerated treatment well   Behavior During Therapy [facility] for tasks assessed/performed      Past Medical History:  Diagnosis Date  . At risk for sleep apnea    STOP-BANG=5    SENT TO PCP 10-13-2013  . Bunion    bilateral great toe  . 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 Hypertension   . Nocturia   . Prostate cancer ([place]) DX  12/20/2012   biopsies x 2, Gleason 3+3=6  vol 36  . 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 BRONCHOSCOPY  1990's   w/  biopsy's  . BUNIONECTOMY    . FOOT SURGERY     3 surgeries total  . INGUINAL HERNIA REPAIR Bilateral right  02-09-2012/   left  1990's  . PROSTATE BIOPSY  12/20/12   gleason 6  . PROSTATE BIOPSY  06/07/13   gleason 6, vol 36 gms  . RADIOACTIVE SEED IMPLANT N/A 10/20/2013   Procedure: RADIOACTIVE SEED IMPLANT;  Surgeon: Bernestine Amass, MD;  Location: [facility];  Service: Urology;  Laterality: N/A;    There were no vitals filed for this visit.      Subjective Assessment - 08/20/16 1408    Subjective "no changes still feeling about a 6/10 in the low back "   Currently in Pain? Yes   Pain Score 6    Pain Location Back   Aggravating Factors  Standing, working    Pain Relieving Factors stretching, heat            [facility] PT Assessment - 08/20/16 1416      Observation/Other Assessments    Focus on Therapeutic Outcomes (FOTO)  47% limited     AROM   Lumbar Flexion 80  ERP   Lumbar Extension 30  ERp   Lumbar - Right Side Bend 10  pain at end range   Lumbar - Left Side Bend 18  no                     [facility] Adult PT Treatment/Exercise - 08/20/16 1443      Manual Therapy   Manual therapy comments manual trigger point release over proximal L pirifomris  sitting on tennis ball x 5 min muscle belly   Myofascial Release stretching / rolling over bil lumbar paraspinals   Muscle Energy Technique L sidelying posterior trunk rotation, with postiiong  L hip / knee into flexion/ adduction. resisted hip abduction 10 x 10 sec 3 sets  reseting every set                PT Education - 08/20/16 1445    Education provided Yes   Education Details anatomy of the low back/ SIJ and sacrum. explained findings regarding todays assessment and treatment techniques.  performed  before and during treament   Person(s) Educated Patient   Methods Explanation;Verbal cues   Comprehension Verbalized understanding;Verbal cues required          PT Short Term Goals - 07/17/16 1704      PT SHORT TERM GOAL #1   Title Patient will demsotrate a 35% improvement in lumbar extesnion    Time 4   Period Weeks   Status New     PT SHORT TERM GOAL #2   Title Patient will demsotrate a good core contraction with abdominal breathing without cuing    Time 4   Period Weeks   Status New     PT SHORT TERM GOAL #3   Title Patient will be independent with initail HEP for strengthening and stretching    Time 4   Period Weeks   Status New           PT Long Term Goals - 07/17/16 1705      PT LONG TERM GOAL #1   Title Patient will demsotrate full pain free lumbar extension in order to pull a hose at work    Time 6   Period Weeks   Status New     PT LONG TERM GOAL #2   Title Patient will be independent with core strengthening program in order to promote stability and  decreased pain with work tasks going forward    Time 6   Period Weeks   Status New     PT LONG TERM GOAL #3   Title Patient will demsotrate a 34% distability on FOTO    Time 4   Period [place] - 08/20/16 1446    Clinical Impression Statement pt reported no changes in pain today, and demonstrates drop in function via FOTO. Discussed with pt lack of progress if PT is really helping and pt wanted to continue a few more visits that he believed it really was. discussed that plan to continued for 3-4 more weeks and if no progess is made then he needs to return to his MD. assess his sacrum which it appeard a R on L torsion was noted. worked on L piriformis tightness and MET techniques which via palpation corrected the torsion. pt reported reduced tingling in the foot and some reduction in pain post session.    PT Next Visit Plan assess goals, R on L sacral torson/ MET technique,  review stretch Consider quadruped exercises. Review lifting technique; Consider pulling exercises.    PT Home Exercise Plan SLR, briding, Clam shells, thomas stretch, pirifromis stretch, lateral trunk rotation, hip adduction, glute med stretch   Consulted and Agree with Plan of Care Patient      Patient will benefit from skilled therapeutic intervention in order to improve the following deficits and impairments:  Decreased activity tolerance, Decreased mobility, Decreased strength, Increased fascial restricitons, Decreased range of motion, Increased muscle spasms, Improper body mechanics, Pain  Visit Diagnosis: Chronic bilateral low back pain without sciatica  Muscle weakness (generalized)  Muscle spasm of back     Problem List Patient Active Problem List   Diagnosis Date Noted  . High serum vitamin D 01/15/2016  . Elevated LDL cholesterol level 01/15/2016  . Lumbar disc disease 07/16/2015  . Chronic lumbar pain 07/02/2015  . Pain, foot, chronic 07/02/2015  . Vitamin D  deficiency 04/27/2015  . Low testosterone 04/27/2015  . Peripheral neuropathy 02/18/2015  .  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 Bunion of great toe 03/31/2014  . Liver dysfunction 12/28/2013  . HTN (hypertension) 10/28/2013  . Malignant neoplasm of prostate ([place]) 08/02/2013  . MGUS (monoclonal gammopathy of unknown significance) 05/31/2013   Starr Lake PT, DPT, LAT, ATC  08/20/16  2:52 PM      [place] [facility] [address] [place], [place], [address] Phone: [phone]   Fax:  [phone]  Name: TERRIS GERMANO MRN: [id number] Date of Birth: [birth year]

## 2016-08-21 ENCOUNTER — Telehealth: Payer: Self-pay

## 2016-08-21 NOTE — Telephone Encounter (Signed)
Requested records faxed electronically through [employer] to [facility].

## 2016-08-27 ENCOUNTER — Encounter: Payer: Self-pay | Admitting: Physical Therapy

## 2016-08-27 ENCOUNTER — Ambulatory Visit: Payer: 59 | Admitting: Physical Therapy

## 2016-08-27 DIAGNOSIS — M545 Low back pain, unspecified: Secondary | ICD-10-CM

## 2016-08-27 DIAGNOSIS — M62830 Muscle spasm of back: Secondary | ICD-10-CM

## 2016-08-27 DIAGNOSIS — M6281 Muscle weakness (generalized): Secondary | ICD-10-CM

## 2016-08-27 DIAGNOSIS — G8929 Other chronic pain: Secondary | ICD-10-CM

## 2016-08-27 NOTE — Therapy (Signed)
[place], [place], [address] Phone: [phone]   Fax:  [phone]  Physical Therapy Treatment  Patient Details  Name: Martin Murphy MRN: [id number] Date of Birth: Jul 09, 1965 Referring Provider: Danella Sensing NP   Encounter Date: 08/27/2016      PT End of Session - 08/27/16 1454    Visit Number 6   Number of Visits 16   Date for PT Re-Evaluation 09/11/16   PT Start Time 1400   PT Stop Time 1448   PT Time Calculation (min) 48 min   Activity Tolerance Patient tolerated treatment well   Behavior During Therapy [facility] for tasks assessed/performed      Past Medical History:  Diagnosis Date  . At risk for sleep apnea    STOP-BANG=5    SENT TO PCP 10-13-2013  . Bunion    bilateral great toe  . 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 Hypertension   . Nocturia   . Prostate cancer ([place]) DX  12/20/2012   biopsies x 2, Gleason 3+3=6  vol 36  . 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 BRONCHOSCOPY  1990's   w/  biopsy's  . BUNIONECTOMY    . FOOT SURGERY     3 surgeries total  . INGUINAL HERNIA REPAIR Bilateral right  02-09-2012/   left  1990's  . PROSTATE BIOPSY  12/20/12   gleason 6  . PROSTATE BIOPSY  06/07/13   gleason 6, vol 36 gms  . RADIOACTIVE SEED IMPLANT N/A 10/20/2013   Procedure: RADIOACTIVE SEED IMPLANT;  Surgeon: Bernestine Amass, MD;  Location[facility];  Service: Urology;  Laterality: N/A;    There were no vitals filed for this visit.      Subjective Assessment - 08/27/16 1356    Subjective "Felt good after last session but didn't last long, no changes today. Pain seems to come back at the end of the day at work"    Currently in Pain? Yes   Pain Score 6    Pain Location Back   Pain Orientation Right;Left   Pain Descriptors / Indicators Aching;Sore   Pain Type Chronic pain   Pain Onset More than a month ago   Pain Frequency Intermittent   Aggravating Factors  standing, working   Pain Relieving Factors stretching, heat                         [facility] Adult PT Treatment/Exercise - 08/27/16 1458      Lumbar Exercises: Stretches   Single Knee to Chest Stretch 2 reps;30 seconds  bil   Single Knee to Chest Stretch Limitations while in decompression postion   Pelvic Tilt --  2 x 15 with holding fo 2 sec with ADIM    Pelvic Tilt Limitations in decompression position   Piriformis Stretch 2 reps;30 seconds  bil   Piriformis Stretch Limitations while in decompression position     Lumbar Exercises: Supine   Bent Knee Raise 20 reps  x 2 in decompression postion   Bent Knee Raise Limitations verbal cues for ADIM     Manual Therapy   Joint Mobilization RLE long axis distract grade 5   Muscle Energy Technique L sidelying posterior trunk rotation, with postiiong  L hip / knee into flexion/ adduction. resisted hip abduction 10 x 10 sec 3 sets  PT Education - 08/27/16 1454    Education provided Yes   Education Details updated HEP for decompression position, and benefits of core strengthening to promote low back stability. Effect of the upslip innominate and benefits of treatment.   Person(s) Educated Patient   Methods Explanation;Verbal cues;Handout   Comprehension Verbalized understanding;Verbal cues required          PT Short Term Goals - 07/17/16 1704      PT SHORT TERM GOAL #1   Title Patient will demsotrate a 35% improvement in lumbar extesnion    Time 4   Period Weeks   Status New     PT SHORT TERM GOAL #2   Title Patient will demsotrate a good core contraction with abdominal breathing without cuing    Time 4   Period Weeks   Status New     PT SHORT TERM GOAL #3   Title Patient will be independent with initail HEP for strengthening and stretching    Time 4   Period Weeks   Status New           PT  Long Term Goals - 07/17/16 1705      PT LONG TERM GOAL #1   Title Patient will demsotrate full pain free lumbar extension in order to pull a hose at work    Time 6   Period Weeks   Status New     PT LONG TERM GOAL #2   Title Patient will be independent with core strengthening program in order to promote stability and decreased pain with work tasks going forward    Time 6   Period Weeks   Status New     PT LONG TERM GOAL #3   Title Patient will demsotrate a 34% distability on FOTO    Time 4   Period Weeks   Status New               Plan - 08/27/16 1501    Clinical Impression Statement pt presents to reporting 6-7/10 pain today. continued sacral mobilization which he reported minimal improvement in pain. pt demonstrated elevated iliac crest and greater trochanter on the R and reported he did step off a curb wrong. following long axis mobs he reported decreased pain and tightness. continued core strengthening in decompression position which he report relief of pain and tightness.    PT Next Visit Plan  R on L sacral torson/ MET technique,  review stretch Consider quadruped exercises. Review lifting technique; Consider pulling exercises.    Consulted and Agree with Plan of Care Patient      Patient will benefit from skilled therapeutic intervention in order to improve the following deficits and impairments:  Decreased activity tolerance, Decreased mobility, Decreased strength, Increased fascial restricitons, Decreased range of motion, Increased muscle spasms, Improper body mechanics, Pain  Visit Diagnosis: Chronic bilateral low back pain without sciatica  Muscle weakness (generalized)  Muscle spasm of back     Problem List Patient Active Problem List   Diagnosis Date Noted  . High serum vitamin D 01/15/2016  . Elevated LDL cholesterol level 01/15/2016  . Lumbar disc disease 07/16/2015  . Chronic lumbar pain 07/02/2015  . Pain, foot, chronic 07/02/2015  . Vitamin D  deficiency 04/27/2015  . Low testosterone 04/27/2015  . Peripheral neuropathy 02/18/2015  . 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 Bunion of great toe 03/31/2014  . Liver dysfunction 12/28/2013  . HTN (hypertension) 10/28/2013  . Malignant neoplasm of prostate ([place])  08/02/2013  . MGUS (monoclonal gammopathy of unknown significance) 05/31/2013   Starr Lake PT, DPT, LAT, ATC  08/27/16  3:05 PM      [place] [facility] [address] [place], [place], [address] Phone: [phone]   Fax:  [phone]  Name: Martin Murphy MRN: [id number] Date of Birth: Dec 14, 1965

## 2016-09-02 ENCOUNTER — Ambulatory Visit: Payer: 59 | Admitting: Physical Therapy

## 2016-09-09 ENCOUNTER — Encounter: Payer: Self-pay | Admitting: Physical Therapy

## 2016-09-09 ENCOUNTER — Ambulatory Visit: Payer: 59 | Attending: Registered Nurse | Admitting: Physical Therapy

## 2016-09-09 DIAGNOSIS — M62830 Muscle spasm of back: Secondary | ICD-10-CM | POA: Insufficient documentation

## 2016-09-09 DIAGNOSIS — G8929 Other chronic pain: Secondary | ICD-10-CM | POA: Insufficient documentation

## 2016-09-09 DIAGNOSIS — M6281 Muscle weakness (generalized): Secondary | ICD-10-CM | POA: Insufficient documentation

## 2016-09-09 DIAGNOSIS — M545 Low back pain: Secondary | ICD-10-CM | POA: Insufficient documentation

## 2016-09-09 NOTE — Therapy (Addendum)
[place], [place], [address] Phone: [phone]   Fax:  [phone]  Physical Therapy Treatment / Discharge summary  Patient Details  Name: Martin Murphy MRN: [id number] Date of Birth: 02/01/1966 Referring Provider: Danella Sensing NP   Encounter Date: 09/09/2016      PT End of Session - 09/09/16 1445    Visit Number 7   Number of Visits 16   Date for PT Re-Evaluation 09/11/16   PT Start Time 4034   PT Stop Time 1535   PT Time Calculation (min) 48 min   Activity Tolerance Patient tolerated treatment well   Behavior During Therapy [facility] for tasks assessed/performed      Past Medical History:  Diagnosis Date  . At risk for sleep apnea    STOP-BANG=5    SENT TO PCP 10-13-2013  . Bunion    bilateral great toe  . 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 Hypertension   . Nocturia   . Prostate cancer ([place]) DX  12/20/2012   biopsies x 2, Gleason 3+3=6  vol 36  . 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 BRONCHOSCOPY  1990's   w/  biopsy's  . BUNIONECTOMY    . FOOT SURGERY     3 surgeries total  . INGUINAL HERNIA REPAIR Bilateral right  02-09-2012/   left  1990's  . PROSTATE BIOPSY  12/20/12   gleason 6  . PROSTATE BIOPSY  06/07/13   gleason 6, vol 36 gms  . RADIOACTIVE SEED IMPLANT N/A 10/20/2013   Procedure: RADIOACTIVE SEED IMPLANT;  Surgeon: Bernestine Amass, MD;  Location: [facility];  Service: Urology;  Laterality: N/A;    There were no vitals filed for this visit.      Subjective Assessment - 09/09/16 1445    Subjective "Things are going pretty good, I think I am doing better today"    Currently in Pain? Yes   Pain Score 4    Pain Location Back   Pain Orientation Right   Pain Descriptors / Indicators Sore   Pain Type Chronic pain   Pain Frequency Intermittent   Aggravating Factors   normal every day standing/ bending and working   Pain Relieving Factors stretching, heating                         [facility] Adult PT Treatment/Exercise - 09/09/16 1451      Lumbar Exercises: Aerobic   Stationary Bike Nu-Step L 5 x 5 min UE/LE     Lumbar Exercises: Seated   Hip Flexion on Ball Limitations seated on dyna disc horizontal lift/ chop with green theraband 2 x 15 bil, palloff press 2 x 15 ea. bil with blue theraband     Lumbar Exercises: Supine   Other Supine Lumbar Exercises dead bug 3 x 10 sec hold, 2 x 8 with black physioball between hands and knees with altnating UE/LE movement     Knee/Hip Exercises: Stretches   ITB Stretch 2 reps;30 seconds     Knee/Hip Exercises: Machines for Strengthening   Hip Cybex 3 x to fatigue 35.5#     Knee/Hip Exercises: Sidelying   Hip ABduction 2 sets;Strengthening;10 reps     Manual Therapy   Manual therapy comments manual trigger point release over proximal L piriformis and R glute medius   Joint  Mobilization RLE long axis distract grade 5                PT Education - 09/09/16 1537    Education provided Yes   Education Details benefits of core strengthening to support the back, updated HEP for hip abductor strength.    Person(s) Educated Patient   Methods Explanation;Verbal cues   Comprehension Verbalized understanding;Verbal cues required          PT Short Term Goals - 07/17/16 1704      PT SHORT TERM GOAL #1   Title Patient will demsotrate a 35% improvement in lumbar extesnion    Time 4   Period Weeks   Status New     PT SHORT TERM GOAL #2   Title Patient will demsotrate a good core contraction with abdominal breathing without cuing    Time 4   Period Weeks   Status New     PT SHORT TERM GOAL #3   Title Patient will be independent with initail HEP for strengthening and stretching    Time 4   Period Weeks   Status New           PT Long Term Goals - 07/17/16 1705      PT LONG TERM  GOAL #1   Title Patient will demsotrate full pain free lumbar extension in order to pull a hose at work    Time 6   Period Weeks   Status New     PT LONG TERM GOAL #2   Title Patient will be independent with core strengthening program in order to promote stability and decreased pain with work tasks going forward    Time 6   Period Weeks   Status New     PT LONG TERM GOAL #3   Title Patient will demsotrate a 34% distability on FOTO    Time [place] - 09/09/16 1537    Clinical Impression Statement pt reports decreased pain today rated at 4/10. continued MTPR over the glute medius and stretching, with activation of the adductors on the R to correct inflare. continued focus on core strengthening which pt performed well but fatigued quickly with. pt reported no changes in pain post session and declined modalities.    PT Next Visit Plan ERO,  R on L sacral torson/ MET technique,  review stretch Consider quadruped exercises. Review lifting technique; Consider pulling exercises.    PT Home Exercise Plan SLR, briding, Clam shells, thomas stretch, pirifromis stretch, lateral trunk rotation, hip adduction, glute med stretch   Consulted and Agree with Plan of Care Patient      Patient will benefit from skilled therapeutic intervention in order to improve the following deficits and impairments:  Decreased activity tolerance, Decreased mobility, Decreased strength, Increased fascial restricitons, Decreased range of motion, Increased muscle spasms, Improper body mechanics, Pain  Visit Diagnosis: Chronic bilateral low back pain without sciatica  Muscle weakness (generalized)  Muscle spasm of back     Problem List Patient Active Problem List   Diagnosis Date Noted  . High serum vitamin D 01/15/2016  . Elevated LDL cholesterol level 01/15/2016  . Lumbar disc disease 07/16/2015  . Chronic lumbar pain 07/02/2015  . Pain, foot, chronic  07/02/2015  . Vitamin D deficiency 04/27/2015  . Low testosterone 04/27/2015  . Peripheral neuropathy 02/18/2015  . Hereditary and idiopathic peripheral  neuropathy 09/20/2014  . Bunion of great toe 03/31/2014  . Liver dysfunction 12/28/2013  . HTN (hypertension) 10/28/2013  . Malignant neoplasm of prostate ([place]) 08/02/2013  . MGUS (monoclonal gammopathy of unknown significance) 05/31/2013   Starr Lake PT, DPT, LAT, ATC  09/09/16  3:42 PM      [place] [facility] [address] [place], [place], [address] Phone: [phone]   Fax:  [phone]  Name: Martin Murphy MRN: [id number] Date of Birth: 09-11-1965       PHYSICAL THERAPY DISCHARGE SUMMARY  Visits from Start of Care: 7  Current functional level related to goals / functional outcomes: FOTO 28% limited   Remaining deficits: Pt reports intermittent pain in the low back pain, with continued N/T in the feet which he thinks may be due to side effects from medication; he  plans to self discharge and continue with exercises, discontinued medication and assess progress.    Education / Equipment: HEP, Cytogeneticist.   Plan: Patient agrees to discharge.  Patient goals were partially met. Patient is being discharged due to being pleased with the current functional level.  ?????      Kristoffer Leamon PT, DPT, LAT, ATC  09/16/16  1:56 PM

## 2016-09-16 ENCOUNTER — Ambulatory Visit: Payer: 59 | Admitting: Physical Therapy

## 2016-10-14 ENCOUNTER — Other Ambulatory Visit: Payer: Self-pay | Admitting: Family Medicine

## 2016-10-15 ENCOUNTER — Telehealth: Payer: Self-pay | Admitting: Family Medicine

## 2016-10-15 MED ORDER — SCOPOLAMINE 1 MG/3DAYS TD PT72: 1.0000 | MEDICATED_PATCH | TRANSDERMAL | 0 refills | Status: DC

## 2016-10-15 NOTE — Telephone Encounter (Signed)
Please find out how long his cruise is (ocean or river) and if he has ever used scopolamine patches in the past? We will send this in but be aware that this medication causes dry mouth and if he drinks alcohol with it, it can cause dizziness.

## 2016-10-15 NOTE — Telephone Encounter (Signed)
Pt will be on an [place] cruise for 7 days. Pt has never used patches before

## 2016-10-15 NOTE — Telephone Encounter (Signed)
Pt will ask pharmacist about medication when he picks up since hes never used it

## 2016-10-15 NOTE — Telephone Encounter (Signed)
Pt is going on a cruise on Monday 10/20/16 so requesting the patch that can be placed behind his ear. He has used this before for cruises.

## 2016-10-16 ENCOUNTER — Telehealth: Payer: Self-pay

## 2016-10-16 MED ORDER — TRANSDERM-SCOP (1.5 MG) 1 MG/3DAYS TD PT72: 1.0000 | MEDICATED_PATCH | TRANSDERMAL | 0 refills | Status: DC

## 2016-10-16 NOTE — Telephone Encounter (Signed)
Please take care of this for him. Thanks.  

## 2016-10-16 NOTE — Telephone Encounter (Signed)
Scopolamine patches are not available in generic(no longer being made) and pharmacy is asking for you to rewrite script as DAW for brand name. Thank you, RLB

## 2016-10-16 NOTE — Telephone Encounter (Signed)
Re-sent to pharmacy.

## 2016-11-09 ENCOUNTER — Other Ambulatory Visit: Payer: Self-pay | Admitting: Family Medicine

## 2016-11-10 NOTE — Telephone Encounter (Signed)
Pt will come in this week for fasting lipid recheck, hes not sure when he can come in but will refill med for 30 days

## 2016-11-12 ENCOUNTER — Other Ambulatory Visit: Payer: 59

## 2016-11-12 DIAGNOSIS — E7800 Pure hypercholesterolemia, unspecified: Secondary | ICD-10-CM

## 2016-11-12 LAB — LIPID PANEL
Cholesterol: 151 mg/dL (ref <200)
HDL: 57 mg/dL (ref >40)
LDL CHOLESTEROL (CALC): 80 mg/dL
Non-HDL Cholesterol (Calc): 94 mg/dL (calc) (ref <130)
Total CHOL/HDL Ratio: 2.6 (calc) (ref <5.0)
Triglycerides: 64 mg/dL (ref <150)

## 2016-12-02 ENCOUNTER — Telehealth: Payer: Self-pay | Admitting: Family Medicine

## 2016-12-02 NOTE — Telephone Encounter (Signed)
Please find out why he is wanting this referral. Have  I seen him for this in the past?

## 2016-12-02 NOTE — Telephone Encounter (Signed)
Pt was going to Dr. Naaman Plummer and they referred in there. He is just going to contact them and see if they can refer him back to Rehab

## 2016-12-02 NOTE — Telephone Encounter (Signed)
Pt came in and requested a referral to [place]. Pt states he was referred by his previous physician. Pt was informed that he would probably need an appt as we have not seen him for this issues. Pt declined stating he would like me to send a note back. Pt can be reached at [phone].

## 2016-12-05 ENCOUNTER — Telehealth: Payer: Self-pay | Admitting: Physical Medicine & Rehabilitation

## 2016-12-05 DIAGNOSIS — M5136 Other intervertebral disc degeneration, lumbar region: Secondary | ICD-10-CM

## 2016-12-05 NOTE — Telephone Encounter (Signed)
[address]  Rehab please

## 2016-12-05 NOTE — Telephone Encounter (Signed)
[place]

## 2016-12-05 NOTE — Telephone Encounter (Signed)
Per Ptn needs another referral to PT from [place] - contact if any questions

## 2016-12-05 NOTE — Telephone Encounter (Signed)
Where does he want to be referred to? When we find out, please make referral. thanks

## 2016-12-15 ENCOUNTER — Encounter: Payer: Self-pay | Admitting: Physical Therapy

## 2016-12-15 ENCOUNTER — Ambulatory Visit: Payer: 59 | Attending: Physical Medicine & Rehabilitation | Admitting: Physical Therapy

## 2016-12-15 DIAGNOSIS — M545 Low back pain, unspecified: Secondary | ICD-10-CM

## 2016-12-15 DIAGNOSIS — M6281 Muscle weakness (generalized): Secondary | ICD-10-CM

## 2016-12-15 DIAGNOSIS — M62830 Muscle spasm of back: Secondary | ICD-10-CM

## 2016-12-15 DIAGNOSIS — G8929 Other chronic pain: Secondary | ICD-10-CM | POA: Insufficient documentation

## 2016-12-15 NOTE — Therapy (Signed)
[place], [place], [address] Phone: [phone]   Fax:  [phone]  Physical Therapy Evaluation  Patient Details  Name: Martin Murphy MRN: [id number] Date of Birth: 10-21-1965 Referring Provider: Dr. Alger Simons  Encounter Date: 12/15/2016      PT End of Session - 12/15/16 1451    Visit Number 1   Number of Visits 8   PT Start Time 1420   PT Stop Time 1500   PT Time Calculation (min) 40 min   Activity Tolerance Patient tolerated treatment well   Behavior During Therapy [facility] for tasks assessed/performed      Past Medical History:  Diagnosis Date  . At risk for sleep apnea    STOP-BANG=5    SENT TO PCP 10-13-2013  . Bunion    bilateral great toe  . 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 Hypertension   . Nocturia   . Prostate cancer ([place]) DX  12/20/2012   biopsies x 2, Gleason 3+3=6  vol 36  . 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 BRONCHOSCOPY  1990's   w/  biopsy's  . BUNIONECTOMY    . FOOT SURGERY     3 surgeries total  . INGUINAL HERNIA REPAIR Bilateral right  02-09-2012/   left  1990's  . PROSTATE BIOPSY  12/20/12   gleason 6  . PROSTATE BIOPSY  06/07/13   gleason 6, vol 36 gms  . RADIOACTIVE SEED IMPLANT N/A 10/20/2013   Procedure: RADIOACTIVE SEED IMPLANT;  Surgeon: Bernestine Amass, MD;  Location: [facility];  Service: Urology;  Laterality: N/A;    There were no vitals filed for this visit.       Subjective Assessment - 12/15/16 1422    Subjective I need an adjustment! I feel like I am sitting on a brick. I am back to have you fix my back.  Pt seen this past Spring with good result.  About a month ago, Pt jerked and tripped, caught himself while walking.  He has had an increase in pain for the past month.  He cont to have pain in Rt. hip/back and foot pain (tingling and  numbness from peripheral neuropathy).     Pertinent History Diagnosed peripheral neuropathy    Limitations Walking;Standing;House hold activities   How long can you sit comfortably? pain with sitting, resting at night    How long can you stand comfortably? 20-30 min    How long can you walk comfortably? walking does not help his pain but he can do it    Diagnostic tests none new    Patient Stated Goals Less pain    Currently in Pain? Yes   Pain Score 4    Pain Location Pelvis  low back   Pain Orientation Right   Pain Descriptors / Indicators Tightness   Pain Type Chronic pain   Pain Onset More than a month ago   Pain Frequency Intermittent   Aggravating Factors  activity, sitting after a long day    Pain Relieving Factors stretching, heat, manual    Effect of Pain on Daily Activities difficulty ADLs, work, recreation             [facility] PT Assessment - 12/15/16 0001      Assessment   Medical Diagnosis DDD, lumbar    Referring Provider Dr. 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    Leisure Walking, [place], being outside      Observation/Other Assessments   Focus on Therapeutic Outcomes (FOTO)  NT pt not loaded into FOTO      Sensation   Additional Comments neuropathy burning both feet      Posture/Postural Control   Posture/Postural Control Postural limitations   Postural Limitations Forward head   Posture Comments Rt. ASIS lower than Lt. in supine , leg longer      AROM   Lumbar Flexion WFL  no pain    Lumbar Extension 50% discomfort central    Lumbar - Right Side Bend stiff, 25% ;limited    Lumbar - Left Side Bend stiff, 25% limited    Lumbar - Right Rotation WFL   Lumbar - Left Rotation [facility]      Strength   Right Hip Extension 4/5   Left Hip Extension 4+/5     Palpation   Palpation comment TTP Rt. pirifomis and lateral SI border, min tenderness grossly in lumbar paraspinals         Objective measurements completed on examination: See above findings.        PT Education - 12/15/16 1526    Education provided Yes   Education Details HEP from previous episode, SIJ and PT.POC    Person(s) Educated Patient   Methods Explanation;Handout;Tactile cues;Verbal cues   Comprehension Verbalized understanding;Returned demonstration;Need further instruction          PT Short Term Goals - 12/15/16 1526      PT SHORT TERM GOAL #1   Title Patient will demontrate HEP without cues    Time 4   Period Weeks   Status New   Target Date 01/12/17     PT SHORT TERM GOAL #2   Title Patient will demonstrate a good core contraction with abdominal breathing without cueing    Time 4   Period Weeks   Status New   Target Date 01/12/17     PT SHORT TERM GOAL #3   Title Pt will be able to report improvement in transitions from all surfaces, inlcuding bed, floor due to less pain in back.    Time 4   Period Weeks   Status New   Target Date 01/12/17           PT Long Term Goals - 12/15/16 1528      PT LONG TERM GOAL #1   Title Patient will demonstratel pain free lumbar extension in order to pull a hose at work    Time 8   Period Weeks   Status New   Target Date 02/09/17     PT LONG TERM GOAL #2   Title Patient will be independent with core strengthening program in order to promote stability and decreased pain with work tasks going forward    Time 8   Period Weeks   Status New  Target Date 02/09/17     PT LONG TERM GOAL #3   Title Pt will be able to return to  recreational activities without limitation of pain.    Time 8   Period Weeks   Status New   Target Date 02/09/17                Plan - 12/15/16 1529    Clinical Impression Statement Patient presents for low complexity of lumbosacral pain which was previously controlled.  He has returned to doing his HEP but has been unsuccessful.  He did demo a Rt. innominate rotation  (ant.).  Reiussued previous HEP for basic back and hip strengthening.  He should do well but willl only be able to do 1 time per week.  Pain was localized to Rt. piriformis today.    Clinical Presentation Stable   Clinical Decision Making Low   Rehab Potential Excellent   PT Frequency 1x / week   PT Duration 8 weeks   PT Treatment/Interventions ADLs/Self Care Home Management;Cryotherapy;Electrical Stimulation;Iontophoresis 4mg /ml Dexamethasone;Gait training;Stair training;Moist Heat;Ultrasound;Therapeutic activities;Therapeutic exercise;Patient/family education;Manual techniques;Taping;Dry needling;Passive range of motion;Traction;Functional mobility training;Neuromuscular re-education   PT Next Visit Plan review stretches Consider quadruped exercises. Review lifting technique; manage pain, manual    PT Home Exercise Plan SLR, briding, Clam shells, thomas stretch, pirifromis stretch, lateral trunk rotation, hip adduction, glute med stretch   Consulted and Agree with Plan of Care Patient      Patient will benefit from skilled therapeutic intervention in order to improve the following deficits and impairments:  Decreased activity tolerance, Decreased mobility, Decreased strength, Increased fascial restricitons, Decreased range of motion, Increased muscle spasms, Improper body mechanics, Pain  Visit Diagnosis: Chronic bilateral low back pain without sciatica  Muscle weakness (generalized)  Muscle spasm of back     Problem List Patient Active Problem List   Diagnosis Date Noted  . High serum vitamin D  01/15/2016  . Elevated LDL cholesterol level 01/15/2016  . Lumbar disc disease 07/16/2015  . Chronic lumbar pain 07/02/2015  . Pain, foot, chronic 07/02/2015  . Vitamin D deficiency 04/27/2015  . Low testosterone 04/27/2015  . Peripheral neuropathy 02/18/2015  . 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 Bunion of great toe 03/31/2014  . Liver dysfunction 12/28/2013  . HTN (hypertension) 10/28/2013  . Malignant neoplasm of prostate ([place]) 08/02/2013  . MGUS (monoclonal gammopathy of unknown significance) 05/31/2013    Davontae Prusinski 12/15/2016, 3:34 PM  [place] [facility] [address] [place], [place], [address] Phone: [phone]   Fax:  [phone]  Name: Martin Murphy MRN: [id number] Date of Birth: 04-15-1965   Raeford Razor, PT 12/15/16 3:35 PM Phone: [phone] Fax: [phone]

## 2016-12-15 NOTE — Patient Instructions (Addendum)
Reissue initial HEP from Martin Murphy. PT  Piriformis LTR Bridge Clam blue band SLR Core activation  2 times per day, x 10 reps

## 2016-12-20 ENCOUNTER — Other Ambulatory Visit: Payer: Self-pay | Admitting: Family Medicine

## 2016-12-29 ENCOUNTER — Encounter: Payer: Self-pay | Admitting: Physical Therapy

## 2016-12-29 ENCOUNTER — Ambulatory Visit: Payer: 59 | Admitting: Physical Therapy

## 2016-12-29 DIAGNOSIS — M545 Low back pain: Secondary | ICD-10-CM | POA: Diagnosis not present

## 2016-12-29 DIAGNOSIS — G8929 Other chronic pain: Secondary | ICD-10-CM

## 2016-12-29 DIAGNOSIS — M6281 Muscle weakness (generalized): Secondary | ICD-10-CM

## 2016-12-29 DIAGNOSIS — M62830 Muscle spasm of back: Secondary | ICD-10-CM

## 2016-12-29 NOTE — Therapy (Signed)
[place], [place], [address] Phone: [phone]   Fax:  [phone]  Physical Therapy Treatment  Patient Details  Name: Martin Murphy MRN: [id number] Date of Birth: 01-06-1966 Referring Provider: Dr. Alger Simons  Encounter Date: 12/29/2016      PT End of Session - 12/29/16 1505    Visit Number 2   Number of Visits 8   Date for PT Re-Evaluation 09/11/16   Authorization Type UHC    PT Start Time 2633   PT Stop Time 1505   PT Time Calculation (min) 47 min   Activity Tolerance Patient tolerated treatment well   Behavior During Therapy [facility] for tasks assessed/performed      Past Medical History:  Diagnosis Date  . At risk for sleep apnea    STOP-BANG=5    SENT TO PCP 10-13-2013  . Bunion    bilateral great toe  . 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 Hypertension   . Nocturia   . Prostate cancer ([place]) DX  12/20/2012   biopsies x 2, Gleason 3+3=6  vol 36  . Sarcoidosis of lung ([place])    per lung bx 1990's    Past Surgical History:  Procedure Laterality Date  . BRONCHOSCOPY  1990's   w/  biopsy's  . BUNIONECTOMY    . FOOT SURGERY     3 surgeries total  . INGUINAL HERNIA REPAIR Bilateral right  02-09-2012/   left  1990's  . PROSTATE BIOPSY  12/20/12   gleason 6  . PROSTATE BIOPSY  06/07/13   gleason 6, vol 36 gms  . RADIOACTIVE SEED IMPLANT N/A 10/20/2013   Procedure: RADIOACTIVE SEED IMPLANT;  Surgeon: Bernestine Amass, MD;  Location: [facility];  Service: Urology;  Laterality: N/A;    There were no vitals filed for this visit.      Subjective Assessment - 12/29/16 1420    Subjective Back pain is 4/10.  I really want to get my hip back into alignment.  My feet are burning.  Pt has neuropathy but feels there is a link between his foot pain and back pain.    Pertinent History Diagnosed peripheral neuropathy    Currently in Pain? Yes   Pain Score 4     Pain Location Pelvis   Pain Orientation Right   Pain Type Chronic pain   Pain Radiating Towards to both legs and feet    Pain Onset More than a month ago   Pain Frequency Intermittent            [facility] PT Assessment - 12/29/16 0001      Posture/Postural Control   Posture Comments Rt. ASIS higher in standing, moved to level in supine following MET                 [facility] Adult PT Treatment/Exercise - 12/29/16 0001      Lumbar Exercises: Stretches   Pelvic Tilt 5 reps;10 seconds     Lumbar Exercises: Aerobic   Stationary Bike Nu-Step L 5 x 5 min UE/LE     Lumbar Exercises: Supine   Clam 15 reps   Clam Limitations feet on small ball   also done unilaterally.    Bent Knee Raise 10 reps   Bent Knee Raise Limitations sacrum on ball    Bridge 10 reps   Other Supine Lumbar Exercises dead bug LE only added UE reaching out x 10 each UE  Manual Therapy   Manual therapy comments manual trigger point release over proximal L piriformis and bilateral glute medius   Myofascial Release bilateral hips and lumbar    Muscle Energy Technique resisted hip flexion by PT x 5 and performed with dowel x 5                   PT Short Term Goals - 12/29/16 1506      PT SHORT TERM GOAL #1   Title Patient will demontrate HEP without cues    Status On-going     PT SHORT TERM GOAL #2   Title Patient will demonstrate a good core contraction with abdominal breathing without cueing    Status On-going     PT SHORT TERM GOAL #3   Title Pt will be able to report improvement in transitions from all surfaces, inlcuding bed, floor due to less pain in back.    Status On-going           PT Long Term Goals - 12/15/16 1528      PT LONG TERM GOAL #1   Title Patient will demonstratel pain free lumbar extension in order to pull a hose at work    Time 8   Period Weeks   Status New   Target Date 02/09/17     PT LONG TERM GOAL #2   Title Patient will be independent with core  strengthening program in order to promote stability and decreased pain with work tasks going forward    Time 8   Period Weeks   Status New   Target Date 02/09/17     PT LONG TERM GOAL #3   Title Pt will be able to return to recreational activities without limitation of pain.    Time 8   Period Weeks   Status New   Target Date 02/09/17               Plan - 12/29/16 1445    Clinical Impression Statement Patient with vague pain in pelvis, hips and low back, feet.  He requested I correct his alignment.  Corrected for a Rt. posterior innominate today.  Gave him instruction on how to do at home.  Stability exercises enhanced with soft small ball., seems to understand concepts.     PT Next Visit Plan review stretches.  DID MET work? Consider quadruped exercises. Review lifting technique; manage pain, manual    PT Home Exercise Plan SLR, bridging, Clam shells, thomas stretch, pirifromis stretch, lateral trunk rotation, hip adduction, glute med stretch   Consulted and Agree with Plan of Care Patient      Patient will benefit from skilled therapeutic intervention in order to improve the following deficits and impairments:  Decreased activity tolerance, Decreased mobility, Decreased strength, Increased fascial restricitons, Decreased range of motion, Increased muscle spasms, Improper body mechanics, Pain  Visit Diagnosis: Chronic bilateral low back pain without sciatica  Muscle weakness (generalized)  Muscle spasm of back     Problem List Patient Active Problem List   Diagnosis Date Noted  . High serum vitamin D 01/15/2016  . Elevated LDL cholesterol level 01/15/2016  . Lumbar disc disease 07/16/2015  . Chronic lumbar pain 07/02/2015  . Pain, foot, chronic 07/02/2015  . Vitamin D deficiency 04/27/2015  . Low testosterone 04/27/2015  . Peripheral neuropathy 02/18/2015  . 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 Bunion of great toe 03/31/2014  . Liver  dysfunction 12/28/2013  . HTN (hypertension) 10/28/2013  .  Malignant neoplasm of prostate ([place]) 08/02/2013  . MGUS (monoclonal gammopathy of unknown significance) 05/31/2013    PAA,JENNIFER 12/29/2016, 3:16 PM  [facility] [address] [place], [place], [address] Phone: [phone]   Fax:  [phone]  Name: VERN GUERETTE MRN: [id number] Date of Birth: 16-Jul-1965  Raeford Razor, PT 12/29/16 3:16 PM Phone: [phone] Fax: [phone]

## 2017-01-05 ENCOUNTER — Encounter: Payer: Self-pay | Admitting: Physical Therapy

## 2017-01-05 ENCOUNTER — Ambulatory Visit: Payer: 59 | Attending: Registered Nurse | Admitting: Physical Therapy

## 2017-01-05 DIAGNOSIS — G8929 Other chronic pain: Secondary | ICD-10-CM | POA: Diagnosis present

## 2017-01-05 DIAGNOSIS — M545 Low back pain: Secondary | ICD-10-CM | POA: Insufficient documentation

## 2017-01-05 DIAGNOSIS — M6281 Muscle weakness (generalized): Secondary | ICD-10-CM | POA: Diagnosis present

## 2017-01-05 DIAGNOSIS — M62830 Muscle spasm of back: Secondary | ICD-10-CM

## 2017-01-05 NOTE — Therapy (Signed)
[place], [place], [address] Phone: [phone]   Fax:  [phone]  Physical Therapy Treatment  Patient Details  Name: Martin Murphy MRN: [id number] Date of Birth: 1966-02-18 Referring Provider: Dr. Alger Simons   Encounter Date: 01/05/2017  PT End of Session - 01/05/17 1508    Visit Number  3    Number of Visits  8    Date for PT Re-Evaluation  02/09/17    PT Start Time  1416    PT Stop Time  1500    PT Time Calculation (min)  44 min    Activity Tolerance  Patient tolerated treatment well    Behavior During Therapy  [facility] for tasks assessed/performed       Past Medical History:  Diagnosis Date  . At risk for sleep apnea    STOP-BANG=5    SENT TO PCP 10-13-2013  . Bunion    bilateral great toe  . 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 Hypertension   . Nocturia   . Prostate cancer ([place]) DX  12/20/2012   biopsies x 2, Gleason 3+3=6  vol 36  . Sarcoidosis of lung ([place])    per lung bx 1990's    Past Surgical History:  Procedure Laterality Date  . BRONCHOSCOPY  1990's   w/  biopsy's  . BUNIONECTOMY    . FOOT SURGERY     3 surgeries total  . INGUINAL HERNIA REPAIR Bilateral right  02-09-2012/   left  1990's  . PROSTATE BIOPSY  12/20/12   gleason 6  . PROSTATE BIOPSY  06/07/13   gleason 6, vol 36 gms    There were no vitals filed for this visit.  Subjective Assessment - 01/05/17 1420    Subjective  Rt. sided back pain 6/10 and feet are 8/10.  I bent over in my room last night to get a sock and I hurt my back.     Currently in Pain?  Yes    Pain Score  6     Pain Location  Back    Pain Orientation  Right    Pain Descriptors / Indicators  Sore    Pain Type  Chronic pain    Pain Onset  More than a month ago    Pain Frequency  Intermittent    Aggravating Factors   bending the wrong way     Pain Relieving Factors  stretching           [facility] Adult PT  Treatment/Exercise - 01/05/17 0001      Posture/Postural Control   Posture Comments  pelvis level today in supine       Self-Care   Self-Care  ADL's;Lifting;Posture;Other Self-Care Comments    Lifting  Golfer's lift, hip hinging     Posture  standing, using muscle/core support vs ligament     Other Self-Care Comments   HEP       Lumbar Exercises: Stretches   Active Hamstring Stretch  3 reps;30 seconds    Single Knee to Chest Stretch  2 reps;30 seconds    Lower Trunk Rotation  10 seconds 10 reps   10 reps   Pelvic Tilt  5 reps;10 seconds    Prone on Elbows Stretch  5 reps    Piriformis Stretch  2 reps;30 seconds bil   bil   Piriformis Stretch Limitations  knee to opp shoulder add twist       Lumbar Exercises: Standing  Functional Squats  15 reps    Functional Squats Limitations  iproed with arms in flexion in front of body     Other Standing Lumbar Exercises  hip hinge single leg x 8     Other Standing Lumbar Exercises  sink stretch x 5       Lumbar Exercises: Supine   Bridge  10 reps      Knee/Hip Exercises: Stretches   Gastroc Stretch  2 reps    Soleus Stretch  2 reps    Other Knee/Hip Stretches  slant board and off step for calf , knee hyperextended       Knee/Hip Exercises: Machines for Strengthening   Cybex Leg Press  2 plates x 15 and calf raises, followed by calf stretch                PT Short Term Goals - 01/05/17 1509      PT SHORT TERM GOAL #1   Title  Patient will demontrate HEP without cues     Status  On-going      PT SHORT TERM GOAL #2   Title  Patient will demonstrate a good core contraction with abdominal breathing without cueing     Baseline  needs cues     Status  On-going      PT SHORT TERM GOAL #3   Title  Pt will be able to report improvement in transitions from all surfaces, inlcuding bed, floor due to less pain in back.     Status  On-going        PT Long Term Goals - 01/05/17 1509      PT LONG TERM GOAL #1   Title  Patient  will demonstratel pain free lumbar extension in order to pull a hose at work     Baseline  discomfort     Status  On-going      PT LONG TERM GOAL #2   Title  Patient will be independent with core strengthening program in order to promote stability and decreased pain with work tasks going forward     Status  On-going      PT [place] #3   Title  Pt will be able to return to recreational activities without limitation of pain.     Status  On-going            Plan - 01/05/17 1441    Clinical Impression Statement  Less pain after session, both in feet and low back. Asked him to focus on consistency with HEP, use good body mechanics for simple lifting at home and at work.     PT Next Visit Plan  Hip hinging and body mechanics,  Consider quadruped exercises. Review lifting technique; manage pain, manual     PT Home Exercise Plan  SLR, bridging, Clam shells, thomas stretch, pirifromis stretch, lateral trunk rotation, hip adduction, glute med stretch, post pelvic tilt     Consulted and Agree with Plan of Care  Patient       Patient will benefit from skilled therapeutic intervention in order to improve the following deficits and impairments:  Decreased activity tolerance, Decreased mobility, Decreased strength, Increased fascial restricitons, Decreased range of motion, Increased muscle spasms, Improper body mechanics, Pain  Visit Diagnosis: Chronic bilateral low back pain without sciatica  Muscle weakness (generalized)  Muscle spasm of back     Problem List Patient Active Problem List   Diagnosis Date Noted  . High serum vitamin D 01/15/2016  .  Elevated LDL cholesterol level 01/15/2016  . Lumbar disc disease 07/16/2015  . Chronic lumbar pain 07/02/2015  . Pain, foot, chronic 07/02/2015  . Vitamin D deficiency 04/27/2015  . Low testosterone 04/27/2015  . Peripheral neuropathy 02/18/2015  . 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 Bunion of great toe  03/31/2014  . Liver dysfunction 12/28/2013  . HTN (hypertension) 10/28/2013  . Malignant neoplasm of prostate ([place]) 08/02/2013  . MGUS (monoclonal gammopathy of unknown significance) 05/31/2013    Tyrae Alcoser 01/05/2017, 3:11 PM  [facility] [address] [place], [place], [address] Phone: [phone]   Fax:  [phone]  Name: Martin Murphy MRN: [id number] Date of Birth: [birth year]   Raeford Razor, PT 01/05/17 3:12 PM Phone: [phone] Fax: [phone]

## 2017-01-05 NOTE — Patient Instructions (Signed)
Pelvic Tilt: Posterior - Legs Bent (Supine)    Tighten stomach and flatten back by rolling pelvis down. Hold ___5_ seconds. Relax. Repeat __10__ times per set. Do _1___ sets per session. Do ___2_ sessions per day.  http://orth.exer.us/202   Copyright  VHI. All rights reserved.  Bracing With Bridging (Hook-Lying)    With neutral spine, tighten pelvic floor and abdominals and hold. Lift bottom. Repeat _10_ times. Do __2_ times a day.  Start with a pelvic tilt then roll up your spine, one bone at a time to stretch your back.  Copyright  VHI. All rights reserved.

## 2017-01-12 ENCOUNTER — Encounter: Payer: Self-pay | Admitting: Physical Therapy

## 2017-01-12 ENCOUNTER — Ambulatory Visit: Payer: 59 | Admitting: Physical Therapy

## 2017-01-12 DIAGNOSIS — M62830 Muscle spasm of back: Secondary | ICD-10-CM

## 2017-01-12 DIAGNOSIS — M6281 Muscle weakness (generalized): Secondary | ICD-10-CM

## 2017-01-12 DIAGNOSIS — M545 Low back pain: Principal | ICD-10-CM

## 2017-01-12 DIAGNOSIS — G8929 Other chronic pain: Secondary | ICD-10-CM

## 2017-01-12 NOTE — Therapy (Signed)
[place], [place], [address] Phone: [phone]   Fax:  [phone]  Physical Therapy Treatment  Patient Details  Name: Martin Murphy MRN: [id number] Date of Birth: 04-11-1965 Referring Provider: Dr. Alger Simons   Encounter Date: 01/12/2017  PT End of Session - 01/12/17 1424    Visit Number  4    Number of Visits  8    Date for PT Re-Evaluation  02/09/17    Authorization Type  UHC     PT Start Time  1416    PT Stop Time  1500    PT Time Calculation (min)  44 min    Activity Tolerance  Patient tolerated treatment well    Behavior During Therapy  [facility] for tasks assessed/performed       Past Medical History:  Diagnosis Date  . At risk for sleep apnea    STOP-BANG=5    SENT TO PCP 10-13-2013  . Bunion    bilateral great toe  . 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 Hypertension   . Nocturia   . Prostate cancer ([place]) DX  12/20/2012   biopsies x 2, Gleason 3+3=6  vol 36  . 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 BRONCHOSCOPY  1990's   w/  biopsy's  . BUNIONECTOMY    . FOOT SURGERY     3 surgeries total  . INGUINAL HERNIA REPAIR Bilateral right  02-09-2012/   left  1990's  . PROSTATE BIOPSY  12/20/12   gleason 6  . PROSTATE BIOPSY  06/07/13   gleason 6, vol 36 gms    There were no vitals filed for this visit.  Subjective Assessment - 01/12/17 1418    Subjective  I'm doing OK today, about 3/10 in feet and back.     Currently in Pain?  Yes    Pain Score  3     Pain Location  Back    Pain Orientation  Lower    Pain Descriptors / Indicators  Sore    Pain Type  Chronic pain    Pain Onset  More than a month ago    Pain Frequency  Intermittent    Aggravating Factors   moving the wrong way     Pain Relieving Factors  stretching , core exercises?            [place] Adult PT Treatment/Exercise -  01/12/17 0001      Lumbar Exercises: Stretches   Active Hamstring Stretch  3 reps;30 seconds    Lower Trunk Rotation  10 seconds 10 reps    ITB Stretch  2 reps;30 seconds    Piriformis Stretch  2 reps;30 seconds      Lumbar Exercises: Aerobic   Stationary Bike  Nu-Step L 5 x 5 min UE/LE      Lumbar Exercises: Supine   Other Supine Lumbar Exercises  diagonal core SLR and UE flexion "chop"    Other Supine Lumbar Exercises  "good morning" full body extension       Lumbar Exercises: Quadruped   [place]/Old Horse  5 reps    Opposite Arm/Leg Raise  Right arm/Left leg;Left arm/Right leg;5 reps    Plank  x 3, has to balalnce on dorsum  due to pain in toes     Other Quadruped Lumbar Exercises  childs pose       Knee/Hip  Exercises: Stretches   Piriformis Stretch  2 reps      Knee/Hip Exercises: Machines for Sport and exercise psychologist see notes       Freemotion:  High Row 3 plates mini squat x 15  Extension 3 plates cues for shoulder neutral, avoid momentum and use core  Oblique twist 2 plates cues for maintaining Diagonal pull 1 plate x 10           PT Short Term Goals - 01/12/17 1504      PT SHORT TERM GOAL #1   Title  Patient will demontrate HEP without cues     Status  Achieved      PT SHORT TERM GOAL #2   Title  Patient will demonstrate a good core contraction with abdominal breathing without cueing     Baseline  cues for more challenging core ex     Status  Partially Met      PT SHORT TERM GOAL #3   Title  Pt will be able to report improvement in transitions from all surfaces, inlcuding bed, floor due to less pain in back.     Status  Achieved        PT Long Term Goals - 01/12/17 1505      PT LONG TERM GOAL #1   Title  Patient will demonstratel pain free lumbar extension in order to pull a hose at work     Status  Unable to assess      PT LONG TERM GOAL #2   Title  Patient will be independent with core strengthening program in order to  promote stability and decreased pain with work tasks going forward     Status  On-going      PT LONG TERM GOAL #3   Title  Pt will be able to return to recreational activities without limitation of pain.     Status  Unable to assess            Plan - 01/12/17 1457    Clinical Impression Statement  Pt has been more consistent with HEP for core.  Min cues needed for safety during more challenging core exercises, observed decreased Transverse Abdominus activation and more rectus abd.  Pt would benefit from more PT for core challenges and education.  Verbally reports being cognizant of body mechanics with lifting but not all of the time.     PT Next Visit Plan  Hip hinging and body mechanics,  Consider quadruped exercises. Review lifting technique; manage pain, manual     PT Home Exercise Plan  SLR, bridging, Clam shells, thomas stretch, pirifromis stretch, lateral trunk rotation, hip adduction, glute med stretch, post pelvic tilt     Consulted and Agree with Plan of Care  Patient       Patient will benefit from skilled therapeutic intervention in order to improve the following deficits and impairments:  Decreased activity tolerance, Decreased mobility, Decreased strength, Increased fascial restricitons, Decreased range of motion, Increased muscle spasms, Improper body mechanics, Pain  Visit Diagnosis: Chronic bilateral low back pain without sciatica  Muscle weakness (generalized)  Muscle spasm of back     Problem List Patient Active Problem List   Diagnosis Date Noted  . High serum vitamin D 01/15/2016  . Elevated LDL cholesterol level 01/15/2016  . Lumbar disc disease 07/16/2015  . Chronic lumbar pain 07/02/2015  . Pain, foot, chronic 07/02/2015  . Vitamin D deficiency 04/27/2015  . Low testosterone 04/27/2015  .  Peripheral neuropathy 02/18/2015  . 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 Bunion of great toe 03/31/2014  . Liver dysfunction 12/28/2013   . HTN (hypertension) 10/28/2013  . Malignant neoplasm of prostate ([place]) 08/02/2013  . MGUS (monoclonal gammopathy of unknown significance) 05/31/2013    PAA,JENNIFER 01/12/2017, 3:09 PM  [facility] [address] [place], [place], [address] Phone: [phone]   Fax:  [phone]  Name: Martin Murphy MRN: [id number] Date of Birth: February 04, 1966  Raeford Razor, PT 01/12/17 3:10 PM Phone: [phone] Fax: [phone]

## 2017-01-19 ENCOUNTER — Encounter: Payer: 59 | Admitting: Physical Therapy

## 2017-01-19 ENCOUNTER — Ambulatory Visit: Payer: 59 | Admitting: Physical Therapy

## 2017-01-19 ENCOUNTER — Encounter: Payer: Self-pay | Admitting: Physical Therapy

## 2017-01-19 DIAGNOSIS — M545 Low back pain, unspecified: Secondary | ICD-10-CM

## 2017-01-19 DIAGNOSIS — M6281 Muscle weakness (generalized): Secondary | ICD-10-CM

## 2017-01-19 DIAGNOSIS — G8929 Other chronic pain: Secondary | ICD-10-CM

## 2017-01-19 DIAGNOSIS — M62830 Muscle spasm of back: Secondary | ICD-10-CM

## 2017-01-19 NOTE — Therapy (Signed)
[place], [place], [address] Phone: [phone]   Fax:  [phone]  Physical Therapy Treatment and Discharge   Patient Details  Name: Martin Murphy MRN: [id number] Date of Birth: 11-13-1965 Referring Provider: Dr. Alger Simons   Encounter Date: 01/19/2017  PT End of Session - 01/19/17 1109    Visit Number  5    Authorization Type  [employer]     PT Start Time  1016    PT Stop Time  1100    PT Time Calculation (min)  44 min    Activity Tolerance  Patient tolerated treatment well    Behavior During Therapy  [facility] for tasks assessed/performed       Past Medical History:  Diagnosis Date  . At risk for sleep apnea    STOP-BANG=5    SENT TO PCP 10-13-2013  . Bunion    bilateral great toe  . 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 Hypertension   . Nocturia   . Prostate cancer ([place]) DX  12/20/2012   biopsies x 2, Gleason 3+3=6  vol 36  . Sarcoidosis of lung ([place])    per lung bx 1990's    Past Surgical History:  Procedure Laterality Date  . BRONCHOSCOPY  1990's   w/  biopsy's  . BUNIONECTOMY    . FOOT SURGERY     3 surgeries total  . INGUINAL HERNIA REPAIR Bilateral right  02-09-2012/   left  1990's  . PROSTATE BIOPSY  12/20/12   gleason 6  . PROSTATE BIOPSY  06/07/13   gleason 6, vol 36 gms  . RADIOACTIVE SEED IMPLANT N/A 10/20/2013   Performed by Bernestine Amass, MD at [facility]    There were no vitals filed for this visit.  Subjective Assessment - 01/19/17 1017    Subjective  Back is pretty good today. Off all week for the holiday.  It was hurting bad Sat.  Got on the floor and stretched, which helped a bit.     Currently in Pain?  No/denies         [facility] PT Assessment - 01/19/17 0001      Sensation   Additional Comments  neuropathy burning both feet       Posture/Postural Control   Posture Comments  Rt. pelvis rotated anterior       AROM   Lumbar Flexion  WFL no pain     Lumbar Extension  25% discomfort     Lumbar - Right Side Bend  min pain on R to R.    Lumbar - Left Side Bend  WNL     Lumbar - Right Rotation  WFL    Lumbar - Left Rotation  [facility]       Strength   Right Hip Extension  4+/5    Right Hip ABduction  4+/5    Left Hip Extension  4+/5    Left Hip ABduction  4+/5         [facility] Adult PT Treatment/Exercise - 01/19/17 0001      Self-Care   Posture  standing, using muscle/core support vs ligament     Other Self-Care Comments   HEP, MET       Lumbar Exercises: Stretches   Single Knee to Chest Stretch  2 reps;30 seconds    Lower Trunk Rotation  10 seconds 10 reps      Lumbar Exercises: Aerobic   Stationary Bike  6 min  L3 for warm up       Lumbar Exercises: Quadruped   Opposite Arm/Leg Raise  Right arm/Left leg;Left arm/Right leg;5 reps    Other Quadruped Lumbar Exercises  childs pose, 3 ways       Knee/Hip Exercises: Stretches   Piriformis Stretch  Both;3 reps      Manual Therapy   Manual Therapy  Passive ROM;Manual Traction    Joint Mobilization  long axis distraction Rt. LE, oscillation, post capsule stretch  Rt. inflare? Worked on stabilizing pelvis     Passive ROM  hip ER/IR Rt. LE                PT Short Term Goals - 01/19/17 1100      PT SHORT TERM GOAL #1   Title  Patient will demontrate HEP without cues     Status  Achieved      PT SHORT TERM GOAL #2   Title  Patient will demonstrate a good core contraction with abdominal breathing without cueing     Baseline  min cues for core exercises     Status  Partially Met      PT SHORT TERM GOAL #3   Title  Pt will be able to report improvement in transitions from all surfaces, inlcuding bed, floor due to less pain in back.     Status  Achieved        PT Long Term Goals - 01/19/17 1020      PT LONG TERM GOAL #1   Title  Patient will demonstratel pain free lumbar extension in order to pull a hose at work     Baseline  discomfort  end range     Status  Achieved      PT LONG TERM GOAL #2   Title  Patient will be independent with core strengthening program in order to promote stability and decreased pain with work tasks going forward     Status  Achieved      PT LONG TERM GOAL #3   Title  Pt will be able to return to recreational activities without limitation of pain.     Baseline  self limits and does not try to push it     Status  Partially Met            Plan - 01/19/17 1101    Clinical Impression Statement  Patient has met or partially met all goals.  He knows his stretches and can take his pain from severe to mild or none.  He was reminded to be mindful of his movements at work, use good body mechanics and continue HEP.  He agreed but did say he would like to come once per month to be rechecked.  I educated him on the purpose of PT and said that progress and exercises can not be measured in that long span of time.  I recommended monthly massage therapy and consistent exercise.     PT Next Visit Plan  NA, DC     PT Home Exercise Plan  SLR, bridging, Clam shells, thomas stretch, pirifromis stretch, lateral trunk rotation, hip adduction, glute med stretch, post pelvic tilt     Consulted and Agree with Plan of Care  Patient       Patient will benefit from skilled therapeutic intervention in order to improve the following deficits and impairments:     Visit Diagnosis: Chronic bilateral low back pain without sciatica  Muscle weakness (generalized)  Muscle spasm of  back     Problem List Patient Active Problem List   Diagnosis Date Noted  . High serum vitamin D 01/15/2016  . Elevated LDL cholesterol level 01/15/2016  . Lumbar disc disease 07/16/2015  . Chronic lumbar pain 07/02/2015  . Pain, foot, chronic 07/02/2015  . Vitamin D deficiency 04/27/2015  . Low testosterone 04/27/2015  . Peripheral neuropathy 02/18/2015  . 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 Bunion of great  toe 03/31/2014  . Liver dysfunction 12/28/2013  . HTN (hypertension) 10/28/2013  . Malignant neoplasm of prostate ([place]) 08/02/2013  . MGUS (monoclonal gammopathy of unknown significance) 05/31/2013    PAA,JENNIFER 01/19/2017, 11:11 AM  [facility], [place] [address] [place], [place], [address] Phone: [phone]   Fax:  [phone]  Name: Martin Murphy MRN: [id number] Date of Birth: 07/10/1965  PHYSICAL THERAPY DISCHARGE SUMMARY  Visits from Start of Care: 5  Current functional level related to goals / functional outcomes: See above, self limits due to pain but no pain this AM.   Remaining deficits: Hip and core strength, body mechanics ongoing    Education / Equipment: Lifting, posture, HEP Plan: Patient agrees to discharge.  Patient goals were partially met. Patient is being discharged due to being pleased with the current functional level.  ?????    Raeford Razor, PT 01/19/17 11:11 AM Phone: [phone] Fax: [phone]

## 2017-04-06 ENCOUNTER — Other Ambulatory Visit: Payer: Self-pay | Admitting: Family Medicine

## 2017-04-06 NOTE — Telephone Encounter (Signed)
Pt is coming in next week for cpe. Will wait until appt as he has enough

## 2017-04-13 ENCOUNTER — Telehealth: Payer: Self-pay | Admitting: Family Medicine

## 2017-04-13 NOTE — Telephone Encounter (Signed)
Per last refill on 2/4 pt states he had enough until his appt

## 2017-04-13 NOTE — Telephone Encounter (Signed)
[facility] req HCTZ tab 25 mg  #90 CPE 04/15/17

## 2017-04-14 NOTE — Progress Notes (Deleted)
   Subjective:    Patient ID: Martin Murphy, male    DOB: 1965-09-15, 52 y.o.   MRN: [id number]  HPI No chief complaint on file.  He is here for a complete physical exam. Previous medical care: Last CPE:  Other providers:  Past medical history: Surgeries:  Family history: Mental health history:  Social history: Lives with ***, works as ***,  *** Smoking, drinking alcohol, drug use Diet: *** Exercise: ***  Immunizations:  Health maintenance:  Colonoscopy: Last PSA: Last Dental Exam: Last Eye Exam:  Wears seatbelt always, uses sunscreen, smoke detectors in home and functioning, does not text while driving, feels safe in home environment.  Reviewed allergies, medications, past medical, surgical, family, and social history.    Review of Systems Review of Systems Constitutional: -fever, -chills, -sweats, -unexpected weight change,-fatigue ENT: -runny nose, -ear pain, -sore throat Cardiology:  -chest pain, -palpitations, -edema Respiratory: -cough, -shortness of breath, -wheezing Gastroenterology: -abdominal pain, -nausea, -vomiting, -diarrhea, -constipation  Hematology: -bleeding or bruising problems Musculoskeletal: -arthralgias, -myalgias, -joint swelling, -back pain Ophthalmology: -vision changes Urology: -dysuria, -difficulty urinating, -hematuria, -urinary frequency, -urgency Neurology: -headache, -weakness, -tingling, -numbness       Objective:   Physical Exam There were no vitals taken for this visit.  General Appearance:    Alert, cooperative, no distress, appears stated age  Head:    Normocephalic, without obvious abnormality, atraumatic  Eyes:    PERRL, conjunctiva/corneas clear, EOM's intact, fundi    benign  Ears:    Normal TM's and external ear canals  Nose:   Nares normal, mucosa normal, no drainage or sinus   tenderness  Throat:   Lips, mucosa, and tongue normal; teeth and gums normal  Neck:   Supple, no lymphadenopathy;  thyroid:  no    enlargement/tenderness/nodules; no carotid   bruit or JVD  Back:    Spine nontender, no curvature, ROM normal, no CVA     tenderness  Lungs:     Clear to auscultation bilaterally without wheezes, rales or     ronchi; respirations unlabored  Chest Wall:    No tenderness or deformity   Heart:    Regular rate and rhythm, S1 and S2 normal, no murmur, rub   or gallop  Breast Exam:    No chest wall tenderness, masses or gynecomastia  Abdomen:     Soft, non-tender, nondistended, normoactive bowel sounds,    no masses, no hepatosplenomegaly  Genitalia:    Normal male external genitalia without lesions.  Testicles without masses.  No inguinal hernias.  Rectal:    Normal sphincter tone, no masses or tenderness; guaiac negative stool.  Prostate smooth, no nodules, not enlarged.  Extremities:   No clubbing, cyanosis or edema  Pulses:   2+ and symmetric all extremities  Skin:   Skin color, texture, turgor normal, no rashes or lesions  Lymph nodes:   Cervical, supraclavicular, and axillary nodes normal  Neurologic:   CNII-XII intact, normal strength, sensation and gait; reflexes 2+ and symmetric throughout          Psych:   Normal mood, affect, hygiene and grooming.           Assessment & Plan:  Routine general medical examination at a health care facility  Essential hypertension  Elevated LDL cholesterol level

## 2017-04-15 ENCOUNTER — Encounter: Payer: 59 | Admitting: Family Medicine

## 2017-04-16 ENCOUNTER — Encounter: Payer: Self-pay | Admitting: Family Medicine

## 2017-05-18 ENCOUNTER — Ambulatory Visit: Payer: 59 | Admitting: Family Medicine

## 2017-05-18 ENCOUNTER — Encounter: Payer: Self-pay | Admitting: Family Medicine

## 2017-05-18 VITALS — BP 130/78 | HR 80 | Wt 210.0 lb

## 2017-05-18 DIAGNOSIS — G8929 Other chronic pain: Secondary | ICD-10-CM | POA: Diagnosis not present

## 2017-05-18 DIAGNOSIS — I10 Essential (primary) hypertension: Secondary | ICD-10-CM | POA: Diagnosis not present

## 2017-05-18 DIAGNOSIS — M79673 Pain in unspecified foot: Secondary | ICD-10-CM

## 2017-05-18 DIAGNOSIS — M25849 Other specified joint disorders, unspecified hand: Secondary | ICD-10-CM

## 2017-05-18 DIAGNOSIS — K7689 Other specified diseases of liver: Secondary | ICD-10-CM

## 2017-05-18 NOTE — Progress Notes (Signed)
   Subjective:    Patient ID: Martin Murphy, male    DOB: [birth year], 52 y.o.   MRN: [id number]  HPI Chief Complaint  Patient presents with  . bp check    not been checking his bp    He is here to follow up on HTN and other chronic health conditions.  He also has a new complaint of cysts to bilateral hands.  At his last visit in May we reduced his hydrochlorothiazide dose from 25 mg to 12.5 mg.  He is hoping to stop the medication altogether however he has not been checking his blood pressure at home and today it is barely at goal.  Reports diet has not been low in sodium and has been fairly unhealthy.   He as not been exercising.   States he has been using CBD oil for chronic foot pain and this is helping. States he has also been sleeping well.   For the past several weeks he has noticed cysts to both palmar surfaces of his hands and states that they are occasionally sore.  He is requesting to be sent to a hand specialist for this problem.   History of elevated liver function tests. This has been evaluated by GI.   He continues seeing Dr. Risa Grill every 6 months for prostate cancer. Next appointment in May.   Denies fever, chills, dizziness, chest pain, palpitations, shortness of breath, abdominal pain, N/V/D, urinary symptoms, LE edema.   Reviewed allergies, medications, past medical, surgical, family, and social history.    Review of Systems Pertinent positives and negatives in the history of present illness.     Objective:   Physical Exam BP 130/78   Pulse 80   Wt 210 lb (95.3 kg)   BMI 30.13 kg/m   Small, round, smooth superficial masses to bilateral palmar surfaces, tenderness over mass on right hand. Left mass appears to be more superficial and possibly a callus vs cyst. No erythema, edema or sign of infection. Normal sensation and motor function bilaterally.       Assessment & Plan:  Essential hypertension - Plan: CBC with Differential/Platelet, Comprehensive  metabolic panel  Chronic foot pain, unspecified laterality  Liver dysfunction - Plan: Comprehensive metabolic panel  Cyst of joint of hand, unspecified laterality  Discussed that his blood pressure is at goal range today.  He does not check it at home.  We will keep him on HCTZ 12.5 mg for now.  Advised him to spot check his blood pressure at home just to make sure it staying in goal range.   Counseled on low-sodium and healthy diet as well as increasing exercise. He is using CBD oil for foot pain and states it helps pain as well as it helps him to sleep at night. History of elevated liver enzymes and alkaline phosphate.  He has been evaluated by GI for this issue and had a negative ultrasound.  We will continue to monitor his liver function tests. Bilateral cysts of the palmar surfaces of hands.  He would like to see a hand specialist regarding these. We will follow-up pending labs and he will return in June for complete physical exam, fasting.

## 2017-05-18 NOTE — Patient Instructions (Addendum)
The [facility] Appointment Wednesday March 20th @ 2:15pm with Dr. Rulon Abide. Address: [name] [name]  Spot check your blood pressure at home. Make sure your BP is    DASH Eating Plan DASH stands for "Dietary Approaches to Stop Hypertension." The DASH eating plan is a healthy eating plan that has been shown to reduce high blood pressure (hypertension). It may also reduce your risk for type 2 diabetes, heart disease, and stroke. The DASH eating plan may also help with weight loss. What are tips for following this plan? General guidelines  Avoid eating more than 2,300 mg (milligrams) of salt (sodium) a day. If you have hypertension, you may need to reduce your sodium intake to 1,500 mg a day.  Limit alcohol intake to no more than 1 drink a day for nonpregnant women and 2 drinks a day for men. One drink equals 12 oz of beer, 5 oz of wine, or 1 oz of hard liquor.  Work with your health care provider to maintain a healthy body weight or to lose weight. Ask what an ideal weight is for you.  Get at least 30 minutes of exercise that causes your heart to beat faster (aerobic exercise) most days of the week. Activities may include walking, swimming, or biking.  Work with your health care provider or diet and nutrition specialist (dietitian) to adjust your eating plan to your individual calorie needs. Reading food labels  Check food labels for the amount of sodium per serving. Choose foods with less than 5 percent of the Daily Value of sodium. Generally, foods with less than 300 mg of sodium per serving fit into this eating plan.  To find whole grains, look for the word "whole" as the first word in the ingredient list. Shopping  Buy products labeled as "low-sodium" or "no salt added."  Buy fresh foods. Avoid canned foods and premade or frozen meals. Cooking  Avoid adding salt when cooking. Use salt-free seasonings or herbs instead of table salt or sea salt. Check with your health  care provider or pharmacist before using salt substitutes.  Do not fry foods. Cook foods using healthy methods such as baking, boiling, grilling, and broiling instead.  Cook with heart-healthy oils, such as olive, canola, soybean, or sunflower oil. Meal planning   Eat a balanced diet that includes: ? 5 or more servings of fruits and vegetables each day. At each meal, try to fill half of your plate with fruits and vegetables. ? Up to 6-8 servings of whole grains each day. ? Less than 6 oz of lean meat, poultry, or fish each day. A 3-oz serving of meat is about the same size as a deck of cards. One egg equals 1 oz. ? 2 servings of low-fat dairy each day. ? A serving of nuts, seeds, or beans 5 times each week. ? Heart-healthy fats. Healthy fats called Omega-3 fatty acids are found in foods such as flaxseeds and coldwater fish, like sardines, salmon, and mackerel.  Limit how much you eat of the following: ? Canned or prepackaged foods. ? Food that is high in trans fat, such as fried foods. ? Food that is high in saturated fat, such as fatty meat. ? Sweets, desserts, sugary drinks, and other foods with added sugar. ? Full-fat dairy products.  Do not salt foods before eating.  Try to eat at least 2 vegetarian meals each week.  Eat more home-cooked food and less restaurant, buffet, and fast food.  When eating at a  restaurant, ask that your food be prepared with less salt or no salt, if possible. What foods are recommended? The items listed may not be a complete list. Talk with your dietitian about what dietary choices are best for you. Grains Whole-grain or whole-wheat bread. Whole-grain or whole-wheat pasta. Brown rice. Martin Murphy. Bulgur. Whole-grain and low-sodium cereals. Pita bread. Low-fat, low-sodium crackers. Whole-wheat flour tortillas. Vegetables Fresh or frozen vegetables (raw, steamed, roasted, or grilled). Low-sodium or reduced-sodium tomato and vegetable juice.  Low-sodium or reduced-sodium tomato sauce and tomato paste. Low-sodium or reduced-sodium canned vegetables. Fruits All fresh, dried, or frozen fruit. Canned fruit in natural juice (without added sugar). Meat and other protein foods Skinless chicken or [place]. Ground chicken or [place]. Pork with fat trimmed off. Fish and seafood. Egg whites. Dried beans, peas, or lentils. Unsalted nuts, nut butters, and seeds. Unsalted canned beans. Lean cuts of beef with fat trimmed off. Low-sodium, lean deli meat. Dairy Low-fat (1%) or fat-free (skim) milk. Fat-free, low-fat, or reduced-fat cheeses. Nonfat, low-sodium ricotta or cottage cheese. Low-fat or nonfat yogurt. Low-fat, low-sodium cheese. Fats and oils Soft margarine without trans fats. Vegetable oil. Low-fat, reduced-fat, or light mayonnaise and salad dressings (reduced-sodium). Canola, safflower, olive, soybean, and sunflower oils. Avocado. Seasoning and other foods Herbs. Spices. Seasoning mixes without salt. Unsalted popcorn and pretzels. Fat-free sweets. What foods are not recommended? The items listed may not be a complete list. Talk with your dietitian about what dietary choices are best for you. Grains Baked goods made with fat, such as croissants, muffins, or some breads. Dry pasta or rice meal packs. Vegetables Creamed or fried vegetables. Vegetables in a cheese sauce. Regular canned vegetables (not low-sodium or reduced-sodium). Regular canned tomato sauce and paste (not low-sodium or reduced-sodium). Regular tomato and vegetable juice (not low-sodium or reduced-sodium). Martin Murphy. Olives. Fruits Canned fruit in a light or heavy syrup. Fried fruit. Fruit in cream or butter sauce. Meat and other protein foods Fatty cuts of meat. Ribs. Fried meat. Martin Murphy. Sausage. Bologna and other processed lunch meats. Salami. Fatback. Hotdogs. Bratwurst. Salted nuts and seeds. Canned beans with added salt. Canned or smoked fish. Whole eggs or egg yolks. Chicken  or [place] with skin. Dairy Whole or 2% milk, cream, and half-and-half. Whole or full-fat cream cheese. Whole-fat or sweetened yogurt. Full-fat cheese. Nondairy creamers. Whipped toppings. Processed cheese and cheese spreads. Fats and oils Butter. Stick margarine. Lard. Shortening. Ghee. Bacon fat. Tropical oils, such as coconut, palm kernel, or palm oil. Seasoning and other foods Salted popcorn and pretzels. Onion salt, garlic salt, seasoned salt, table salt, and sea salt. Worcestershire sauce. Tartar sauce. Barbecue sauce. Teriyaki sauce. Soy sauce, including reduced-sodium. Steak sauce. Canned and packaged gravies. Fish sauce. Oyster sauce. Cocktail sauce. Horseradish that you find on the shelf. Ketchup. Mustard. Meat flavorings and tenderizers. Bouillon cubes. Hot sauce and Tabasco sauce. Premade or packaged marinades. Premade or packaged taco seasonings. Relishes. Regular salad dressings. Where to find more information:  National Heart, Lung, and [place]: https://wilson-eaton.com/  American Heart Association: www.heart.org Summary  The DASH eating plan is a healthy eating plan that has been shown to reduce high blood pressure (hypertension). It may also reduce your risk for type 2 diabetes, heart disease, and stroke.  With the DASH eating plan, you should limit salt (sodium) intake to 2,300 mg a day. If you have hypertension, you may need to reduce your sodium intake to 1,500 mg a day.  When on the DASH eating plan, aim to eat more  fresh fruits and vegetables, whole grains, lean proteins, low-fat dairy, and heart-healthy fats.  Work with your health care provider or diet and nutrition specialist (dietitian) to adjust your eating plan to your individual calorie needs. This information is not intended to replace advice given to you by your health care provider. Make sure you discuss any questions you have with your health care provider. Document Released: 02/06/2011 Document Revised:  02/11/2016 Document Reviewed: 02/11/2016 Elsevier Interactive Patient Education  [employer].

## 2017-05-19 LAB — CBC WITH DIFFERENTIAL/PLATELET
BASOS ABS: 0 10*3/uL (ref 0.0–0.2)
Basos: 0 %
EOS (ABSOLUTE): 0.2 10*3/uL (ref 0.0–0.4)
Eos: 4 %
Hematocrit: 40.3 % (ref 37.5–51.0)
Hemoglobin: 13.5 g/dL (ref 13.0–17.7)
IMMATURE GRANULOCYTES: 0 %
Immature Grans (Abs): 0 10*3/uL (ref 0.0–0.1)
Lymphocytes Absolute: 2 10*3/uL (ref 0.7–3.1)
Lymphs: 40 %
MCH: 28.3 pg (ref 26.6–33.0)
MCHC: 33.5 g/dL (ref 31.5–35.7)
MCV: 85 fL (ref 79–97)
MONOS ABS: 0.4 10*3/uL (ref 0.1–0.9)
Monocytes: 9 %
NEUTROS PCT: 47 %
Neutrophils Absolute: 2.4 10*3/uL (ref 1.4–7.0)
PLATELETS: 219 10*3/uL (ref 150–379)
RBC: 4.77 x10E6/uL (ref 4.14–5.80)
RDW: 14.3 % (ref 12.3–15.4)
WBC: 5 10*3/uL (ref 3.4–10.8)

## 2017-05-19 LAB — COMPREHENSIVE METABOLIC PANEL
A/G RATIO: 1.2 (ref 1.2–2.2)
ALT: 57 IU/L — AB (ref 0–44)
AST: 46 IU/L — AB (ref 0–40)
Albumin: 4.1 g/dL (ref 3.5–5.5)
Alkaline Phosphatase: 167 IU/L — ABNORMAL HIGH (ref 39–117)
BUN/Creatinine Ratio: 16 (ref 9–20)
BUN: 13 mg/dL (ref 6–24)
Bilirubin Total: 0.3 mg/dL (ref 0.0–1.2)
CALCIUM: 9.6 mg/dL (ref 8.7–10.2)
CO2: 24 mmol/L (ref 20–29)
CREATININE: 0.83 mg/dL (ref 0.76–1.27)
Chloride: 101 mmol/L (ref 96–106)
GFR calc Af Amer: 116 mL/min/{1.73_m2} (ref >59)
GFR, EST NON AFRICAN AMERICAN: 100 mL/min/{1.73_m2} (ref >59)
Globulin, Total: 3.5 g/dL (ref 1.5–4.5)
Glucose: 75 mg/dL (ref 65–99)
POTASSIUM: 4.4 mmol/L (ref 3.5–5.2)
Sodium: 141 mmol/L (ref 134–144)
Total Protein: 7.6 g/dL (ref 6.0–8.5)

## 2017-05-20 DIAGNOSIS — M79642 Pain in left hand: Secondary | ICD-10-CM | POA: Insufficient documentation

## 2017-06-02 ENCOUNTER — Other Ambulatory Visit: Payer: Self-pay | Admitting: Family Medicine

## 2017-10-01 ENCOUNTER — Other Ambulatory Visit: Payer: Self-pay | Admitting: Family Medicine

## 2017-10-12 ENCOUNTER — Telehealth: Payer: Self-pay | Admitting: Internal Medicine

## 2017-10-12 MED ORDER — SCOPOLAMINE 1 MG/3DAYS TD PT72: 1.0000 | MEDICATED_PATCH | TRANSDERMAL | 0 refills | Status: DC

## 2017-10-12 NOTE — Telephone Encounter (Signed)
Pt is going on a cruise Saturday and will be gone for 4 days. He would like the transderm patch that you gave him last year for the cruise. [facility] in [facility]

## 2017-10-12 NOTE — Telephone Encounter (Signed)
Pt was notified. Sent 2 patches in

## 2017-10-12 NOTE — Telephone Encounter (Signed)
This is ok. Remind him to avoid alcohol and stay hydrated.

## 2017-12-30 ENCOUNTER — Other Ambulatory Visit: Payer: Self-pay | Admitting: Family Medicine

## 2017-12-31 MED ORDER — HYDROCHLOROTHIAZIDE 25 MG PO TABS: 25.0000 mg | ORAL_TABLET | Freq: Every day | ORAL | 0 refills | Status: DC

## 2017-12-31 NOTE — Telephone Encounter (Signed)
Pt scheduled for cpe end of november

## 2018-01-19 ENCOUNTER — Encounter: Payer: Self-pay | Admitting: Family Medicine

## 2018-01-19 ENCOUNTER — Ambulatory Visit: Payer: 59 | Admitting: Family Medicine

## 2018-01-19 VITALS — BP 126/80 | HR 71 | Ht 71.0 in | Wt 204.8 lb

## 2018-01-19 DIAGNOSIS — R945 Abnormal results of liver function studies: Secondary | ICD-10-CM

## 2018-01-19 DIAGNOSIS — G8929 Other chronic pain: Secondary | ICD-10-CM

## 2018-01-19 DIAGNOSIS — Z87898 Personal history of other specified conditions: Secondary | ICD-10-CM | POA: Diagnosis not present

## 2018-01-19 DIAGNOSIS — E559 Vitamin D deficiency, unspecified: Secondary | ICD-10-CM | POA: Diagnosis not present

## 2018-01-19 DIAGNOSIS — I10 Essential (primary) hypertension: Secondary | ICD-10-CM

## 2018-01-19 DIAGNOSIS — E7800 Pure hypercholesterolemia, unspecified: Secondary | ICD-10-CM

## 2018-01-19 DIAGNOSIS — Z79899 Other long term (current) drug therapy: Secondary | ICD-10-CM

## 2018-01-19 DIAGNOSIS — R7989 Other specified abnormal findings of blood chemistry: Secondary | ICD-10-CM

## 2018-01-19 DIAGNOSIS — M545 Low back pain: Secondary | ICD-10-CM

## 2018-01-19 MED ORDER — SCOPOLAMINE 1 MG/3DAYS TD PT72: 1.0000 | MEDICATED_PATCH | TRANSDERMAL | 0 refills | Status: DC

## 2018-01-19 NOTE — Progress Notes (Signed)
Subjective:    Patient ID: Martin Murphy, male    DOB: 1965/10/30, 52 y.o.   MRN: [id number]  HPI Chief Complaint  Patient presents with  . med check    fasting med check. sometimes he has bruises that pop up on body and not sure why.  declines flu shot   He is here for a fasting medication management visit.  This was initially a fasting CPE however he would like to hold off on wellness visit.  Hypertension-checks blood pressure sporadically and states it is always normal.  Taking daily HCTZ and doing well with this. History of elevated LDL with greater than 7.5% 10-year ASCVD risk.  Taking daily statin and no side effects. Denies dizziness, chest pain, palpitations, DOE, orthopnea, abdominal pain, nausea, vomiting, diarrhea.  History of elevated vitamin D.  He is taking a daily supplement.  Recheck this today.  Chronic low back pain that has been persistent for the past 4-5 years. No known injury. No new symptoms.  Denies fever, chills, numbness, tingling, weakness.  Denies being awakened at night with pain.  No history of chronic steroid use. No loss of control of bowels or bladder.  Does not want to take medications and has tried several in the past. Several injections in his back in the past. Would like to see a new orthopedist to get another opinion.  PT in 2018 helped some.    Other providers:   Dr. Naaman Plummer- back pain Dr. Fredna Dow- hand specialist Dr. Julien Nordmann- oncology for MGUS Dr. Havery Moros- GI Dr. Jacqualyn Posey- Podiatry  Dr. 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 Job is active and requires climbing ladders, lifting and pulling.  He is married  Taking a cruise in one week. Requests refill of scopolamine patch. Cruise is for one week.   Declines flu shot and hepatitis A vaccine.   Reviewed allergies, medications, past medical, surgical, family, and social history.    Review of Systems Pertinent positives and negatives in the history of  present illness.     Objective:   Physical Exam BP 126/80   Pulse 71   Ht 5\' 11"  (1.803 m)   Wt 204 lb 12.8 oz (92.9 kg)   BMI 28.56 kg/m   Alert and oriented and in no acute distress.  Respirations unlabored.  Skin is warm and dry.  Speech, mood and thought process normal.  Gait is normal.      Assessment & Plan:  Essential hypertension - Plan: CBC with Differential/Platelet, Comprehensive metabolic panel  Vitamin D deficiency - Plan: VITAMIN D 25 Hydroxy (Vit-D Deficiency, Fractures)  Elevated LDL cholesterol level - Plan: Lipid panel  H/O motion sickness - Plan: scopolamine (TRANSDERM-SCOP) 1 MG/3DAYS  Medication management - Plan: Lipid panel, VITAMIN D 25 Hydroxy (Vit-D Deficiency, Fractures)  Elevated LFTs - Plan: Comprehensive metabolic panel  Chronic bilateral low back pain without sciatica - Plan: Ambulatory referral to Orthopedic Surgery  His visit today was switched from a CPE to medication management visit due to patient preference.  He is fasting. Hypertension-blood pressure is in goal range today.  Continue on current medications. He will continue on daily statin due to elevated LDL in the past as well as increased 10-year ASCVD risk.  Doing well on statin.  Recheck fasting lipids today Counseling done on healthy diet and getting at least 150 minutes of physical activity per week. DASH diet handout was given History of chronically elevated liver function tests and has been to GI  for evaluation.  Recheck LFTs today. Counseling on potential side effects of scopolamine patch for motion sickness.  Upcoming cruise.  He has done well using these in the past. Chronic low back pain and requests to see a new orthopedist.  He declines medication for pain today.  States he is not in favor of taking narcotic pain medication.  PT in the past as well as injections that helped temporarily.  He is managing to perform ADLs and work full-time however states he is barely getting by.   No red flag symptoms. He declines a flu shot as well as hepatitis A vaccine which I recommended due to travel Follow-up pending labs or sometime in February for a CPE

## 2018-01-19 NOTE — Patient Instructions (Addendum)
Your BP is normal today at 126/80. Continue on your medication.   Make sure you are getting at least 150 minutes of physical activity per week.   We will call you with your lab results.  Continue on your statin for now.   You will receive a call from [facility] to schedule an appointment for chronic back pain.    DASH Eating Plan DASH stands for "Dietary Approaches to Stop Hypertension." The DASH eating plan is a healthy eating plan that has been shown to reduce high blood pressure (hypertension). It may also reduce your risk for type 2 diabetes, heart disease, and stroke. The DASH eating plan may also help with weight loss. What are tips for following this plan? General guidelines  Avoid eating more than 2,300 mg (milligrams) of salt (sodium) a day. If you have hypertension, you may need to reduce your sodium intake to 1,500 mg a day.  Limit alcohol intake to no more than 1 drink a day for nonpregnant women and 2 drinks a day for men. One drink equals 12 oz of beer, 5 oz of wine, or 1 oz of hard liquor.  Work with your health care provider to maintain a healthy body weight or to lose weight. Ask what an ideal weight is for you.  Get at least 30 minutes of exercise that causes your heart to beat faster (aerobic exercise) most days of the week. Activities may include walking, swimming, or biking.  Work with your health care provider or diet and nutrition specialist (dietitian) to adjust your eating plan to your individual calorie needs. Reading food labels  Check food labels for the amount of sodium per serving. Choose foods with less than 5 percent of the Daily Value of sodium. Generally, foods with less than 300 mg of sodium per serving fit into this eating plan.  To find whole grains, look for the word "whole" as the first word in the ingredient list. Shopping  Buy products labeled as "low-sodium" or "no salt added."  Buy fresh foods. Avoid canned foods and premade or  frozen meals. Cooking  Avoid adding salt when cooking. Use salt-free seasonings or herbs instead of table salt or sea salt. Check with your health care provider or pharmacist before using salt substitutes.  Do not fry foods. Cook foods using healthy methods such as baking, boiling, grilling, and broiling instead.  Cook with heart-healthy oils, such as olive, canola, soybean, or sunflower oil. Meal planning   Eat a balanced diet that includes: ? 5 or more servings of fruits and vegetables each day. At each meal, try to fill half of your plate with fruits and vegetables. ? Up to 6-8 servings of whole grains each day. ? Less than 6 oz of lean meat, poultry, or fish each day. A 3-oz serving of meat is about the same size as a deck of cards. One egg equals 1 oz. ? 2 servings of low-fat dairy each day. ? A serving of nuts, seeds, or beans 5 times each week. ? Heart-healthy fats. Healthy fats called Omega-3 fatty acids are found in foods such as flaxseeds and coldwater fish, like sardines, salmon, and mackerel.  Limit how much you eat of the following: ? Canned or prepackaged foods. ? Food that is high in trans fat, such as fried foods. ? Food that is high in saturated fat, such as fatty meat. ? Sweets, desserts, sugary drinks, and other foods with added sugar. ? Full-fat dairy products.  Do not salt  foods before eating.  Try to eat at least 2 vegetarian meals each week.  Eat more home-cooked food and less restaurant, buffet, and fast food.  When eating at a restaurant, ask that your food be prepared with less salt or no salt, if possible. What foods are recommended? The items listed may not be a complete list. Talk with your dietitian about what dietary choices are best for you. Grains Whole-grain or whole-wheat bread. Whole-grain or whole-wheat pasta. Brown rice. [name]. Bulgur. Whole-grain and low-sodium cereals. Pita bread. Low-fat, low-sodium crackers. Whole-wheat flour  tortillas. Vegetables Fresh or frozen vegetables (raw, steamed, roasted, or grilled). Low-sodium or reduced-sodium tomato and vegetable juice. Low-sodium or reduced-sodium tomato sauce and tomato paste. Low-sodium or reduced-sodium canned vegetables. Fruits All fresh, dried, or frozen fruit. Canned fruit in natural juice (without added sugar). Meat and other protein foods Skinless chicken or [place]. Ground chicken or [place]. Pork with fat trimmed off. Fish and seafood. Egg whites. Dried beans, peas, or lentils. Unsalted nuts, nut butters, and seeds. Unsalted canned beans. Lean cuts of beef with fat trimmed off. Low-sodium, lean deli meat. Dairy Low-fat (1%) or fat-free (skim) milk. Fat-free, low-fat, or reduced-fat cheeses. Nonfat, low-sodium ricotta or cottage cheese. Low-fat or nonfat yogurt. Low-fat, low-sodium cheese. Fats and oils Soft margarine without trans fats. Vegetable oil. Low-fat, reduced-fat, or light mayonnaise and salad dressings (reduced-sodium). Canola, safflower, olive, soybean, and sunflower oils. Avocado. Seasoning and other foods Herbs. Spices. Seasoning mixes without salt. Unsalted popcorn and pretzels. Fat-free sweets. What foods are not recommended? The items listed may not be a complete list. Talk with your dietitian about what dietary choices are best for you. Grains Baked goods made with fat, such as croissants, muffins, or some breads. Dry pasta or rice meal packs. Vegetables Creamed or fried vegetables. Vegetables in a cheese sauce. Regular canned vegetables (not low-sodium or reduced-sodium). Regular canned tomato sauce and paste (not low-sodium or reduced-sodium). Regular tomato and vegetable juice (not low-sodium or reduced-sodium). [name]. Olives. Fruits Canned fruit in a light or heavy syrup. Fried fruit. Fruit in cream or butter sauce. Meat and other protein foods Fatty cuts of meat. Ribs. Fried meat. [name]. Sausage. Bologna and other processed lunch meats.  Salami. Fatback. Hotdogs. Bratwurst. Salted nuts and seeds. Canned beans with added salt. Canned or smoked fish. Whole eggs or egg yolks. Chicken or [place] with skin. Dairy Whole or 2% milk, cream, and half-and-half. Whole or full-fat cream cheese. Whole-fat or sweetened yogurt. Full-fat cheese. Nondairy creamers. Whipped toppings. Processed cheese and cheese spreads. Fats and oils Butter. Stick margarine. Lard. Shortening. Ghee. Bacon fat. Tropical oils, such as coconut, palm kernel, or palm oil. Seasoning and other foods Salted popcorn and pretzels. Onion salt, garlic salt, seasoned salt, table salt, and sea salt. Worcestershire sauce. Tartar sauce. Barbecue sauce. Teriyaki sauce. Soy sauce, including reduced-sodium. Steak sauce. Canned and packaged gravies. Fish sauce. Oyster sauce. Cocktail sauce. Horseradish that you find on the shelf. Ketchup. Mustard. Meat flavorings and tenderizers. Bouillon cubes. Hot sauce and Tabasco sauce. Premade or packaged marinades. Premade or packaged taco seasonings. Relishes. Regular salad dressings. Where to find more information:  National Heart, Lung, and [place]: https://wilson-eaton.com/  American Heart Association: www.heart.org Summary  The DASH eating plan is a healthy eating plan that has been shown to reduce high blood pressure (hypertension). It may also reduce your risk for type 2 diabetes, heart disease, and stroke.  With the DASH eating plan, you should limit salt (sodium) intake to 2,300  mg a day. If you have hypertension, you may need to reduce your sodium intake to 1,500 mg a day.  When on the DASH eating plan, aim to eat more fresh fruits and vegetables, whole grains, lean proteins, low-fat dairy, and heart-healthy fats.  Work with your health care provider or diet and nutrition specialist (dietitian) to adjust your eating plan to your individual calorie needs. This information is not intended to replace advice given to you by your health  care provider. Make sure you discuss any questions you have with your health care provider. Document Released: 02/06/2011 Document Revised: 02/11/2016 Document Reviewed: 02/11/2016 Elsevier Interactive Patient Education  [employer].

## 2018-01-19 NOTE — Addendum Note (Signed)
Addended by: Girtha Rm on: 01/19/2018 11:04 AM   Modules accepted: Level of Service

## 2018-01-20 ENCOUNTER — Other Ambulatory Visit: Payer: Self-pay | Admitting: Internal Medicine

## 2018-01-20 LAB — COMPREHENSIVE METABOLIC PANEL
ALK PHOS: 144 IU/L — AB (ref 39–117)
ALT: 53 IU/L — AB (ref 0–44)
AST: 43 IU/L — AB (ref 0–40)
Albumin/Globulin Ratio: 1.4 (ref 1.2–2.2)
Albumin: 4.3 g/dL (ref 3.5–5.5)
BUN/Creatinine Ratio: 10 (ref 9–20)
BUN: 11 mg/dL (ref 6–24)
Bilirubin Total: 0.6 mg/dL (ref 0.0–1.2)
CALCIUM: 9.4 mg/dL (ref 8.7–10.2)
CO2: 21 mmol/L (ref 20–29)
CREATININE: 1.05 mg/dL (ref 0.76–1.27)
Chloride: 103 mmol/L (ref 96–106)
GFR calc Af Amer: 93 mL/min/{1.73_m2} (ref >59)
GFR, EST NON AFRICAN AMERICAN: 80 mL/min/{1.73_m2} (ref >59)
GLUCOSE: 88 mg/dL (ref 65–99)
Globulin, Total: 3 g/dL (ref 1.5–4.5)
Potassium: 4.5 mmol/L (ref 3.5–5.2)
SODIUM: 141 mmol/L (ref 134–144)
Total Protein: 7.3 g/dL (ref 6.0–8.5)

## 2018-01-20 LAB — CBC WITH DIFFERENTIAL/PLATELET
BASOS ABS: 0 10*3/uL (ref 0.0–0.2)
Basos: 0 %
EOS (ABSOLUTE): 0.2 10*3/uL (ref 0.0–0.4)
EOS: 4 %
HEMATOCRIT: 41.2 % (ref 37.5–51.0)
Hemoglobin: 13.5 g/dL (ref 13.0–17.7)
Immature Grans (Abs): 0 10*3/uL (ref 0.0–0.1)
Immature Granulocytes: 0 %
LYMPHS ABS: 1.4 10*3/uL (ref 0.7–3.1)
Lymphs: 30 %
MCH: 27.3 pg (ref 26.6–33.0)
MCHC: 32.8 g/dL (ref 31.5–35.7)
MCV: 83 fL (ref 79–97)
MONOS ABS: 0.4 10*3/uL (ref 0.1–0.9)
Monocytes: 9 %
Neutrophils Absolute: 2.6 10*3/uL (ref 1.4–7.0)
Neutrophils: 57 %
PLATELETS: 218 10*3/uL (ref 150–450)
RBC: 4.94 x10E6/uL (ref 4.14–5.80)
RDW: 13.3 % (ref 12.3–15.4)
WBC: 4.6 10*3/uL (ref 3.4–10.8)

## 2018-01-20 LAB — LIPID PANEL
CHOL/HDL RATIO: 3 ratio (ref 0.0–5.0)
CHOLESTEROL TOTAL: 164 mg/dL (ref 100–199)
HDL: 54 mg/dL (ref >39)
LDL CALC: 95 mg/dL (ref 0–99)
TRIGLYCERIDES: 76 mg/dL (ref 0–149)
VLDL Cholesterol Cal: 15 mg/dL (ref 5–40)

## 2018-01-20 LAB — VITAMIN D 25 HYDROXY (VIT D DEFICIENCY, FRACTURES): Vit D, 25-Hydroxy: 25.8 ng/mL — ABNORMAL LOW (ref 30.0–100.0)

## 2018-01-20 MED ORDER — ATORVASTATIN CALCIUM 20 MG PO TABS: 20.0000 mg | ORAL_TABLET | Freq: Every day | ORAL | 5 refills | Status: DC

## 2018-01-24 ENCOUNTER — Other Ambulatory Visit: Payer: Self-pay | Admitting: Family Medicine

## 2018-01-26 ENCOUNTER — Ambulatory Visit (INDEPENDENT_AMBULATORY_CARE_PROVIDER_SITE_OTHER): Payer: Self-pay

## 2018-01-26 ENCOUNTER — Encounter (INDEPENDENT_AMBULATORY_CARE_PROVIDER_SITE_OTHER): Payer: Self-pay | Admitting: Orthopaedic Surgery

## 2018-01-26 ENCOUNTER — Ambulatory Visit (INDEPENDENT_AMBULATORY_CARE_PROVIDER_SITE_OTHER): Payer: 59 | Admitting: Orthopaedic Surgery

## 2018-01-26 VITALS — Ht 71.0 in | Wt 204.8 lb

## 2018-01-26 DIAGNOSIS — M533 Sacrococcygeal disorders, not elsewhere classified: Secondary | ICD-10-CM | POA: Diagnosis not present

## 2018-01-26 DIAGNOSIS — M545 Low back pain, unspecified: Secondary | ICD-10-CM | POA: Insufficient documentation

## 2018-01-26 DIAGNOSIS — G8929 Other chronic pain: Secondary | ICD-10-CM

## 2018-01-26 MED ORDER — METHOCARBAMOL 500 MG PO TABS: 500.0000 mg | ORAL_TABLET | Freq: Two times a day (BID) | ORAL | 0 refills | Status: DC | PRN

## 2018-01-26 MED ORDER — PREDNISONE 10 MG (21) PO TBPK: ORAL_TABLET | ORAL | 0 refills | Status: DC

## 2018-01-26 NOTE — Progress Notes (Signed)
Office Visit Note   Patient: Martin Murphy           Date of Birth: 05/12/1965           MRN: [id number] Visit Date: 01/26/2018              Requested by: Girtha Rm, NP-C [place], [place] [address] PCP: Girtha Rm, NP-C   Assessment & Plan: Visit Diagnoses:  1. Acute low back pain without sciatica, unspecified back pain laterality   2. Chronic right SI joint pain     Plan: Impression is right-sided SI joint pain and lumbar radiculopathy.  We will start the patient on a Sterapred taper as well as muscle relaxer.  We will also send him to formal physical therapy.  A prescription was provided the patient today for this.  He will follow-up with [place] in 6 weeks time for recheck.  If he has not improved, we will get a new MRI of the lumbar spine.  Follow-Up Instructions: Return in about 6 weeks (around 03/09/2018).   Orders:  Orders Placed This Encounter  Procedures  . XR Lumbar Spine 2-3 Views   Meds ordered this encounter  Medications  . methocarbamol (ROBAXIN) 500 MG tablet    Sig: Take 1 tablet (500 mg total) by mouth 2 (two) times daily as needed for muscle spasms.    Dispense:  20 tablet    Refill:  0  . predniSONE (STERAPRED UNI-PAK 21 TAB) 10 MG (21) TBPK tablet    Sig: Take as directed    Dispense:  21 tablet    Refill:  0      Procedures: No procedures performed   Clinical Data: No additional findings.   Subjective: Chief Complaint  Patient presents with  . Lower Back - Pain    Referred by Dr Raenette Rover; Lower Back pain S1-S5    HPI patient is a pleasant 52 year old gentleman who presents to our clinic today with right-sided lower back pain.  This is been ongoing for the past several years without any known injury or change in activity.  The pain has not worsened but it has not improved.  The pain he has is described as a constant ache worse with standing.  He has tried heat and stretching with mild improvements of symptoms.  Of note,  in the past he has been to formal physical therapy which does seem to have helped.  He denies any lower extremity weakness, bowel or bladder change or saddle paresthesias.  He does note an MRI several years ago but is unsure what this showed.  He has had 2 epidural steroid injections without relief of symptoms.  He does note that he had a right lower extremity nerve conduction study which showed neuropathy.  Review of Systems as detailed in HPI.  All others reviewed and are negative.   Objective: Vital Signs: Ht 5\' 11"  (1.803 m)   Wt 204 lb 12.8 oz (92.9 kg)   BMI 28.56 kg/m   Physical Exam well-developed well-nourished male no acute distress.  Alert and oriented x3.  Ortho Exam examination of the lumbar spine reveals increased pain with lumbar flexion.  No pain with extension.  Moderate tenderness right SI joint.  Positive straight leg raise on the right.  Negative logroll.  No focal weakness.  He is neurovascular intact distally.  Specialty Comments:  No specialty comments available.  Imaging: Xr Lumbar Spine 2-3 Views  Result Date: 01/26/2018 X-rays demonstrate abnormal straightening  of the lumbar spine    PMFS History: Patient Active Problem List   Diagnosis Date Noted  . Acute low back pain without sciatica 01/26/2018  . Chronic right SI joint pain 01/26/2018  . High serum vitamin D 01/15/2016  . Elevated LDL cholesterol level 01/15/2016  . Lumbar disc disease 07/16/2015  . Chronic lumbar pain 07/02/2015  . Pain, foot, chronic 07/02/2015  . Vitamin D deficiency 04/27/2015  . Low testosterone 04/27/2015  . Peripheral neuropathy 02/18/2015  . 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 Bunion of great toe 03/31/2014  . Liver dysfunction 12/28/2013  . HTN (hypertension) 10/28/2013  . Malignant neoplasm of prostate ([place]) 08/02/2013  . MGUS (monoclonal gammopathy of unknown significance) 05/31/2013   Past Medical History:  Diagnosis Date  . At risk  for sleep apnea    STOP-BANG=5    SENT TO PCP 10-13-2013  . Bunion    bilateral great toe  . 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 Hypertension   . Nocturia   . Prostate cancer ([place]) DX  12/20/2012   biopsies x 2, Gleason 3+3=6  vol 36  . Sarcoidosis of lung ([place])    per lung bx 1990's    Family History  Problem Relation Age of Onset  . Hypertension Brother   . Prostate cancer Maternal Uncle        1 had surgery, 1 had seeds  . Cancer Maternal Grandfather        prostate  . Diabetes Brother   . Colon cancer Neg Hx   . Esophageal cancer Neg Hx   . Stomach cancer Neg Hx   . Rectal cancer Neg Hx     Past Surgical History:  Procedure Laterality Date  . BRONCHOSCOPY  1990's   w/  biopsy's  . BUNIONECTOMY    . FOOT SURGERY     3 surgeries total  . INGUINAL HERNIA REPAIR Bilateral right  02-09-2012/   left  1990's  . PROSTATE BIOPSY  12/20/12   gleason 6  . PROSTATE BIOPSY  06/07/13   gleason 6, vol 36 gms  . RADIOACTIVE SEED IMPLANT N/A 10/20/2013   Procedure: RADIOACTIVE SEED IMPLANT;  Surgeon: Bernestine Amass, MD;  Location: [facility];  Service: Urology;  Laterality: N/A;   Social History   Occupational History  . Not on file  Tobacco Use  . Smoking status: Never Smoker  . Smokeless tobacco: Never Used  Substance and Sexual Activity  . Alcohol use: No  . Drug use: No  . Sexual activity: Yes

## 2018-03-16 ENCOUNTER — Ambulatory Visit (INDEPENDENT_AMBULATORY_CARE_PROVIDER_SITE_OTHER): Payer: 59 | Admitting: Orthopaedic Surgery

## 2018-03-16 ENCOUNTER — Encounter (INDEPENDENT_AMBULATORY_CARE_PROVIDER_SITE_OTHER): Payer: Self-pay | Admitting: Orthopaedic Surgery

## 2018-03-16 DIAGNOSIS — M5416 Radiculopathy, lumbar region: Secondary | ICD-10-CM

## 2018-03-16 NOTE — Progress Notes (Signed)
Office Visit Note   Patient: Martin Murphy           Date of Birth: 10-30-1965           MRN: [id number] Visit Date: 03/16/2018              Requested by: Girtha Rm, NP-C [place], [place] [address] PCP: Girtha Rm, NP-C   Assessment & Plan: Visit Diagnoses:  1. Lumbar radiculopathy     Plan: Impression is continued lumbar radiculopathy.  At this point patient has failed conservative treatment.  We will order MRI of the lumbar spine to rule out structural abnormalities.  I did offer another prednisone taper but patient declined.  We will see him back in about 2 weeks to review the MRI.  Follow-Up Instructions: Return in about 2 weeks (around 03/30/2018).   Orders:  No orders of the defined types were placed in this encounter.  No orders of the defined types were placed in this encounter.     Procedures: No procedures performed   Clinical Data: No additional findings.   Subjective: Chief Complaint  Patient presents with  . Lower Back - Pain    Martin Murphy follows up today for continued lumbar radiculopathy.  He had temporary relief from the prednisone taper.  Denies any focal changes in signs or symptoms.   Review of Systems   Objective: Vital Signs: There were no vitals taken for this visit.  Physical Exam  Ortho Exam Exam is stable. Specialty Comments:  No specialty comments available.  Imaging: No results found.   PMFS History: Patient Active Problem List   Diagnosis Date Noted  . Acute low back pain without sciatica 01/26/2018  . Chronic right SI joint pain 01/26/2018  . High serum vitamin D 01/15/2016  . Elevated LDL cholesterol level 01/15/2016  . Lumbar disc disease 07/16/2015  . Chronic lumbar pain 07/02/2015  . Pain, foot, chronic 07/02/2015  . Vitamin D deficiency 04/27/2015  . Low testosterone 04/27/2015  . Peripheral neuropathy 02/18/2015  . 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  Bunion of great toe 03/31/2014  . Liver dysfunction 12/28/2013  . HTN (hypertension) 10/28/2013  . Malignant neoplasm of prostate ([place]) 08/02/2013  . MGUS (monoclonal gammopathy of unknown significance) 05/31/2013   Past Medical History:  Diagnosis Date  . At risk for sleep apnea    STOP-BANG=5    SENT TO PCP 10-13-2013  . Bunion    bilateral great toe  . 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 Hypertension   . Nocturia   . Prostate cancer ([place]) DX  12/20/2012   biopsies x 2, Gleason 3+3=6  vol 36  . 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 Hypertension Brother   . Prostate cancer Maternal Uncle        1 had surgery, 1 had seeds  . Cancer Maternal Grandfather        prostate  . Diabetes Brother   . Colon cancer Neg Hx   . Esophageal cancer Neg Hx   . Stomach cancer Neg Hx   . Rectal cancer Neg Hx     Past Surgical History:  Procedure Laterality Date  . BRONCHOSCOPY  1990's   w/  biopsy's  . BUNIONECTOMY    . FOOT SURGERY     3 surgeries total  . INGUINAL HERNIA REPAIR Bilateral right  02-09-2012/   left  1990's  . PROSTATE BIOPSY  12/20/12   gleason 6  . PROSTATE BIOPSY  06/07/13   gleason 6, vol 36 gms  . RADIOACTIVE SEED IMPLANT N/A 10/20/2013   Procedure: RADIOACTIVE SEED IMPLANT;  Surgeon: Bernestine Amass, MD;  Location: [facility];  Service: Urology;  Laterality: N/A;   Social History   Occupational History  . Not on file  Tobacco Use  . Smoking status: Never Smoker  . Smokeless tobacco: Never Used  Substance and Sexual Activity  . Alcohol use: No  . Drug use: No  . Sexual activity: Yes

## 2018-03-16 NOTE — Addendum Note (Signed)
Addended by: Precious Bard on: 03/16/2018 04:55 PM   Modules accepted: Orders

## 2018-03-21 ENCOUNTER — Ambulatory Visit
Admission: RE | Admit: 2018-03-21 | Discharge: 2018-03-21 | Disposition: A | Discharge status: Home / Self Care | Payer: 59 | Source: Ambulatory Visit | Attending: Orthopaedic Surgery | Admitting: Orthopaedic Surgery

## 2018-03-21 DIAGNOSIS — M5416 Radiculopathy, lumbar region: Secondary | ICD-10-CM

## 2018-03-30 ENCOUNTER — Ambulatory Visit (INDEPENDENT_AMBULATORY_CARE_PROVIDER_SITE_OTHER): Payer: 59 | Admitting: Orthopaedic Surgery

## 2018-03-30 ENCOUNTER — Encounter (INDEPENDENT_AMBULATORY_CARE_PROVIDER_SITE_OTHER): Payer: Self-pay | Admitting: Orthopaedic Surgery

## 2018-03-30 DIAGNOSIS — M545 Low back pain, unspecified: Secondary | ICD-10-CM

## 2018-03-30 NOTE — Progress Notes (Signed)
Office Visit Note   Patient: Martin Murphy           Date of Birth: 07/18/1965           MRN: [id number] Visit Date: 03/30/2018              Requested by: Girtha Rm, NP-C [place], [place] [id number] PCP: Girtha Rm, NP-C   Assessment & Plan: Visit Diagnoses:  1. Low back pain, unspecified back pain laterality, unspecified chronicity, unspecified whether sciatica present     Plan: Impression is right lower extremity radiculopathy.  Will refer the patient to Dr. Ernestina Patches for a repeat ESI.  Have also provided him with a physical therapy prescription.  He will start this after things settle down from the injection.  Follow-up with [place] as needed.  Follow-Up Instructions: Return if symptoms worsen or fail to improve.   Orders:  Orders Placed This Encounter  Procedures  . Ambulatory referral to Physical Medicine Rehab   No orders of the defined types were placed in this encounter.     Procedures: No procedures performed   Clinical Data: No additional findings.   Subjective: Chief Complaint  Patient presents with  . Lower Back - Pain    HPI patient is a pleasant 53 year old gentleman who presents our clinic today to review MRI results of the lumbar spine.  Continued right lower extremity radiculopathy for the past several years.  He is failed conservative treatment options to include prednisone and physical therapy.  MRI results from 03/21/2018 show mild disc bulge with right-sided endplate changes at A6-T0 with moderate right L5 foraminal stenosis.  He does have mild disc bulging at L3-4 and L 4 5 as well.  He does note remote history of ESI several years ago.  Review of Systems as detailed in HPI.  All others reviewed and are negative.   Objective: Vital Signs: There were no vitals taken for this visit.  Physical Exam well-developed well-nourished gentleman in no acute distress.  Alert and oriented x3.  Ortho Exam stable lumbar  exam  Specialty Comments:  No specialty comments available.  Imaging: No new imaging   PMFS History: Patient Active Problem List   Diagnosis Date Noted  . Acute low back pain without sciatica 01/26/2018  . Chronic right SI joint pain 01/26/2018  . High serum vitamin D 01/15/2016  . Elevated LDL cholesterol level 01/15/2016  . Lumbar disc disease 07/16/2015  . Chronic lumbar pain 07/02/2015  . Pain, foot, chronic 07/02/2015  . Vitamin D deficiency 04/27/2015  . Low testosterone 04/27/2015  . Peripheral neuropathy 02/18/2015  . 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 Bunion of great toe 03/31/2014  . Liver dysfunction 12/28/2013  . HTN (hypertension) 10/28/2013  . Malignant neoplasm of prostate ([place]) 08/02/2013  . MGUS (monoclonal gammopathy of unknown significance) 05/31/2013   Past Medical History:  Diagnosis Date  . At risk for sleep apnea    STOP-BANG=5    SENT TO PCP 10-13-2013  . Bunion    bilateral great toe  . 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 Hypertension   . Nocturia   . Prostate cancer ([place]) DX  12/20/2012   biopsies x 2, Gleason 3+3=6  vol 36  . 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 Hypertension Brother   . Prostate cancer Maternal Uncle  1 had surgery, 1 had seeds  . Cancer Maternal Grandfather        prostate  . Diabetes Brother   . Colon cancer Neg Hx   . Esophageal cancer Neg Hx   . Stomach cancer Neg Hx   . Rectal cancer Neg Hx     Past Surgical History:  Procedure Laterality Date  . BRONCHOSCOPY  1990's   w/  biopsy's  . BUNIONECTOMY    . FOOT SURGERY     3 surgeries total  . INGUINAL HERNIA REPAIR Bilateral right  02-09-2012/   left  1990's  . PROSTATE BIOPSY  12/20/12   gleason 6  . PROSTATE BIOPSY  06/07/13   gleason 6, vol 36 gms  . RADIOACTIVE SEED IMPLANT N/A 10/20/2013   Procedure: RADIOACTIVE SEED IMPLANT;   Surgeon: Bernestine Amass, MD;  Location: [facility];  Service: Urology;  Laterality: N/A;   Social History   Occupational History  . Not on file  Tobacco Use  . Smoking status: Never Smoker  . Smokeless tobacco: Never Used  Substance and Sexual Activity  . Alcohol use: No  . Drug use: No  . Sexual activity: Yes

## 2018-04-02 ENCOUNTER — Ambulatory Visit: Payer: 59 | Admitting: Medical

## 2018-04-02 VITALS — BP 138/88 | HR 80 | Temp 98.1°F | Wt 212.6 lb

## 2018-04-02 DIAGNOSIS — D369 Benign neoplasm, unspecified site: Secondary | ICD-10-CM

## 2018-04-02 DIAGNOSIS — R1031 Right lower quadrant pain: Secondary | ICD-10-CM | POA: Insufficient documentation

## 2018-04-02 DIAGNOSIS — R945 Abnormal results of liver function studies: Secondary | ICD-10-CM

## 2018-04-02 DIAGNOSIS — R748 Abnormal levels of other serum enzymes: Secondary | ICD-10-CM

## 2018-04-02 DIAGNOSIS — R7989 Other specified abnormal findings of blood chemistry: Secondary | ICD-10-CM | POA: Insufficient documentation

## 2018-04-02 DIAGNOSIS — K7689 Other specified diseases of liver: Secondary | ICD-10-CM

## 2018-04-02 DIAGNOSIS — Z8546 Personal history of malignant neoplasm of prostate: Secondary | ICD-10-CM

## 2018-04-02 DIAGNOSIS — Z8719 Personal history of other diseases of the digestive system: Secondary | ICD-10-CM | POA: Insufficient documentation

## 2018-04-02 DIAGNOSIS — N5089 Other specified disorders of the male genital organs: Secondary | ICD-10-CM

## 2018-04-02 DIAGNOSIS — Z9889 Other specified postprocedural states: Secondary | ICD-10-CM | POA: Diagnosis not present

## 2018-04-02 NOTE — Progress Notes (Signed)
Subjective Chief Complaint  Patient presents with  . other    Hernia in groin area   Here for concerns  He thinks he may have a hernia.  He has a history of left open hernia repair years ago, history of right laparoscopic mesh hernia repair more recently.  Lately he has been feeling a pressure in the right lower abdomen, sometimes aches when he walks.  He will hold his hand over the lower right abdomen to give comfort.  He also feels discomfort in the right testicle.  He notes a new lump he found within the past 2 weeks.  He sees urology.  He has a history of prostate cancer.  His last visit with urology was in November 2019.  He was seeing Dr. Julien Girt but now will be seeing Dr. Lovena Neighbours as document Risa Grill is ready time  He has a history of elevated liver test without a specific cause determined.  He has seen several specialist for this in the past.  He does not drink a lot of alcohol.  He does not use a lot of pain medicine.  He is taking his atorvastatin daily.  He has no history of hepatitis.  He is married, no concern for STD.  He does not have a history of fatty liver disease.  He knows he needs to lose some weight.  Past Medical History:  Diagnosis Date  . At risk for sleep apnea    STOP-BANG=5    SENT TO PCP 10-13-2013  . Bunion    bilateral great toe  . 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 Hypertension   . Nocturia   . Prostate cancer ([place]) DX  12/20/2012   biopsies x 2, Gleason 3+3=6  vol 36  . 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 Ascorbic Acid (VITAMIN C PO) Take by mouth daily. Reported on 05/25/2015    . Cholecalciferol (VITAMIN D3 PO) Take by mouth.    . hydrochlorothiazide (HYDRODIURIL) 25 MG tablet TAKE 1 TABLET BY MOUTH EVERY DAY 30 tablet 5  . methocarbamol (ROBAXIN) 500 MG tablet Take 1 tablet (500 mg total) by mouth 2 (two) times daily as needed  for muscle spasms. 20 tablet 0  . Pyridoxine HCl (B-6 PO) Take by mouth.    . sildenafil (VIAGRA) 50 MG tablet Take 50 mg by mouth daily as needed for erectile dysfunction.    . tamsulosin (FLOMAX) 0.4 MG CAPS capsule Take 0.4 mg by mouth.    . vitamin B-12 (CYANOCOBALAMIN) 1000 MCG tablet Take 1,000 mcg by mouth daily. Reported on 05/25/2015    . predniSONE (STERAPRED UNI-PAK 21 TAB) 10 MG (21) TBPK tablet Take as directed (Patient not taking: Reported on 03/16/2018) 21 tablet 0  . scopolamine (TRANSDERM-SCOP) 1 MG/3DAYS Place 1 patch (1.5 mg total) onto the skin every 3 (three) days. (Patient not taking: Reported on 04/02/2018) 3 patch 0   No current facility-administered medications on file prior to visit.    ROS as in subjective    Objective: BP 138/88 (BP Location: Left Arm, Patient Position: Sitting)   Pulse 80   Temp 98.1 F (36.7 C)   Wt 212 lb 9.6 oz (96.4 kg)   SpO2 95%   BMI 29.65 kg/m   General appearance: alert, no distress, WD/WN, overweight AA male Abdomen: +bs, soft, there is a hernia of the umbilicus nontender and  reducible.  Abdomen is tender somewhat in the right lower quadrant/right suprapubic region but otherwise non-tender, non distended, no masses, no hepatomegaly, no splenomegaly GU: Normal male genitalia, circumcised, there is a somewhat hard 3 cm x 2 cm lump on the inferior pole of the right testicle that is nontender, no other scrotal mass palpated.  There is a left inguinal hernia linear scar from prior surgery, there is no obvious hernia on exam although there is a fullness in the right suprapubic region Pulses: 2+ symmetric, upper and lower extremities, normal cap refill   Assessment: Encounter Diagnoses  Name Primary?  . Abdominal discomfort in right lower quadrant Yes  . Testicular mass   . S/P hernia repair   . Liver dysfunction   . History of prostate cancer   . Elevated alkaline phosphatase level   . Elevated LFTs   . Tubular adenoma       Plan: Abdominal discomfort-may or may not be related to the new testicular mass.  He will go see urology first about the new testicular mass today.  But if the urology does not think it is related to his abdominal tenderness, he may need updated scan or other evaluation  Testicular mass-advised to follow-up with Dr. Lovena [facility].  He will call them since he is already an established patient there and we will send referral over  I advised that I see no obvious evidence of a new hernia other than the umbilicus hernia has been there chronically and not bothering him.  He is status post laparoscopic inguinal hernia repair on the right, status post inguinal hernia repair open on the left  History of prostate cancer-managed by urology  Elevated liver test, alkaline phosphatase, liver dysfunction- I reviewed prior records including a whole host of labs and imaging done in 2017 with subsequent GI follow-up and oncology follow-up.  It does not appear there was official diagnosis of why his tests are elevated so recommend he do a gastroenterology follow-up.  He may or may not need a liver biopsy but that is up to them.  I did ask him to stop his atorvastatin for now until he sees GI  Tubular adenoma on prior colonoscopy, due for repeat colonoscopy 2021  D/C atorvastatin for now until liver issue resolved or unless GI feels its appropriate to continue this medication  Jean was seen today for other.  Diagnoses and all orders for this visit:  Abdominal discomfort in right lower quadrant  Testicular mass -     Ambulatory referral to Urology  S/P hernia repair  Liver dysfunction  History of prostate cancer  Elevated alkaline phosphatase level  Elevated LFTs  Tubular adenoma

## 2018-04-06 ENCOUNTER — Telehealth: Payer: Self-pay

## 2018-04-06 NOTE — Telephone Encounter (Signed)
Patient has an appointment on 04-08-18 at 130 at [facility].

## 2018-04-20 ENCOUNTER — Other Ambulatory Visit (INDEPENDENT_AMBULATORY_CARE_PROVIDER_SITE_OTHER): Payer: Self-pay | Admitting: Physical Medicine and Rehabilitation

## 2018-04-20 ENCOUNTER — Telehealth (INDEPENDENT_AMBULATORY_CARE_PROVIDER_SITE_OTHER): Payer: Self-pay | Admitting: *Deleted

## 2018-04-20 DIAGNOSIS — F411 Generalized anxiety disorder: Secondary | ICD-10-CM

## 2018-04-20 MED ORDER — DIAZEPAM 5 MG PO TABS: ORAL_TABLET | ORAL | 0 refills | Status: DC

## 2018-04-20 NOTE — Telephone Encounter (Signed)
Called pt and advised.  

## 2018-04-20 NOTE — Telephone Encounter (Signed)
Done

## 2018-04-20 NOTE — Progress Notes (Signed)
Pre-procedure diazepam ordered for pre-operative anxiety.  

## 2018-04-21 ENCOUNTER — Ambulatory Visit (INDEPENDENT_AMBULATORY_CARE_PROVIDER_SITE_OTHER): Payer: 59 | Admitting: Physical Medicine and Rehabilitation

## 2018-04-21 ENCOUNTER — Encounter (INDEPENDENT_AMBULATORY_CARE_PROVIDER_SITE_OTHER): Payer: Self-pay | Admitting: Physical Medicine and Rehabilitation

## 2018-04-21 ENCOUNTER — Ambulatory Visit (INDEPENDENT_AMBULATORY_CARE_PROVIDER_SITE_OTHER): Payer: Self-pay

## 2018-04-21 VITALS — BP 121/79 | HR 85 | Temp 98.1°F

## 2018-04-21 DIAGNOSIS — M545 Low back pain: Secondary | ICD-10-CM

## 2018-04-21 DIAGNOSIS — M5416 Radiculopathy, lumbar region: Secondary | ICD-10-CM | POA: Diagnosis not present

## 2018-04-21 DIAGNOSIS — M79604 Pain in right leg: Secondary | ICD-10-CM

## 2018-04-21 MED ORDER — BETAMETHASONE SOD PHOS & ACET 6 (3-3) MG/ML IJ SUSP
12.0000 mg | Freq: Once | INTRAMUSCULAR | Status: AC
  Administered 2018-04-21: 12 mg

## 2018-04-21 NOTE — Progress Notes (Signed)
 .  Numeric Pain Rating Scale and Functional Assessment Average Pain 6   In the last MONTH (on 0-10 scale) has pain interfered with the following?  1. General activity like being  able to carry out your everyday physical activities such as walking, climbing stairs, carrying groceries, or moving a chair?  Rating(4)   +Driver, -BT, -Dye Allergies.  

## 2018-04-26 ENCOUNTER — Encounter: Payer: Self-pay | Admitting: Family Medicine

## 2018-04-26 ENCOUNTER — Ambulatory Visit: Payer: 59 | Admitting: Family Medicine

## 2018-04-26 VITALS — BP 130/76 | HR 83 | Temp 98.3°F | Resp 16 | Wt 211.6 lb

## 2018-04-26 DIAGNOSIS — R21 Rash and other nonspecific skin eruption: Secondary | ICD-10-CM | POA: Diagnosis not present

## 2018-04-26 MED ORDER — VALACYCLOVIR HCL 1 G PO TABS: 1000.0000 mg | ORAL_TABLET | Freq: Three times a day (TID) | ORAL | 0 refills | Status: DC

## 2018-04-26 NOTE — Progress Notes (Signed)
Subjective:    Patient ID: Martin Murphy, male    DOB: 27-Sep-1965, 53 y.o.   MRN: [id number]  HPI Chief Complaint  Patient presents with  . rash on head    rash on head. had shot last week for back and not sure if its a reaction . tingling burning sensation    He is here with complaints of a 4-day history of a red, painful rash on the left side of his forehead.  States initially he only had a few bumps and over the past 3 days the rash seems to be worsening.  Reports a burning sensation.  Recalls having chicken pox as a child.  Has not had a shingles vaccine.  Denies any ear pain, eye pain or drainage. No vision changes.   No new soaps, lotions, detergents. No exposure to any plants or animals.   He has not taken anything for his symptoms.   States he had 2 steroid injections for back pain last week.   Denies fever, chills, night sweats, chest pain, shortness of breath, N/V/D.   Reviewed allergies, medications, past medical, surgical, family, and social history.   Review of Systems Pertinent positives and negatives in the history of present illness.     Objective:   Physical Exam Constitutional:      General: He is not in acute distress. HENT:     Head:      Comments: Left side of his forehead with a red, papular type rash, clusters. No vesicles. Hyperesthesia to left forehead compared to right.     Right Ear: Tympanic membrane and ear canal normal.     Left Ear: Tympanic membrane and ear canal normal.     Nose: Nose normal.     Mouth/Throat:     Lips: Pink. No lesions.     Mouth: Mucous membranes are dry. No oral lesions.     Pharynx: Oropharynx is clear. Uvula midline. No pharyngeal swelling.  Eyes:     General: Lids are normal.     Extraocular Movements: Extraocular movements intact.     Conjunctiva/sclera: Conjunctivae normal.     Pupils: Pupils are equal, round, and reactive to light.  Neck:     Musculoskeletal: Full passive range of motion without pain and  neck supple.  Lymphadenopathy:     Cervical: No cervical adenopathy.  Skin:    General: Skin is warm and dry.  Neurological:     General: No focal deficit present.     Mental Status: He is alert.     Cranial Nerves: Cranial nerves are intact.     Sensory: Sensation is intact.     Motor: Motor function is intact.     Coordination: Coordination is intact.    BP 130/76   Pulse 83   Temp 98.3 F (36.8 C) (Oral)   Resp 16   Wt 211 lb 9.6 oz (96 kg)   SpO2 98%   BMI 29.51 kg/m       Assessment & Plan:  Rash of face - Plan: valACYclovir (VALTREX) 1000 MG tablet  Presumed shingles rash although it is not classic in it's presentation. Hyperesthesia of the rash speaks to a shingles outbreak as well.  Will treat him with Valtrex and also have him try OTC antihistamine. No sign of any ocular or ear involvement. Discussed urgent nature of ocular involvement. No sign of infection but discussed if this is an early cellulitis that it will get worse.  He may use OTC  pain medication and let me know if this is not controlling his pain.  A work note was provided for tomorrow and Wednesday and he will follow up with me Wednesday. We took a photo with his phone in order to compare at the f/u appt.

## 2018-04-26 NOTE — Patient Instructions (Signed)
Take the Valtrex as prescribed. You may also want to try taking Benadryl at bedtime.   Use cool compresses and try ibuprofen or Aleve for pain. If you cannot tolerate these medications, you may want to try extra strength Tylenol.   Return to see me in 2 -3 days or sooner if you feel like you are getting much worse.    Shingles  Shingles is an infection. It gives you a painful skin rash and blisters that have fluid in them. Shingles is caused by the same germ (virus) that causes chickenpox. Shingles only happens in people who:  Have had chickenpox.  Have been given a shot of medicine (vaccine) to protect against chickenpox. Shingles is rare in this group. The first symptoms of shingles may be itching, tingling, or pain in an area on your skin. A rash will show on your skin a few days or weeks later. The rash is likely to be on one side of your body. The rash usually has a shape like a belt or a band. Over time, the rash turns into fluid-filled blisters. The blisters will break open, change into scabs, and dry up. Medicines may:  Help with pain and itching.  Help you get better sooner.  Help to prevent long-term problems. Follow these instructions at home: Medicines  Take over-the-counter and prescription medicines only as told by your doctor.  Put on an anti-itch cream or numbing cream where you have a rash, blisters, or scabs. Do this as told by your doctor. Helping with itching and discomfort   Put cold, wet cloths (cold compresses) on the area of the rash or blisters as told by your doctor.  Cool baths can help you feel better. Try adding baking soda or dry oatmeal to the water to lessen itching. Do not bathe in hot water. Blister and rash care  Keep your rash covered with a loose bandage (dressing).  Wear loose clothing that does not rub on your rash.  Keep your rash and blisters clean. To do this, wash the area with mild soap and cool water as told by your doctor.  Check  your rash every day for signs of infection. Check for: ? More redness, swelling, or pain. ? Fluid or blood. ? Warmth. ? Pus or a bad smell.  Do not scratch your rash. Do not pick at your blisters. To help you to not scratch: ? Keep your fingernails clean and cut short. ? Wear gloves or mittens when you sleep, if scratching is a problem. General instructions  Rest as told by your doctor.  Keep all follow-up visits as told by your doctor. This is important.  Wash your hands often with soap and water. If soap and water are not available, use hand sanitizer. Doing this lowers your chance of getting a skin infection caused by germs (bacteria).  Your infection can cause chickenpox in people who have never had chickenpox or never got a shot of chickenpox vaccine. If you have blisters that did not change into scabs yet, try not to touch other people or be around other people, especially: ? Babies. ? Pregnant women. ? Children who have areas of red, itchy, or rough skin (eczema). ? Very old people who have transplants. ? People who have a long-term (chronic) sickness, like cancer or AIDS. Contact a doctor if:  Your pain does not get better with medicine.  Your pain does not get better after the rash heals.  You have any signs of infection in the  rash area. These signs include: ? More redness, swelling, or pain around the rash. ? Fluid or blood coming from the rash. ? The rash area feeling warm to the touch. ? Pus or a bad smell coming from the rash. Get help right away if:  The rash is on your face or nose.  You have pain in your face or pain by your eye.  You lose feeling on one side of your face.  You have trouble seeing.  You have ear pain, or you have ringing in your ear.  You have a loss of taste.  Your condition gets worse. Summary  Shingles gives you a painful skin rash and blisters that have fluid in them.  Shingles is an infection. It is caused by the same germ  (virus) that causes chickenpox.  Keep your rash covered with a loose bandage (dressing). Wear loose clothing that does not rub on your rash.  If you have blisters that did not change into scabs yet, try not to touch other people or be around people. This information is not intended to replace advice given to you by your health care provider. Make sure you discuss any questions you have with your health care provider. Document Released: 08/06/2007 Document Revised: 10/22/2016 Document Reviewed: 10/22/2016 Elsevier Interactive Patient Education  2019 [employer].

## 2018-04-28 ENCOUNTER — Ambulatory Visit: Payer: 59 | Admitting: Family Medicine

## 2018-04-28 ENCOUNTER — Encounter: Payer: Self-pay | Admitting: Family Medicine

## 2018-04-28 VITALS — BP 120/70 | HR 78 | Temp 98.4°F

## 2018-04-28 DIAGNOSIS — B029 Zoster without complications: Secondary | ICD-10-CM | POA: Diagnosis not present

## 2018-04-28 NOTE — Progress Notes (Signed)
   Subjective:    Patient ID: Martin Murphy, male    DOB: [birth year], 53 y.o.   MRN: [id number]  HPI Chief Complaint  Patient presents with  . follow-up    follow-up. has moved to his side of nose near eye. and more blisters   He is here for a 2 day follow up on a rash on his left forehead that was presumed shingles. The rash has improved somewhat but he does not have a small cluster on the left side of his nose. Reports painful rash with burning and increased sensitivity of his skin.   He is taking Valtrex as prescribed, a 7 day course.  No new complaints of concerns. No eye or ear concerns.   Denies fever, chills, headache, dizziness, vision changes, ear pain, sore throat, N/V/D.   Reviewed allergies, medications, past medical, surgical, family, and social history.    Review of Systems Pertinent positives and negatives in the history of present illness.     Objective:   Physical Exam BP 120/70   Pulse 78   Temp 98.4 F (36.9 C) (Oral)   SpO2 98%   Rash is now well defined clusters of erythematous papular lesion on his left forehead and the side of his nose. Inflammation decreased.  Normal left ear and eye exam.       Assessment & Plan:  Herpes zoster without complication  Rash is more defined today and more consistent with shingles. Dr. Redmond School also examined patient and agrees with assessment and plan of care. No sign of infection.  Continue on Valtrex and take ibuprofen as needed, he does not report a lot of pain.  He may return to work tomorrow but did advised him to avoid pregnant women, babies who have not been vaccinated or older adults who have never had chicken pox.  Follow up as needed.

## 2018-05-02 NOTE — Procedures (Signed)
Lumbosacral Transforaminal Epidural Steroid Injection - Sub-Pedicular Approach with Fluoroscopic Guidance  Patient: Martin Murphy      Date of Birth: Sep 12, 1965 MRN: [id number] PCP: Girtha Rm, NP-C      Visit Date: 04/21/2018   Universal Protocol:    Date/Time: 04/21/2018  Consent Given By: the patient  Position: PRONE  Additional Comments: Vital signs were monitored before and after the procedure. Patient was prepped and draped in the usual sterile fashion. The correct patient, procedure, and site was verified.   Injection Procedure Details:  Procedure Site One Meds Administered:  Meds ordered this encounter  Medications  . betamethasone acetate-betamethasone sodium phosphate (CELESTONE) injection 12 mg    Laterality: Right  Location/Site:  L5-S1  Needle size: 22 G  Needle type: Spinal  Needle Placement: Transforaminal  Findings:    -Comments: Excellent flow of contrast along the nerve and into the epidural space.  Procedure Details: After squaring off the end-plates to get a true AP view, the C-arm was positioned so that an oblique view of the foramen as noted above was visualized. The target area is just inferior to the "nose of the scotty dog" or sub pedicular. The soft tissues overlying this structure were infiltrated with 2-3 ml. of 1% Lidocaine without Epinephrine.  The spinal needle was inserted toward the target using a "trajectory" view along the fluoroscope beam.  Under AP and lateral visualization, the needle was advanced so it did not puncture dura and was located close the 6 O'Clock position of the pedical in AP tracterory. Biplanar projections were used to confirm position. Aspiration was confirmed to be negative for CSF and/or blood. A 1-2 ml. volume of Isovue-250 was injected and flow of contrast was noted at each level. Radiographs were obtained for documentation purposes.   After attaining the desired flow of contrast documented above, a 0.5  to 1.0 ml test dose of 0.25% Marcaine was injected into each respective transforaminal space.  The patient was observed for 90 seconds post injection.  After no sensory deficits were reported, and normal lower extremity motor function was noted,   the above injectate was administered so that equal amounts of the injectate were placed at each foramen (level) into the transforaminal epidural space.   Additional Comments:  The patient tolerated the procedure well Dressing: 2 x 2 sterile gauze and Band-Aid    Post-procedure details: Patient was observed during the procedure. Post-procedure instructions were reviewed.  Patient left the clinic in stable condition.

## 2018-05-02 NOTE — Progress Notes (Signed)
[name] - 53 y.o. male MRN [id number]  Date of birth: 09/20/1965  Office Visit Note: Visit Date: 04/21/2018 PCP: Girtha Rm, NP-C Referred by: Girtha Rm, NP-C  Subjective: Chief Complaint  Patient presents with  . Lower Back - Pain  . Right Leg - Pain  . Left Leg - Pain   HPI: Martin Murphy is a 53 y.o. male who comes in today At the request of Dr. Eduard Roux for lumbar epidural injection.  Chronic long-term history of back pain and hip and leg pain bilaterally mainly nondermatomal.  He reports approximately 7 years of chronic worsening progressive pain across the lower back that radiates into both legs all the way down his feet in a nondermatomal fashion.  He reports standing for too long makes it worse.  He does get some relief with stretching.  Interestingly he has had epidural injections in the past which were somewhat beneficial and this was in 2013.  He has had myelograms and MRIs.  Current MRI is reviewed and there is degenerative changes with small disc bulging and arthritis but no focal compression no focal stenosis.  He has had electrodiagnostic study by Dr. Floyde Parkins with early axonal neuropathy.  We are going to complete diagnostic and hopefully therapeutic bilateral transforaminal epidural steroid injection on the right side at L5.  There is moderate narrowing of the foramen.  He clearly has more right-sided than left-sided symptoms even though he has bilateral complaints  ROS Otherwise per HPI.  Assessment & Plan: Visit Diagnoses:  1. Lumbar radiculopathy     Plan: No additional findings.   Meds & Orders:  Meds ordered this encounter  Medications  . betamethasone acetate-betamethasone sodium phosphate (CELESTONE) injection 12 mg    Orders Placed This Encounter  Procedures  . XR C-ARM NO REPORT  . Epidural Steroid injection    Follow-up: No follow-ups on file.   Procedures: No procedures performed  Lumbosacral Transforaminal Epidural  Steroid Injection - Sub-Pedicular Approach with Fluoroscopic Guidance  Patient: Martin Murphy      Date of Birth: 02-21-1966 MRN: [id number] PCP: Girtha Rm, NP-C      Visit Date: 04/21/2018   Universal Protocol:    Date/Time: 04/21/2018  Consent Given By: the patient  Position: PRONE  Additional Comments: Vital signs were monitored before and after the procedure. Patient was prepped and draped in the usual sterile fashion. The correct patient, procedure, and site was verified.   Injection Procedure Details:  Procedure Site One Meds Administered:  Meds ordered this encounter  Medications  . betamethasone acetate-betamethasone sodium phosphate (CELESTONE) injection 12 mg    Laterality: Right  Location/Site:  L5-S1  Needle size: 22 G  Needle type: Spinal  Needle Placement: Transforaminal  Findings:    -Comments: Excellent flow of contrast along the nerve and into the epidural space.  Procedure Details: After squaring off the end-plates to get a true AP view, the C-arm was positioned so that an oblique view of the foramen as noted above was visualized. The target area is just inferior to the "nose of the scotty dog" or sub pedicular. The soft tissues overlying this structure were infiltrated with 2-3 ml. of 1% Lidocaine without Epinephrine.  The spinal needle was inserted toward the target using a "trajectory" view along the fluoroscope beam.  Under AP and lateral visualization, the needle was advanced so it did not puncture dura and was located close the 6 O'Clock position of the pedical in  AP tracterory. Biplanar projections were used to confirm position. Aspiration was confirmed to be negative for CSF and/or blood. A 1-2 ml. volume of Isovue-250 was injected and flow of contrast was noted at each level. Radiographs were obtained for documentation purposes.   After attaining the desired flow of contrast documented above, a 0.5 to 1.0 ml test dose of 0.25% Marcaine  was injected into each respective transforaminal space.  The patient was observed for 90 seconds post injection.  After no sensory deficits were reported, and normal lower extremity motor function was noted,   the above injectate was administered so that equal amounts of the injectate were placed at each foramen (level) into the transforaminal epidural space.   Additional Comments:  The patient tolerated the procedure well Dressing: 2 x 2 sterile gauze and Band-Aid    Post-procedure details: Patient was observed during the procedure. Post-procedure instructions were reviewed.  Patient left the clinic in stable condition.     Clinical History: MRI LUMBAR SPINE WITHOUT CONTRAST  TECHNIQUE: Multiplanar, multisequence MR imaging of the lumbar spine was performed. No intravenous contrast was administered.  COMPARISON: Comparison made with prior radiograph from 01/26/2018.  FINDINGS: Segmentation: Standard. Lowest well-formed disc labeled the L5-S1 level.  Alignment: Trace 3 mm retrolisthesis of L3 on L4 and L4 on L5. Alignment otherwise normal with preservation of the normal lumbar lordosis.  Vertebrae: Vertebral body heights well maintained without evidence for acute or chronic fracture. Bone marrow signal intensity within normal limits. No discrete or worrisome osseous lesions. No abnormal marrow edema.  Conus medullaris and cauda equina: Conus extends to the L1 level. Conus and cauda equina appear normal.  Paraspinal and other soft tissues: Paraspinous soft tissues within normal limits. Several scattered T2 hyperintense simple cyst noted within the kidneys bilaterally. Visualized visceral structures otherwise unremarkable.  Disc levels:  Lumbar spine is image from the T11-12 level inferiorly through the sacrum. No significant findings are seen through the L2-3 level.  L3-4: Retrolisthesis. Mild diffuse disc bulge with disc desiccation and intervertebral  disc space narrowing. Mild bilateral facet and ligament flavum hypertrophy. Trace bilateral joint effusions. No significant canal or neural foraminal stenosis. No impingement.  L4-5: Retrolisthesis. Mild diffuse disc bulge with disc desiccation and intervertebral disc space narrowing. Mild to moderate facet and ligament flavum hypertrophy. Trace bilateral joint effusions. Borderline mild left lateral recess stenosis without significant canal narrowing. Foramina remain widely patent. No impingement.  L5-S1: Mild annular disc bulge with disc desiccation. Associated chronic right far lateral reactive endplate changes with marginal endplate osteophytic spurring. No significant canal or lateral recess stenosis. Moderate right L5 foraminal narrowing (series 4, image 4). No significant left foraminal encroachment.  IMPRESSION: 1. Mild disc bulging with right-sided reactive endplate changes at O5-D6 with resultant moderate right L5 foraminal stenosis. 2. Additional mild noncompressive disc bulging at L3-4 and L4-5 without significant stenosis or neural impingement.   Electronically Signed By: Jeannine Boga M.D. On: 03/21/2018 21:30 ---------------------- IMPRESSION:  Nerve conduction studies done on both lower extremities were relatively unremarkable with exception of absence of the medial and lateral plantar sensory latencies. The motor amplitudes for the right peroneal and posterior tibial nerves were borderline normal. EMG evaluation of both lower extremities shows mild distal chronic signs of denervation without evidence of an overlying lumbosacral radiculopathy. 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 MGUS may be associated with neuropathy syndromes. Clinical correlation is required.  Jill Alexanders MD 09/20/2014 4:36 PM  [facility] [address] [place], [place] [address]   He  reports that he has never smoked. He has never used smokeless tobacco. No results for input(s): HGBA1C, LABURIC in the last 8760 hours.  Objective:  VS:  HT:    WT:   BMI:     BP:121/79  HR:85bpm  TEMP:98.1 F (36.7 C)(Oral)  RESP:  Physical Exam  Ortho Exam Imaging: No results found.  Past Medical/Family/Surgical/Social History: Medications & Allergies reviewed per EMR, new medications updated. Patient Active Problem List   Diagnosis Date Noted  . Abdominal discomfort in right lower quadrant 04/02/2018  . Testicular mass 04/02/2018  . History of prostate cancer 04/02/2018  . S/P hernia repair 04/02/2018  . Elevated LFTs 04/02/2018  . Elevated alkaline phosphatase level 04/02/2018  . Tubular adenoma 04/02/2018  . Acute low back pain without sciatica 01/26/2018  . Chronic right SI joint pain 01/26/2018  . High serum vitamin D 01/15/2016  . Elevated LDL cholesterol level 01/15/2016  . Lumbar disc disease 07/16/2015  . Chronic lumbar pain 07/02/2015  . Pain, foot, chronic 07/02/2015  . Vitamin D deficiency 04/27/2015  . Low testosterone 04/27/2015  . Peripheral neuropathy 02/18/2015  . 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 Bunion of great toe 03/31/2014  . Liver dysfunction 12/28/2013  . HTN (hypertension) 10/28/2013  . Malignant neoplasm of prostate ([place]) 08/02/2013  . MGUS (monoclonal gammopathy of unknown significance) 05/31/2013   Past Medical History:  Diagnosis Date  . At risk for sleep apnea    STOP-BANG=5    SENT TO PCP 10-13-2013  . Bunion    bilateral great toe  . 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 Hypertension   . Nocturia   . Prostate cancer ([place]) DX  12/20/2012   biopsies x 2, Gleason 3+3=6  vol 36  . Sarcoidosis of lung ([place])    per lung bx 1990's   Family History  Problem Relation Age of Onset  . Hypertension Brother   . Prostate cancer Maternal Uncle        1 had surgery, 1 had seeds  . Cancer  Maternal Grandfather        prostate  . Diabetes Brother   . Colon cancer Neg Hx   . Esophageal cancer Neg Hx   . Stomach cancer Neg Hx   . Rectal cancer Neg Hx    Past Surgical History:  Procedure Laterality Date  . BRONCHOSCOPY  1990's   w/  biopsy's  . BUNIONECTOMY    . FOOT SURGERY     3 surgeries total  . INGUINAL HERNIA REPAIR Bilateral right  02-09-2012/   left  1990's  . PROSTATE BIOPSY  12/20/12   gleason 6  . PROSTATE BIOPSY  06/07/13   gleason 6, vol 36 gms  . RADIOACTIVE SEED IMPLANT N/A 10/20/2013   Procedure: RADIOACTIVE SEED IMPLANT;  Surgeon: Bernestine Amass, MD;  Location: [facility];  Service: Urology;  Laterality: N/A;   Social History   Occupational History  . Not on file  Tobacco Use  . Smoking status: Never Smoker  . Smokeless tobacco: Never Used  Substance and Sexual Activity  . Alcohol use: No  . Drug use: No  . Sexual activity: Yes

## 2018-05-09 NOTE — Progress Notes (Signed)
Subjective:    Patient ID: Martin Murphy, male    DOB: August 24, 1965, 53 y.o.   MRN: [id number]  HPI Chief Complaint  Patient presents with  . cpe    fasting cpe, needs a work note to go back. has to be 100% clear   He is here for a complete physical exam. Last CPE: 2017   States his employer did not allow him to go back to work after I cleared him on the 04/29/2018. States he has still not gone back to work yet.   Other providers: Urologist- Dr Risa Grill and now Dr. Lovena Neighbours. He saw him in November and will see him again in May.  Pain management- Dr Naaman Plummer in the past. Dr. Erlinda Hong at [facility] for pain now.  Podiatrist- Dr. Jacqualyn Posey- no longer seeing him  Neurologist- Dr. Jannifer Franklin nerve conduction studies in the past. Not currently seeing them.  Oncology- [facility] Dr. Earlie Server. Last there one year ago. States he was cleared by them.  GI- Dr. Havery Moros    BP- reports good medication compliance and no side effects.  Taking atorvastatin 20 mg daily. No side effects.  Monitoring liver function.   Vitamin d deficiency -taking a daily supplement.  He is also taking a daily vitamin B6 and vitamin B12 supplement.  Reports ongoing fatigue but not worsening. Reports issues falling asleep and staying asleep.  He is getting approximately 6 hours of sleep per night.  This is chronic.  He has tried melatonin in the past.  States it works "too well"   Denies smoking, drinking alcohol, drug use Diet: unhealthy  Exercise: none. He did join a gym  Immunizations: declines flu. Tdap UTD   Health maintenance:  Colonoscopy: 2016 and due in 2021.  Last PSA: followed by urology Last Dental Exam: every 6 months  Last Eye Exam: late 2019   Declines STD testing   His son is 75 years old. 2 step kids.   Wears seatbelt always, smoke detectors in home and functioning, does not text while driving, feels safe in home environment.  Reviewed allergies, medications, past medical, surgical, family, and social  history.   Review of Systems Review of Systems Constitutional: -fever, -chills, -sweats, -unexpected weight change,+fatigue ENT: -runny nose, -ear pain, -sore throat Cardiology:  -chest pain, -palpitations, -edema Respiratory: -cough, -shortness of breath, -wheezing Gastroenterology: -abdominal pain, -nausea, -vomiting, -diarrhea, -constipation  Hematology: -bleeding or bruising problems Musculoskeletal: -arthralgias, -myalgias, -joint swelling, + chronic back pain Ophthalmology: -vision changes Urology: -dysuria, -difficulty urinating, -hematuria, -urinary frequency, -urgency Neurology: -headache, -weakness, -tingling, -numbness       Objective:   Physical Exam BP 120/74   Pulse 83   Ht 5\' 11"  (1.803 m)   Wt 210 lb (95.3 kg)   BMI 29.29 kg/m   General Appearance:    Alert, cooperative, no distress, appears stated age  Head:    Normocephalic, without obvious abnormality, atraumatic  Eyes:    PERRL, conjunctiva/corneas clear, EOM's intact, fundi    benign  Ears:    Normal TM's and external ear canals  Nose:   Nares normal, mucosa normal, no drainage or sinus   tenderness  Throat:   Lips, mucosa, and tongue normal; teeth and gums normal  Neck:   Supple, no lymphadenopathy;  thyroid:  no   enlargement/tenderness/nodules; no carotid   bruit or JVD  Back:    Spine nontender, no curvature, ROM normal, no CVA     tenderness  Lungs:     Clear to  auscultation bilaterally without wheezes, rales or     ronchi; respirations unlabored  Chest Wall:    No tenderness or deformity   Heart:    Regular rate and rhythm, S1 and S2 normal, no murmur, rub   or gallop  Breast Exam:    No chest wall tenderness, masses or gynecomastia  Abdomen:     Soft, non-tender, nondistended, normoactive bowel sounds,    no masses, no hepatosplenomegaly  Genitalia:   Urology   Rectal:   Urology  Extremities:   No clubbing, cyanosis or edema  Pulses:   2+ and symmetric all extremities  Skin:   Skin color,  texture, turgor normal, no rashes or lesions  Lymph nodes:   Cervical, supraclavicular, and axillary nodes normal  Neurologic:   CNII-XII intact, normal strength, sensation and gait; reflexes 2+ and symmetric throughout          Psych:   Normal mood, affect, hygiene and grooming.     Urinalysis dipstick: Unable to leave specimen.      Assessment & Plan:  Routine general medical examination at a health care facility - Plan: CBC with Differential/Platelet, Comprehensive metabolic panel, TSH, T4, free, Lipid panel  Essential hypertension  History of prostate cancer  Immunization counseling  MGUS (monoclonal gammopathy of unknown significance)  Liver dysfunction  Vitamin D deficiency - Plan: VITAMIN D 25 Hydroxy (Vit-D Deficiency, Fractures)  Elevated LDL cholesterol level - Plan: Lipid panel  Medication management - Plan: Vitamin B12  Chronic fatigue - Plan: Vitamin B12  He is here for his fasting CPE.  Reports doing well emotionally and physically. Blood pressures under good control.  Continue on current medication. Reports good daily compliance with statin and no side effects.  Continue this. Counseling on healthy diet and cutting back on carbohydrates.  Also discussed getting at least 150 minutes of physical activity per week. Chronic elevated liver tests.  Recheck these today. He is taking daily vitamin D and vitamin B12 supplement for chronic fatigue.  Recheck vitamin D and B12 levels today.  Adjust medication dose as appropriate. Declines flu shot.  Tdap is up-to-date.  Recent shingles rash has resolved.  He was cleared to go back to work on February 27 however he states his employer would not let him return due to rash on his forehead.  States he is still not gone back to work. Follow-up pending labs

## 2018-05-10 ENCOUNTER — Ambulatory Visit: Payer: 59 | Admitting: Family Medicine

## 2018-05-10 ENCOUNTER — Encounter: Payer: Self-pay | Admitting: Family Medicine

## 2018-05-10 VITALS — BP 120/74 | HR 83 | Ht 71.0 in | Wt 210.0 lb

## 2018-05-10 DIAGNOSIS — R5382 Chronic fatigue, unspecified: Secondary | ICD-10-CM

## 2018-05-10 DIAGNOSIS — Z8546 Personal history of malignant neoplasm of prostate: Secondary | ICD-10-CM | POA: Diagnosis not present

## 2018-05-10 DIAGNOSIS — Z79899 Other long term (current) drug therapy: Secondary | ICD-10-CM

## 2018-05-10 DIAGNOSIS — Z0000 Encounter for general adult medical examination without abnormal findings: Secondary | ICD-10-CM | POA: Diagnosis not present

## 2018-05-10 DIAGNOSIS — D472 Monoclonal gammopathy: Secondary | ICD-10-CM

## 2018-05-10 DIAGNOSIS — Z7189 Other specified counseling: Secondary | ICD-10-CM

## 2018-05-10 DIAGNOSIS — K7689 Other specified diseases of liver: Secondary | ICD-10-CM

## 2018-05-10 DIAGNOSIS — E7800 Pure hypercholesterolemia, unspecified: Secondary | ICD-10-CM

## 2018-05-10 DIAGNOSIS — I10 Essential (primary) hypertension: Secondary | ICD-10-CM

## 2018-05-10 DIAGNOSIS — Z7185 Encounter for immunization safety counseling: Secondary | ICD-10-CM

## 2018-05-10 DIAGNOSIS — E559 Vitamin D deficiency, unspecified: Secondary | ICD-10-CM

## 2018-05-10 NOTE — Patient Instructions (Addendum)
Your blood pressure is in goal range today at 120/74.  Continue on your medication.  Cut back on sweets, carbohydrates such as potatoes, pasta, rice, and bread. Make sure you are getting at least 150 minutes of physical activity each week.  We will forward your lab results.   Preventive Care 40-64 Years, Male Preventive care refers to lifestyle choices and visits with your health care provider that can promote health and wellness. What does preventive care include?   A yearly physical exam. This is also called an annual well check.  Dental exams once or twice a year.  Routine eye exams. Ask your health care provider how often you should have your eyes checked.  Personal lifestyle choices, including: ? Daily care of your teeth and gums. ? Regular physical activity. ? Eating a healthy diet. ? Avoiding tobacco and drug use. ? Limiting alcohol use. ? Practicing safe sex. ? Taking low-dose aspirin every day starting at age 48. What happens during an annual well check? The services and screenings done by your health care provider during your annual well check will depend on your age, overall health, lifestyle risk factors, and family history of disease. Counseling Your health care provider may ask you questions about your:  Alcohol use.  Tobacco use.  Drug use.  Emotional well-being.  Home and relationship well-being.  Sexual activity.  Eating habits.  Work and work Statistician. Screening You may have the following tests or measurements:  Height, weight, and BMI.  Blood pressure.  Lipid and cholesterol levels. These may be checked every 5 years, or more frequently if you are over 77 years old.  Skin check.  Lung cancer screening. You may have this screening every year starting at age 45 if you have a 30-pack-year history of smoking and currently smoke or have quit within the past 15 years.  Colorectal cancer screening. All adults should have this screening  starting at age 73 and continuing until age 48. Your health care provider may recommend screening at age 75. You will have tests every 1-10 years, depending on your results and the type of screening test. People at increased risk should start screening at an earlier age. Screening tests may include: ? Guaiac-based fecal occult blood testing. ? Fecal immunochemical test (FIT). ? Stool DNA test. ? Virtual colonoscopy. ? Sigmoidoscopy. During this test, a flexible tube with a tiny camera (sigmoidoscope) is used to examine your rectum and lower colon. The sigmoidoscope is inserted through your anus into your rectum and lower colon. ? Colonoscopy. During this test, a long, thin, flexible tube with a tiny camera (colonoscope) is used to examine your entire colon and rectum.  Prostate cancer screening. Recommendations will vary depending on your family history and other risks.  Hepatitis C blood test.  Hepatitis B blood test.  Sexually transmitted disease (STD) testing.  Diabetes screening. This is done by checking your blood sugar (glucose) after you have not eaten for a while (fasting). You may have this done every 1-3 years. Discuss your test results, treatment options, and if necessary, the need for more tests with your health care provider. Vaccines Your health care provider may recommend certain vaccines, such as:  Influenza vaccine. This is recommended every year.  Tetanus, diphtheria, and acellular pertussis (Tdap, Td) vaccine. You may need a Td booster every 10 years.  Varicella vaccine. You may need this if you have not been vaccinated.  Zoster vaccine. You may need this after age 96.  Measles, mumps, and rubella (  MMR) vaccine. You may need at least one dose of MMR if you were born in 1957 or later. You may also need a second dose.  Pneumococcal 13-valent conjugate (PCV13) vaccine. You may need this if you have certain conditions and have not been vaccinated.  Pneumococcal  polysaccharide (PPSV23) vaccine. You may need one or two doses if you smoke cigarettes or if you have certain conditions.  Meningococcal vaccine. You may need this if you have certain conditions.  Hepatitis A vaccine. You may need this if you have certain conditions or if you travel or work in places where you may be exposed to hepatitis A.  Hepatitis B vaccine. You may need this if you have certain conditions or if you travel or work in places where you may be exposed to hepatitis B.  Haemophilus influenzae type b (Hib) vaccine. You may need this if you have certain risk factors. Talk to your health care provider about which screenings and vaccines you need and how often you need them. This information is not intended to replace advice given to you by your health care provider. Make sure you discuss any questions you have with your health care provider. Document Released: 03/16/2015 Document Revised: 04/09/2017 Document Reviewed: 12/19/2014 Elsevier Interactive Patient Education  2019 [employer].

## 2018-05-11 LAB — CBC WITH DIFFERENTIAL/PLATELET
Basophils Absolute: 0 10*3/uL (ref 0.0–0.2)
Basos: 0 %
EOS (ABSOLUTE): 0.2 10*3/uL (ref 0.0–0.4)
Eos: 3 %
Hematocrit: 40.9 % (ref 37.5–51.0)
Hemoglobin: 14 g/dL (ref 13.0–17.7)
Immature Grans (Abs): 0 10*3/uL (ref 0.0–0.1)
Immature Granulocytes: 0 %
Lymphocytes Absolute: 1.5 10*3/uL (ref 0.7–3.1)
Lymphs: 30 %
MCH: 28.7 pg (ref 26.6–33.0)
MCHC: 34.2 g/dL (ref 31.5–35.7)
MCV: 84 fL (ref 79–97)
MONOCYTES: 11 %
Monocytes Absolute: 0.5 10*3/uL (ref 0.1–0.9)
Neutrophils Absolute: 2.7 10*3/uL (ref 1.4–7.0)
Neutrophils: 56 %
Platelets: 195 10*3/uL (ref 150–450)
RBC: 4.88 x10E6/uL (ref 4.14–5.80)
RDW: 14 % (ref 11.6–15.4)
WBC: 4.9 10*3/uL (ref 3.4–10.8)

## 2018-05-11 LAB — COMPREHENSIVE METABOLIC PANEL
ALT: 43 IU/L (ref 0–44)
AST: 35 IU/L (ref 0–40)
Albumin/Globulin Ratio: 1.4 (ref 1.2–2.2)
Albumin: 4.4 g/dL (ref 3.8–4.9)
Alkaline Phosphatase: 142 IU/L — ABNORMAL HIGH (ref 39–117)
BUN/Creatinine Ratio: 12 (ref 9–20)
BUN: 14 mg/dL (ref 6–24)
Bilirubin Total: 0.4 mg/dL (ref 0.0–1.2)
CO2: 26 mmol/L (ref 20–29)
CREATININE: 1.17 mg/dL (ref 0.76–1.27)
Calcium: 9.8 mg/dL (ref 8.7–10.2)
Chloride: 99 mmol/L (ref 96–106)
GFR calc Af Amer: 81 mL/min/{1.73_m2} (ref >59)
GFR calc non Af Amer: 70 mL/min/{1.73_m2} (ref >59)
Globulin, Total: 3.1 g/dL (ref 1.5–4.5)
Glucose: 103 mg/dL — ABNORMAL HIGH (ref 65–99)
Potassium: 4.7 mmol/L (ref 3.5–5.2)
Sodium: 139 mmol/L (ref 134–144)
Total Protein: 7.5 g/dL (ref 6.0–8.5)

## 2018-05-11 LAB — LIPID PANEL
CHOLESTEROL TOTAL: 186 mg/dL (ref 100–199)
Chol/HDL Ratio: 3.3 ratio (ref 0.0–5.0)
HDL: 57 mg/dL (ref >39)
LDL Calculated: 113 mg/dL — ABNORMAL HIGH (ref 0–99)
Triglycerides: 81 mg/dL (ref 0–149)
VLDL Cholesterol Cal: 16 mg/dL (ref 5–40)

## 2018-05-11 LAB — VITAMIN D 25 HYDROXY (VIT D DEFICIENCY, FRACTURES): Vit D, 25-Hydroxy: 70 ng/mL (ref 30.0–100.0)

## 2018-05-11 LAB — T4, FREE: Free T4: 0.97 ng/dL (ref 0.82–1.77)

## 2018-05-11 LAB — TSH: TSH: 2.11 u[IU]/mL (ref 0.450–4.500)

## 2018-05-11 LAB — VITAMIN B12: Vitamin B-12: 1797 pg/mL — ABNORMAL HIGH (ref 232–1245)

## 2018-05-25 ENCOUNTER — Other Ambulatory Visit: Payer: Self-pay | Admitting: Family Medicine

## 2018-08-02 ENCOUNTER — Encounter: Payer: Self-pay | Admitting: Family Medicine

## 2018-09-05 ENCOUNTER — Other Ambulatory Visit: Payer: Self-pay | Admitting: Family Medicine

## 2018-10-06 ENCOUNTER — Encounter (HOSPITAL_COMMUNITY): Payer: Self-pay | Admitting: Emergency Medicine

## 2018-10-06 ENCOUNTER — Emergency Department (HOSPITAL_COMMUNITY): Payer: 59

## 2018-10-06 ENCOUNTER — Other Ambulatory Visit: Payer: Self-pay

## 2018-10-06 ENCOUNTER — Emergency Department (HOSPITAL_COMMUNITY)
Admission: EM | Admit: 2018-10-06 | Discharge: 2018-10-06 | Disposition: A | Discharge status: Home / Self Care | Payer: 59 | Attending: Emergency Medicine | Admitting: Emergency Medicine

## 2018-10-06 DIAGNOSIS — N201 Calculus of ureter: Secondary | ICD-10-CM

## 2018-10-06 DIAGNOSIS — Z8546 Personal history of malignant neoplasm of prostate: Secondary | ICD-10-CM | POA: Diagnosis not present

## 2018-10-06 DIAGNOSIS — I10 Essential (primary) hypertension: Secondary | ICD-10-CM | POA: Diagnosis not present

## 2018-10-06 DIAGNOSIS — Z79899 Other long term (current) drug therapy: Secondary | ICD-10-CM | POA: Insufficient documentation

## 2018-10-06 DIAGNOSIS — N132 Hydronephrosis with renal and ureteral calculous obstruction: Secondary | ICD-10-CM | POA: Insufficient documentation

## 2018-10-06 DIAGNOSIS — R109 Unspecified abdominal pain: Secondary | ICD-10-CM

## 2018-10-06 LAB — CBC
HCT: 40.8 % (ref 39.0–52.0)
Hemoglobin: 13.3 g/dL (ref 13.0–17.0)
MCH: 28.2 pg (ref 26.0–34.0)
MCHC: 32.6 g/dL (ref 30.0–36.0)
MCV: 86.6 fL (ref 80.0–100.0)
Platelets: 191 10*3/uL (ref 150–400)
RBC: 4.71 MIL/uL (ref 4.22–5.81)
RDW: 13.8 % (ref 11.5–15.5)
WBC: 7.7 10*3/uL (ref 4.0–10.5)
nRBC: 0 % (ref 0.0–0.2)

## 2018-10-06 LAB — URINALYSIS, ROUTINE W REFLEX MICROSCOPIC
Bilirubin Urine: NEGATIVE
Glucose, UA: NEGATIVE mg/dL
Ketones, ur: 5 mg/dL — AB
Leukocytes,Ua: NEGATIVE
Nitrite: NEGATIVE
Protein, ur: 30 mg/dL — AB
Specific Gravity, Urine: 1.02 (ref 1.005–1.030)
pH: 5 (ref 5.0–8.0)

## 2018-10-06 LAB — BASIC METABOLIC PANEL
Anion gap: 10 (ref 5–15)
BUN: 16 mg/dL (ref 6–20)
CO2: 25 mmol/L (ref 22–32)
Calcium: 9.4 mg/dL (ref 8.9–10.3)
Chloride: 104 mmol/L (ref 98–111)
Creatinine, Ser: 1.16 mg/dL (ref 0.61–1.24)
GFR calc Af Amer: >60 mL/min (ref >60)
GFR calc non Af Amer: >60 mL/min (ref >60)
Glucose, Bld: 158 mg/dL — ABNORMAL HIGH (ref 70–99)
Potassium: 4 mmol/L (ref 3.5–5.1)
Sodium: 139 mmol/L (ref 135–145)

## 2018-10-06 MED ORDER — KETOROLAC TROMETHAMINE 30 MG/ML IJ SOLN
30.0000 mg | Freq: Once | INTRAMUSCULAR | Status: AC
  Administered 2018-10-06: 30 mg via INTRAVENOUS
  Filled 2018-10-06: qty 1

## 2018-10-06 MED ORDER — ONDANSETRON HCL 4 MG/2ML IJ SOLN
4.0000 mg | Freq: Once | INTRAMUSCULAR | Status: AC
  Administered 2018-10-06: 4 mg via INTRAVENOUS
  Filled 2018-10-06: qty 2

## 2018-10-06 MED ORDER — HYDROMORPHONE HCL 1 MG/ML IJ SOLN
1.0000 mg | Freq: Once | INTRAMUSCULAR | Status: AC
  Administered 2018-10-06: 1 mg via INTRAVENOUS
  Filled 2018-10-06: qty 1

## 2018-10-06 MED ORDER — PROMETHAZINE HCL 25 MG PO TABS: 25.0000 mg | ORAL_TABLET | Freq: Four times a day (QID) | ORAL | 0 refills | Status: DC | PRN

## 2018-10-06 MED ORDER — OXYCODONE HCL 5 MG PO TABS: 5.0000 mg | ORAL_TABLET | ORAL | 0 refills | Status: AC | PRN

## 2018-10-06 NOTE — ED Provider Notes (Signed)
[facility] EMERGENCY DEPARTMENT Provider Note   CSN: [id number] Arrival date & time: 10/06/18  0533    History   Chief Complaint Chief Complaint  Patient presents with  . Flank Pain    HPI Martin Murphy is a 53 y.o. male.     53 year old male presenting to the emergency department for evaluation of left flank pain.  This has been constant and worsening.  Began just before he went to bed tonight.  Reports waves of worsening pain with associated nausea.  Emesis has been sporadic.  He had a normal bowel movement yesterday.  Abdominal surgical history significant for hernia repairs.  The history is provided by the patient. No language interpreter was used.  Flank Pain This is a new problem. The current episode started 6 to 12 hours ago. The problem occurs constantly. The problem has been gradually worsening. Associated symptoms include abdominal pain. Nothing relieves the symptoms. He has tried nothing for the symptoms. The treatment provided no relief.    Past Medical History:  Diagnosis Date  . At risk for sleep apnea    STOP-BANG=5    SENT TO PCP 10-13-2013  . Bunion    bilateral great toe  . 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 Hypertension   . Nocturia   . Prostate cancer ([place]) DX  12/20/2012   biopsies x 2, Gleason 3+3=6  vol 36  . 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 Abdominal discomfort in right lower quadrant 04/02/2018  . Testicular mass 04/02/2018  . History of prostate cancer 04/02/2018  . S/P hernia repair 04/02/2018  . Elevated LFTs 04/02/2018  . Elevated alkaline phosphatase level 04/02/2018  . Tubular adenoma 04/02/2018  . Acute low back pain without sciatica 01/26/2018  . Chronic right SI joint pain 01/26/2018  . Elevated LDL cholesterol level 01/15/2016  . Lumbar disc disease 07/16/2015  . Chronic lumbar pain 07/02/2015  . Vitamin D  deficiency 04/27/2015  . Low testosterone 04/27/2015  . Peripheral neuropathy 02/18/2015  . 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 Bunion of great toe 03/31/2014  . Liver dysfunction 12/28/2013  . HTN (hypertension) 10/28/2013  . Malignant neoplasm of prostate ([place]) 08/02/2013  . MGUS (monoclonal gammopathy of unknown significance) 05/31/2013    Past Surgical History:  Procedure Laterality Date  . BRONCHOSCOPY  1990's   w/  biopsy's  . BUNIONECTOMY    . FOOT SURGERY     3 surgeries total  . INGUINAL HERNIA REPAIR Bilateral right  02-09-2012/   left  1990's  . PROSTATE BIOPSY  12/20/12   gleason 6  . PROSTATE BIOPSY  06/07/13   gleason 6, vol 36 gms  . RADIOACTIVE SEED IMPLANT N/A 10/20/2013   Procedure: RADIOACTIVE SEED IMPLANT;  Surgeon: Bernestine Amass, MD;  Location: [facility];  Service: Urology;  Laterality: N/A;       Home Medications    Prior to Admission medications   Medication Sig Start Date End Date Taking? Authorizing Provider  Ascorbic Acid (VITAMIN C PO) Take by mouth daily. Reported on 05/25/2015    [provider]  atorvastatin (LIPITOR) 20 MG tablet TAKE 1 TABLET BY MOUTH EVERY DAY 09/06/18   Henson, Vickie L, NP-C  Cholecalciferol (VITAMIN D3 PO) Take by mouth.    [provider]  hydrochlorothiazide (HYDRODIURIL) 25 MG tablet TAKE 1 TABLET BY  MOUTH EVERY DAY 05/25/18   Henson, Vickie L, NP-C  Pyridoxine HCl (B-6 PO) Take by mouth.    [provider]  sildenafil (VIAGRA) 50 MG tablet Take 50 mg by mouth daily as needed for erectile dysfunction.    [provider]  tamsulosin (FLOMAX) 0.4 MG CAPS capsule Take 0.4 mg by mouth.    [provider]  vitamin B-12 (CYANOCOBALAMIN) 1000 MCG tablet Take 1,000 mcg by mouth daily. Reported on 05/25/2015    [provider]    Family History Family History  Problem Relation Age of Onset  . Hypertension Brother   . Prostate cancer  Maternal Uncle        1 had surgery, 1 had seeds  . Cancer Maternal Grandfather        prostate  . Diabetes Brother   . Colon cancer Neg Hx   . Esophageal cancer Neg Hx   . Stomach cancer Neg Hx   . Rectal cancer Neg Hx     Social History Social History   Tobacco Use  . Smoking status: Never Smoker  . Smokeless tobacco: Never Used  Substance Use Topics  . Alcohol use: No  . Drug use: No     Allergies   Patient has no known allergies.   Review of Systems Review of Systems  Gastrointestinal: Positive for abdominal pain.  Genitourinary: Positive for flank pain.  Ten systems reviewed and are negative for acute change, except as noted in the HPI.    Physical Exam Updated Vital Signs BP 124/78 (BP Location: Right Arm)   Pulse 68   Temp 97.9 F (36.6 C) (Oral)   Resp 16   SpO2 99%   Physical Exam Vitals signs and nursing note reviewed.  Constitutional:      General: He is not in acute distress.    Appearance: He is well-developed. He is not diaphoretic.     Comments: Appears uncomfortable.  Rolling around the exam room bed.  HENT:     Head: Normocephalic and atraumatic.  Eyes:     General: No scleral icterus.    Conjunctiva/sclera: Conjunctivae normal.  Neck:     Musculoskeletal: Normal range of motion.  Pulmonary:     Effort: Pulmonary effort is normal. No respiratory distress.     Comments: Respirations even and unlabored Abdominal:     Comments: Soft abdomen with tenderness to palpation to the left mid abdomen; mildly to flank.  No peritoneal signs or palpable masses.  Musculoskeletal: Normal range of motion.  Skin:    General: Skin is warm and dry.     Coloration: Skin is not pale.     Findings: No erythema or rash.  Neurological:     General: No focal deficit present.     Mental Status: He is alert and oriented to person, place, and time.     Coordination: Coordination normal.     Comments: Moving all extremities spontaneously.  Psychiatric:         Behavior: Behavior normal.      ED Treatments / Results  Labs (all labs ordered are listed, but only abnormal results are displayed) Labs Reviewed  URINALYSIS, ROUTINE W REFLEX MICROSCOPIC - Abnormal; Notable for the following components:      Result Value   Hgb urine dipstick SMALL (*)    Ketones, ur 5 (*)    Protein, ur 30 (*)    Bacteria, UA RARE (*)    All other components within normal limits  CBC  BASIC METABOLIC PANEL    EKG None  Radiology No results found.  Procedures Procedures (including critical care time)  Medications Ordered in ED Medications  HYDROmorphone (DILAUDID) injection 1 mg (1 mg Intravenous Given 10/06/18 0637)  ketorolac (TORADOL) 30 MG/ML injection 30 mg (30 mg Intravenous Given 10/06/18 0637)  ondansetron (ZOFRAN) injection 4 mg (4 mg Intravenous Given 10/06/18 3151)     Initial Impression / Assessment and Plan / ED Course  I have reviewed the triage vital signs and the nursing notes.  Pertinent labs & imaging results that were available during my care of the patient were reviewed by me and considered in my medical decision making (see chart for details).        53 year old male with left flank pain onset last night.  He has had constant pain with waxing and waning severity.  Symptoms associated with nausea and sporadic vomiting.  He does have microscopic hematuria.  Pending metabolic panel and CT scan read.  Patient signed out to Carmon Sails, PA-C at change of shift who will follow-up on imaging and disposition appropriately.   Final Clinical Impressions(s) / ED Diagnoses   Final diagnoses:  Left flank pain    ED Discharge Orders    None       Antonietta Breach, PA-C 10/06/18 0703    Veryl Speak, MD 10/06/18 [phone]

## 2018-10-06 NOTE — ED Provider Notes (Signed)
0720: Pt handed off to me by previous EDPA at shift change pending CT renal.  Please see previous note for full details.  Briefly 53 year old M with left flank pain.    ER work-up reviewed by me is unremarkable.  Normal creatinine.  UA has hematuria but no infection.  No leukocytosis.  Vital signs WNL.  CT renal shows left UVJ small stone and mild hydroureter and findings consistent with history of sarcoid.  Patient reevaluated and reports significant improvement in pain.  Discussed ER work-up and plan to discharge with treatment for kidney stone.  Return precautions discussed.  Patient is comfortable with this.   Kinnie Feil, PA-C 10/06/18 8022    Tegeler, Gwenyth Allegra, MD 10/06/18 [phone]

## 2018-10-06 NOTE — ED Triage Notes (Signed)
Patient with left flank pain.  He states that it started in his back earlier in the night, now moved to his flank.  He does have some nausea.  He denies any urinary problems at this time.  He states that the pain and nausea woke up from sleep.

## 2018-10-06 NOTE — Discharge Instructions (Signed)
You have a 2 mm stone in the ureterovesicular junction.  This is the cause of your pain.  Treatment of kidney stones includes pain and nausea control.   For pain and inflammation you can use a combination of ibuprofen and acetaminophen.  Take [phone] mg acetaminophen (tylenol) every 6 hours or 600 mg ibuprofen (advil, motrin) every 6 hours.  You can take these separately or combine them every 6 hours for maximum pain control. Do not exceed 4,000 mg acetaminophen or 2,400 mg ibuprofen in a 24 hour period.  Do not take ibuprofen containing products if you have history of kidney disease, ulcers, GI bleeding, severe acid reflux, or take a blood thinner.  Do not take acetaminophen if you have liver disease.   For break through and/or severe pain despite ibuprofen and acetaminophen regimen, take 5 mg oxycodone every 4 hours.  Oxycodone is a narcotic pain medication that has risk of overdose, death, dependence and abuse. Mild and expected side effects include nausea, stomach upset, drowsiness, constipation. Do not consume alcohol, drive or use heavy machinery while taking this medication. Do not leave unattended around children. Flush any remaining pills that you do not use and do not share.  The emergency department has a strict policy regarding prescription of narcotic medications. We prescribe a short course for acute, new pain or injuries. We are unable to refill this medication in the emergency department for chronic pain or repeatedly.  Refill need to be done by specialist or primary care provider or pain clinic.  Contact your primary care provider or specialist for chronic pain management and refill on narcotic medications.   Use Phenergan every 6 hours for nausea and vomiting.  Stable hydrated.  Pain may linger even despite passing the stone but should improve.  Follow-up with urology in the next 7 to 10 days if you are still having lingering pain for reevaluation.  Return to the ER for fever greater  than 100, worsening uncontrollable pain despite medicine, persistent vomiting despite nausea medicine, difficulty urinating

## 2018-10-07 IMAGING — US US ABDOMEN COMPLETE
1 series · 14 of 25 positions shown · non-contrast
Comparison: 01/04/2014

CLINICAL DATA: Elevated LFTs

EXAM:
ABDOMEN ULTRASOUND COMPLETE

[Series 1: us abdomen complete · 0.22mm/px · 14 of 114 slices shown]
[im 1/114]
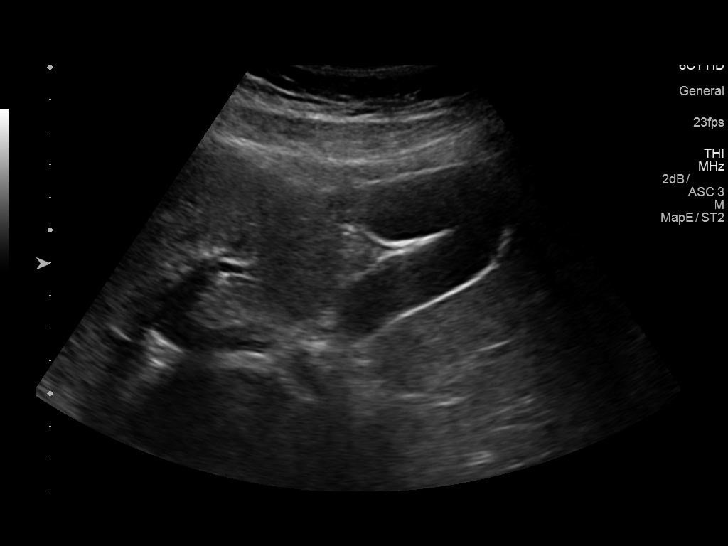
[im 10/114]
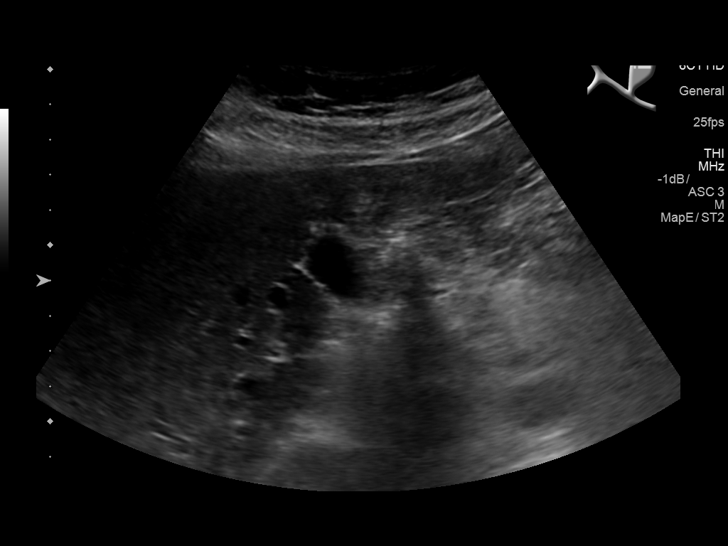
[im 19/114]
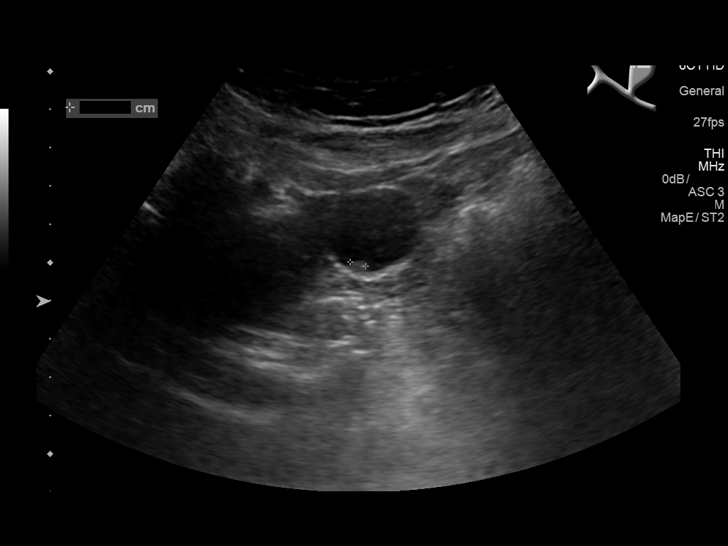
[im 29/114]
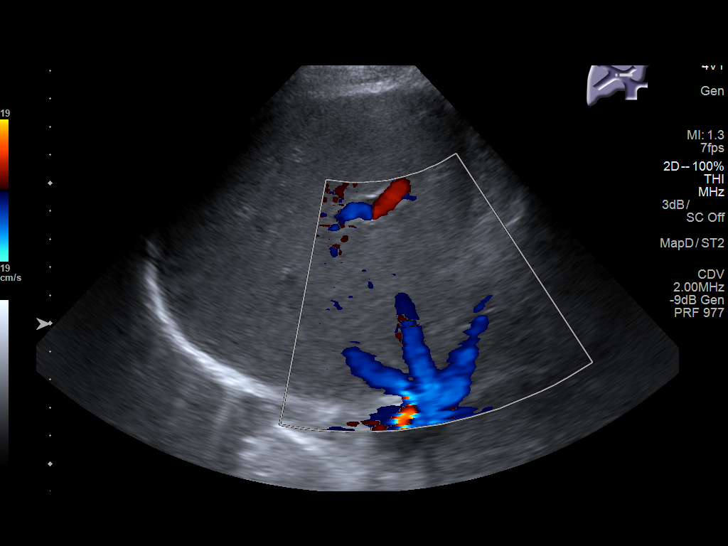
[im 38/114]
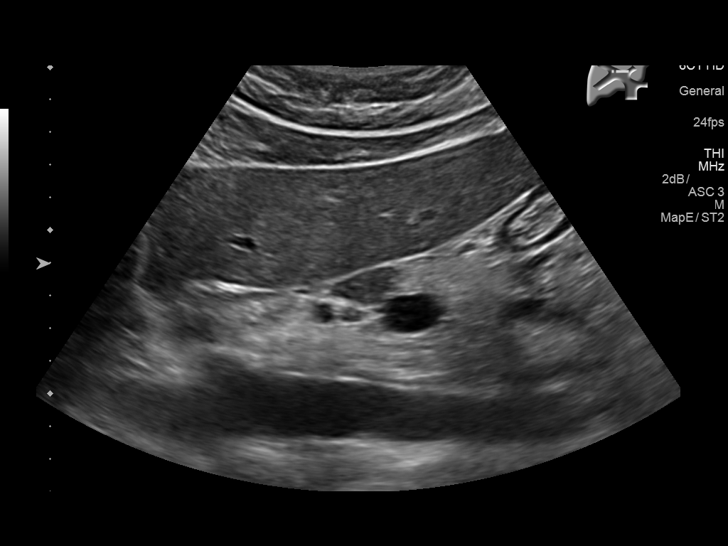
[im 43/114]
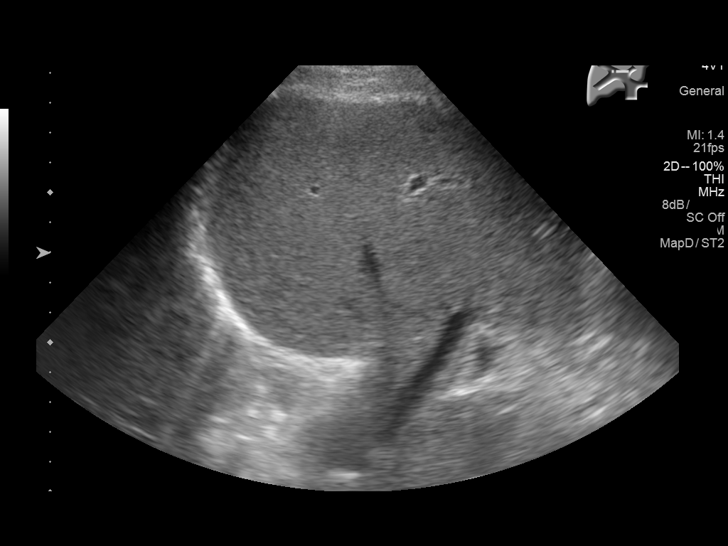
[im 52/114]
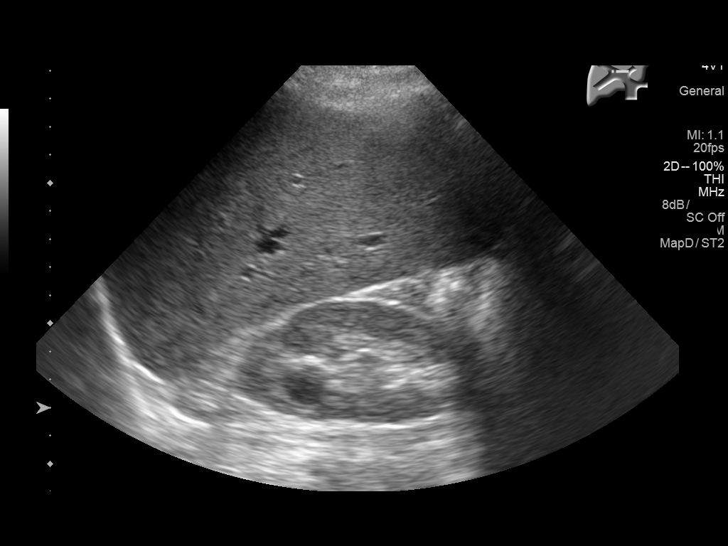
[im 62/114]
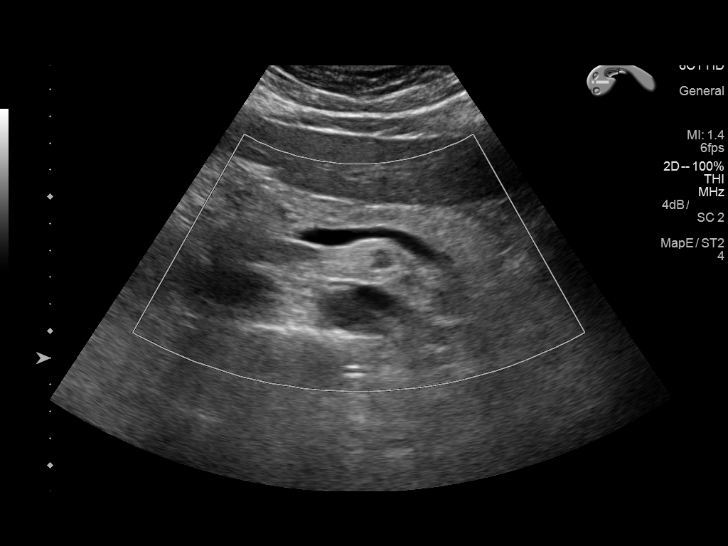
[im 71/114]
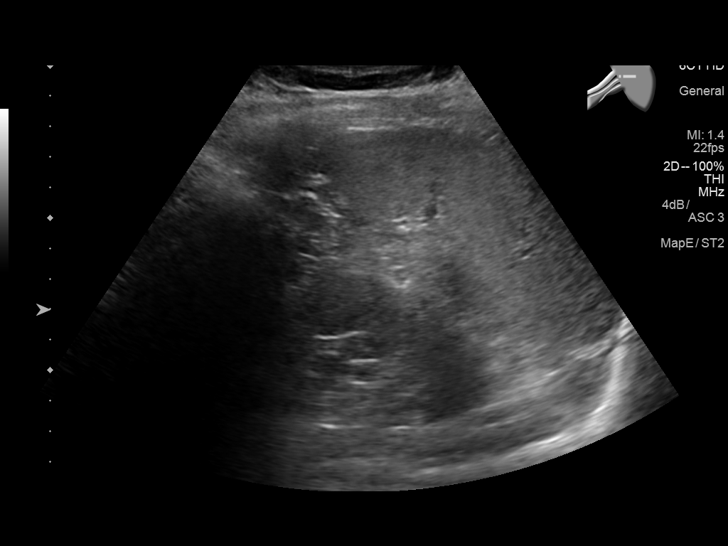
[im 76/114]
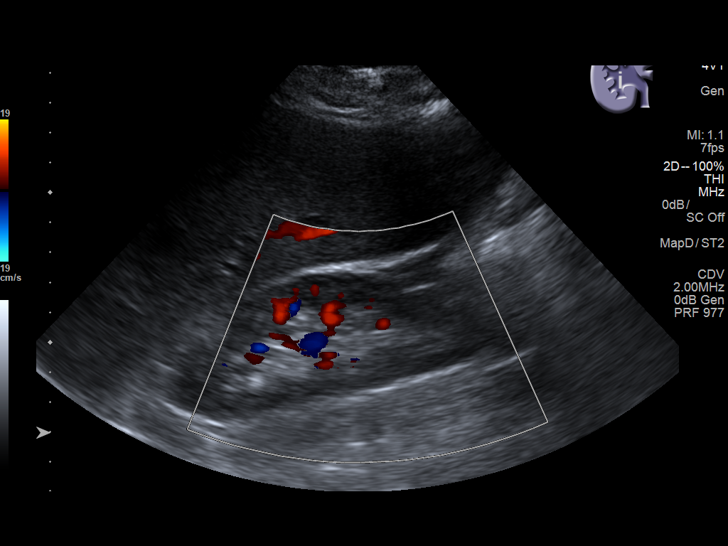
[im 85/114]
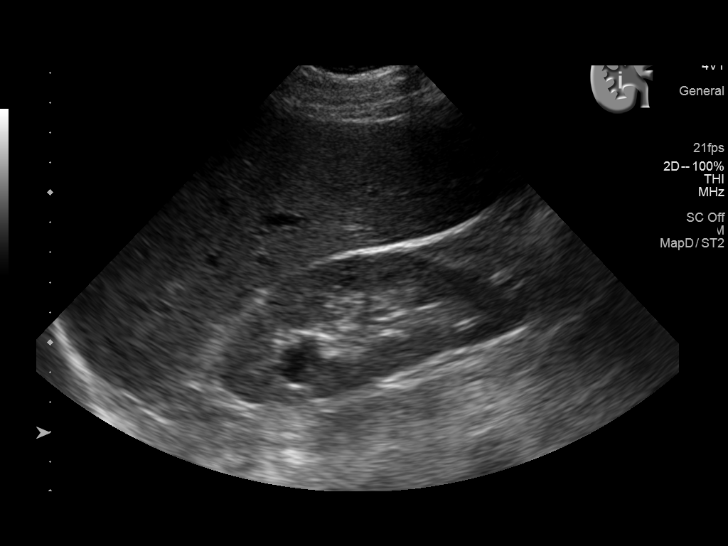
[im 95/114]
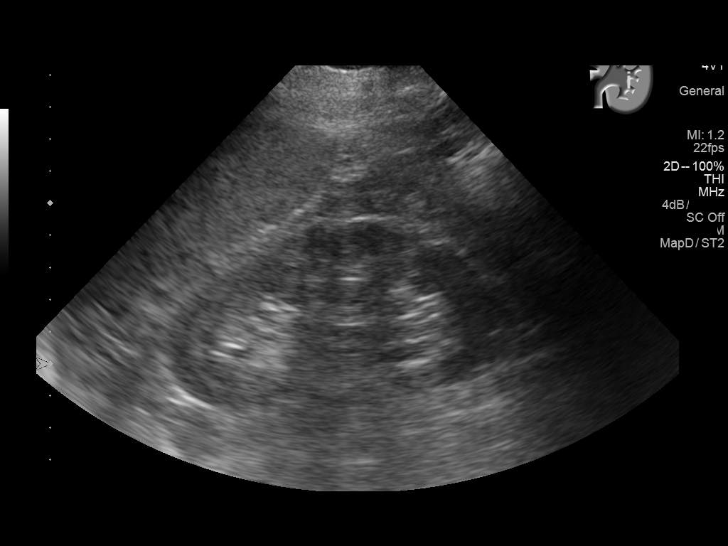
[im 104/114]
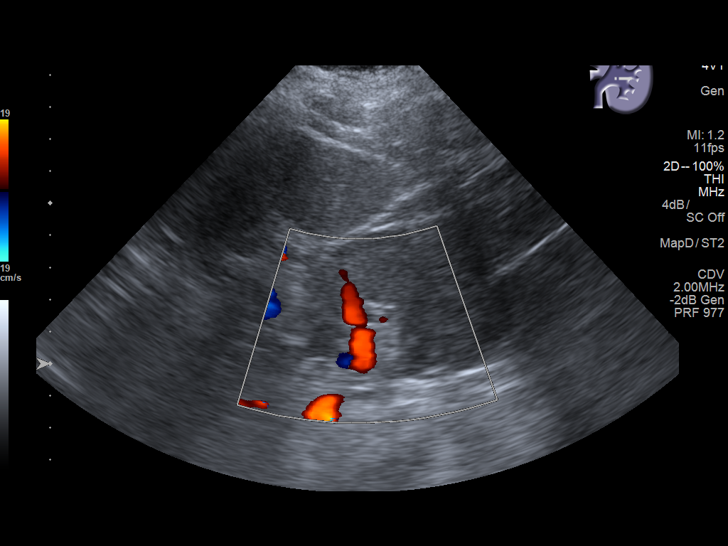
[im 114/114]
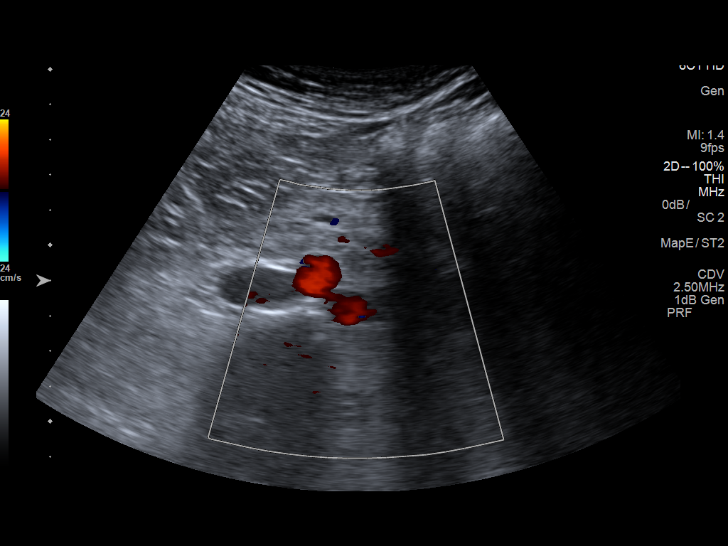

[14 of 25 positions shown; findings below may reference images not displayed]

FINDINGS: Gallbladder: Gallbladder is well distended without evidence
cholelithiasis. Non mobile 5 mm echogenicity is noted consistent
with a small gallbladder polyp.

Common bile duct: Diameter: 2.7 mm.

Liver: No focal lesion identified. Within normal limits in
parenchymal echogenicity.

IVC: No abnormality visualized.

Pancreas: Visualized portion unremarkable.

Spleen: Size and appearance within normal limits.

Right Kidney: Length: 10.7 cm.. 1.5 cm cyst is again seen in the
upper pole of the right kidney.

Left Kidney: Length: 10.9 cm.. Echogenicity within normal limits. No
mass or hydronephrosis visualized.

Abdominal aorta: No aneurysm visualized.

Other findings: None.
IMPRESSION: Small gallbladder polyp.

Stable right renal cyst.

## 2018-12-06 ENCOUNTER — Other Ambulatory Visit: Payer: 59

## 2019-03-06 ENCOUNTER — Other Ambulatory Visit: Payer: Self-pay | Admitting: Family Medicine

## 2019-05-10 NOTE — Progress Notes (Signed)
Subjective:    Patient ID: Martin Murphy, male    DOB: 1965-04-17, 54 y.o.   MRN: [id number]  HPI Chief Complaint  Patient presents with  . fasting cpe    fasting cpe   He is here for a complete physical exam. No new concerns today  Previous medical care: Last CPE: 05/10/2018  Other providers: Urologist-  Dr. Lovena Neighbours. every 6 months  Pain management- Dr Naaman Plummer in the past. Dr. Erlinda Hong at [facility] for pain now.  Podiatrist- Dr. Jacqualyn Posey- no longer seeing him  Neurologist- Dr. Jannifer Franklin nerve conduction studies in the past. Not currently seeing them.  Oncology- [facility] Dr. Earlie Server. Last there one year ago. States he was cleared by them.  GI- Dr. Havery Moros  Eyes- Dr. Jarrett Soho   HTN- takes 12.5 mg of HCTZ daily. Does not check BP at home.   HL- ASCVD 10 yr risk 8.7%. is taking statin and no concerns   Vitamin D def- taking a supplement. 10,000 IUs daily.   Liver dysfunction - elev alk phos in past and has been followed by Dr. Havery Moros   Social history: Lives with wife and 2 kids who are 85 and 12, works as a Film/video editor. Bul[employer] material for medicine, [employer] Denies smoking, drinking alcohol, drug use Diet: unhealthy  Exercise: none   Immunizations: Tdap UTD  Getting Covid vaccine tomorrow   Health maintenance:  Colonoscopy: 2016 and due for recall in 12/2019 Last PSA: managed by urologist  Last Dental Exam: May or June 2020. Had some decay.  Last Eye Exam: later this month   Wears seatbelt always, smoke detectors in home and functioning, does not text while driving, feels safe in home environment.  Reviewed allergies, medications, past medical, surgical, family, and social history.   Review of Systems Review of Systems Constitutional: -fever, -chills, -sweats, -unexpected weight change,-fatigue ENT: -runny nose, -ear pain, -sore throat Cardiology:  -chest pain, -palpitations, -edema Respiratory: -cough, -shortness of breath, -wheezing Gastroenterology:  -abdominal pain, -nausea, -vomiting, -diarrhea, -constipation  Hematology: -bleeding or bruising problems Musculoskeletal: -arthralgias, -myalgias, -joint swelling, -back pain Ophthalmology: -vision changes Urology: -dysuria, -difficulty urinating, -hematuria, -urinary frequency, -urgency Neurology: -headache, -weakness, -tingling, -numbness       Objective:   Physical Exam BP 120/70   Pulse 77   Temp 97.8 F (36.6 C)   Ht 5' 11.5" (1.816 m)   Wt 211 lb 12.8 oz (96.1 kg)   BMI 29.13 kg/m   General Appearance:    Alert, cooperative, no distress, appears stated age  Head:    Normocephalic, without obvious abnormality, atraumatic  Eyes:    PERRL, conjunctiva/corneas clear, EOM's intact  Ears:    Normal TM's and external ear canals  Nose:   Mask in place   Throat:   Mask in place   Neck:   Supple, no lymphadenopathy;  thyroid:  no   enlargement/tenderness/nodules; no JVD  Back:    Spine nontender, no curvature, ROM normal, no CVA     tenderness  Lungs:     Clear to auscultation bilaterally without wheezes, rales or     ronchi; respirations unlabored  Chest Wall:    No tenderness or deformity   Heart:    Regular rate and rhythm, S1 and S2 normal, no murmur, rub   or gallop  Breast Exam:    No chest wall tenderness, masses or gynecomastia  Abdomen:     Soft, non-tender, nondistended, normoactive bowel sounds,    no masses, no  hepatosplenomegaly  Genitalia:    Sees urology      Extremities:   No clubbing, cyanosis or edema  Pulses:   2+ and symmetric all extremities  Skin:   Skin color, texture, turgor normal, no rashes or lesions  Lymph nodes:   Cervical, supraclavicular, and axillary nodes normal  Neurologic:   CNII-XII intact, normal strength, sensation and gait; reflexes 2+ and symmetric throughout          Psych:   Normal mood, affect, hygiene and grooming.         Assessment & Plan:  Routine general medical examination at a health care facility - Plan: CBC with  Differential/Platelet, Comprehensive metabolic panel, TSH, T4, free, Lipid panel -Here for a fasting CPE.  He has no concerns and reports being in his usual state of health. Discussed healthy diet and exercise, states he has not been good about either over the past few months. Reviewed preventive health care and he is due for follow-up colonoscopy later this year.  He sees urology and PSA followed by them.  Discussed safety and health promotion. He is getting a Covid vaccine tomorrow and I advised him to avoid all other vaccines within 2 weeks of the Covid vaccine. Follow-up pending labs  Essential hypertension -Blood pressure is well controlled.  He does not check it at home.  Reports good daily compliance with his medications.  Counseling on healthy diet and exercise to help control this as well.  Elevated LDL cholesterol level - Plan: Lipid panel -He is taking a daily statin without any issues.  Follow-up pending lipid panel  Vitamin D deficiency - Plan: VITAMIN D 25 Hydroxy (Vit-D Deficiency, Fractures) -Discussed that he is taking a high dose of vitamin D, 10,000 IUs daily may be too much.  I will check his vitamin D level and follow-up.  Liver dysfunction -Check hepatic panel.  Followed by GI as well.  History of prostate cancer -Sees urology every 6 months  Medication management - Plan: VITAMIN D 25 Hydroxy (Vit-D Deficiency, Fractures), Vitamin B12 -He cannot recall if he has a history of vitamin B12 deficiency but has been advised to take a supplement and has been on one for years.  Previous B12 level was actually high.  May need to adjust his dose if it is still elevated.

## 2019-05-10 NOTE — Patient Instructions (Signed)
Preventive Care 41-56 Years Old, Male Preventive care refers to lifestyle choices and visits with your health care provider that can promote health and wellness. This includes:  A yearly physical exam. This is also called an annual well check.  Regular dental and eye exams.  Immunizations.  Screening for certain conditions.  Healthy lifestyle choices, such as eating a healthy diet, getting regular exercise, not using drugs or products that contain nicotine and tobacco, and limiting alcohol use. What can I expect for my preventive care visit? Physical exam Your health care provider will check:  Height and weight. These may be used to calculate body mass index (BMI), which is a measurement that tells if you are at a healthy weight.  Heart rate and blood pressure.  Your skin for abnormal spots. Counseling Your health care provider may ask you questions about:  Alcohol, tobacco, and drug use.  Emotional well-being.  Home and relationship well-being.  Sexual activity.  Eating habits.  Work and work Statistician. What immunizations do I need?  Influenza (flu) vaccine  This is recommended every year. Tetanus, diphtheria, and pertussis (Tdap) vaccine  You may need a Td booster every 10 years. Varicella (chickenpox) vaccine  You may need this vaccine if you have not already been vaccinated. Zoster (shingles) vaccine  You may need this after age 56. Measles, mumps, and rubella (MMR) vaccine  You may need at least one dose of MMR if you were born in 1957 or later. You may also need a second dose. Pneumococcal conjugate (PCV13) vaccine  You may need this if you have certain conditions and were not previously vaccinated. Pneumococcal polysaccharide (PPSV23) vaccine  You may need one or two doses if you smoke cigarettes or if you have certain conditions. Meningococcal conjugate (MenACWY) vaccine  You may need this if you have certain conditions. Hepatitis A  vaccine  You may need this if you have certain conditions or if you travel or work in places where you may be exposed to hepatitis A. Hepatitis B vaccine  You may need this if you have certain conditions or if you travel or work in places where you may be exposed to hepatitis B. Haemophilus influenzae type b (Hib) vaccine  You may need this if you have certain risk factors. Human papillomavirus (HPV) vaccine  If recommended by your health care provider, you may need three doses over 6 months. You may receive vaccines as individual doses or as more than one vaccine together in one shot (combination vaccines). Talk with your health care provider about the risks and benefits of combination vaccines. What tests do I need? Blood tests  Lipid and cholesterol levels. These may be checked every 5 years, or more frequently if you are over 60 years old.  Hepatitis C test.  Hepatitis B test. Screening  Lung cancer screening. You may have this screening every year starting at age 56 if you have a 30-pack-year history of smoking and currently smoke or have quit within the past 15 years.  Prostate cancer screening. Recommendations will vary depending on your family history and other risks.  Colorectal cancer screening. All adults should have this screening starting at age 56 and continuing until age 2. Your health care provider may recommend screening at age 14 if you are at increased risk. You will have tests every 1-10 years, depending on your results and the type of screening test.  Diabetes screening. This is done by checking your blood sugar (glucose) after you have not eaten  for a while (fasting). You may have this done every 1-3 years.  Sexually transmitted disease (STD) testing. Follow these instructions at home: Eating and drinking  Eat a diet that includes fresh fruits and vegetables, whole grains, lean protein, and low-fat dairy products.  Take vitamin and mineral supplements as  recommended by your health care provider.  Do not drink alcohol if your health care provider tells you not to drink.  If you drink alcohol: ? Limit how much you have to 0-2 drinks a day. ? Be aware of how much alcohol is in your drink. In the [place] one drink equals one 12 oz bottle of beer (355 mL), one 5 oz glass of wine (148 mL), or one 1 oz glass of hard liquor (44 mL). Lifestyle  Take daily care of your teeth and gums.  Stay active. Exercise for at least 30 minutes on 5 or more days each week.  Do not use any products that contain nicotine or tobacco, such as cigarettes, e-cigarettes, and chewing tobacco. If you need help quitting, ask your health care provider.  If you are sexually active, practice safe sex. Use a condom or other form of protection to prevent STIs (sexually transmitted infections).  Talk with your health care provider about taking a low-dose aspirin every day starting at age 56. What's next?  Go to your health care provider once a year for a well check visit.  Ask your health care provider how often you should have your eyes and teeth checked.  Stay up to date on all vaccines. This information is not intended to replace advice given to you by your health care provider. Make sure you discuss any questions you have with your health care provider. Document Revised: 02/11/2018 Document Reviewed: 02/11/2018 Elsevier Patient Education  2020 [employer].

## 2019-05-11 ENCOUNTER — Other Ambulatory Visit: Payer: Self-pay

## 2019-05-11 ENCOUNTER — Encounter: Payer: Self-pay | Admitting: Family Medicine

## 2019-05-11 ENCOUNTER — Ambulatory Visit: Payer: 59 | Admitting: Family Medicine

## 2019-05-11 VITALS — BP 120/70 | HR 77 | Temp 97.8°F | Ht 71.5 in | Wt 211.8 lb

## 2019-05-11 DIAGNOSIS — Z79899 Other long term (current) drug therapy: Secondary | ICD-10-CM

## 2019-05-11 DIAGNOSIS — E7800 Pure hypercholesterolemia, unspecified: Secondary | ICD-10-CM

## 2019-05-11 DIAGNOSIS — E559 Vitamin D deficiency, unspecified: Secondary | ICD-10-CM

## 2019-05-11 DIAGNOSIS — Z8546 Personal history of malignant neoplasm of prostate: Secondary | ICD-10-CM

## 2019-05-11 DIAGNOSIS — I10 Essential (primary) hypertension: Secondary | ICD-10-CM

## 2019-05-11 DIAGNOSIS — K7689 Other specified diseases of liver: Secondary | ICD-10-CM | POA: Diagnosis not present

## 2019-05-11 DIAGNOSIS — Z0000 Encounter for general adult medical examination without abnormal findings: Secondary | ICD-10-CM | POA: Diagnosis not present

## 2019-05-12 LAB — COMPREHENSIVE METABOLIC PANEL
ALT: 47 IU/L — ABNORMAL HIGH (ref 0–44)
AST: 38 IU/L (ref 0–40)
Albumin/Globulin Ratio: 1.5 (ref 1.2–2.2)
Albumin: 4.3 g/dL (ref 3.8–4.9)
Alkaline Phosphatase: 138 IU/L — ABNORMAL HIGH (ref 39–117)
BUN/Creatinine Ratio: 11 (ref 9–20)
BUN: 12 mg/dL (ref 6–24)
Bilirubin Total: 0.4 mg/dL (ref 0.0–1.2)
CO2: 26 mmol/L (ref 20–29)
Calcium: 9.6 mg/dL (ref 8.7–10.2)
Chloride: 103 mmol/L (ref 96–106)
Creatinine, Ser: 1.06 mg/dL (ref 0.76–1.27)
GFR calc Af Amer: 91 mL/min/{1.73_m2} (ref >59)
GFR calc non Af Amer: 79 mL/min/{1.73_m2} (ref >59)
Globulin, Total: 2.8 g/dL (ref 1.5–4.5)
Glucose: 93 mg/dL (ref 65–99)
Potassium: 4.9 mmol/L (ref 3.5–5.2)
Sodium: 143 mmol/L (ref 134–144)
Total Protein: 7.1 g/dL (ref 6.0–8.5)

## 2019-05-12 LAB — VITAMIN B12: Vitamin B-12: 717 pg/mL (ref 232–1245)

## 2019-05-12 LAB — CBC WITH DIFFERENTIAL/PLATELET
Basophils Absolute: 0 10*3/uL (ref 0.0–0.2)
Basos: 0 %
EOS (ABSOLUTE): 0.1 10*3/uL (ref 0.0–0.4)
Eos: 3 %
Hematocrit: 41.3 % (ref 37.5–51.0)
Hemoglobin: 14 g/dL (ref 13.0–17.7)
Immature Grans (Abs): 0 10*3/uL (ref 0.0–0.1)
Immature Granulocytes: 0 %
Lymphocytes Absolute: 1.2 10*3/uL (ref 0.7–3.1)
Lymphs: 27 %
MCH: 28.9 pg (ref 26.6–33.0)
MCHC: 33.9 g/dL (ref 31.5–35.7)
MCV: 85 fL (ref 79–97)
Monocytes Absolute: 0.4 10*3/uL (ref 0.1–0.9)
Monocytes: 8 %
Neutrophils Absolute: 2.8 10*3/uL (ref 1.4–7.0)
Neutrophils: 62 %
Platelets: 195 10*3/uL (ref 150–450)
RBC: 4.84 x10E6/uL (ref 4.14–5.80)
RDW: 13.1 % (ref 11.6–15.4)
WBC: 4.5 10*3/uL (ref 3.4–10.8)

## 2019-05-12 LAB — LIPID PANEL
Chol/HDL Ratio: 2.6 ratio (ref 0.0–5.0)
Cholesterol, Total: 133 mg/dL (ref 100–199)
HDL: 51 mg/dL (ref >39)
LDL Chol Calc (NIH): 70 mg/dL (ref 0–99)
Triglycerides: 53 mg/dL (ref 0–149)
VLDL Cholesterol Cal: 12 mg/dL (ref 5–40)

## 2019-05-12 LAB — VITAMIN D 25 HYDROXY (VIT D DEFICIENCY, FRACTURES): Vit D, 25-Hydroxy: 102 ng/mL — ABNORMAL HIGH (ref 30.0–100.0)

## 2019-05-12 LAB — TSH: TSH: 1.43 u[IU]/mL (ref 0.450–4.500)

## 2019-05-12 LAB — T4, FREE: Free T4: 1.16 ng/dL (ref 0.82–1.77)

## 2019-05-12 NOTE — Progress Notes (Signed)
He needs to stop his vitamin D supplement for the next couple of weeks and then cut back to only 1,000 IUs of vitamin D daily. His vitamin D level is too high. This can cause problems if we don't get it down. Otherwise, his labs are all stable and fine.

## 2019-06-20 ENCOUNTER — Ambulatory Visit: Payer: 59 | Admitting: Physician Assistant

## 2019-06-20 ENCOUNTER — Other Ambulatory Visit: Payer: Self-pay | Admitting: Family Medicine

## 2019-06-20 ENCOUNTER — Other Ambulatory Visit: Payer: Self-pay

## 2019-06-20 ENCOUNTER — Encounter: Payer: Self-pay | Admitting: Physician Assistant

## 2019-06-20 DIAGNOSIS — L669 Cicatricial alopecia, unspecified: Secondary | ICD-10-CM

## 2019-06-20 DIAGNOSIS — Z760 Encounter for issue of repeat prescription: Secondary | ICD-10-CM | POA: Diagnosis not present

## 2019-06-20 MED ORDER — CLOBETASOL PROPIONATE 0.05 % EX FOAM: 1.0000 "application " | Freq: Two times a day (BID) | CUTANEOUS | 2 refills | Status: DC

## 2019-06-20 NOTE — Patient Instructions (Addendum)
Advised patient to pick up some Tarsum shampoo (over the counter).  Told him that he could apply it to his scalp pre-shower to let it sit on his scalp before rinsing it out.  Also, discussed using Head n Shoulders.  He states that he currently uses that for shampoo but will try the new one in the blue bottle.  May alternate with the Tarsum shampoo.

## 2019-06-20 NOTE — Progress Notes (Signed)
   Follow up Visit  Subjective  Martin Murphy is a 54 y.o. male who presents for the following: Medication Refill (Here today for a refill on his Clobetasol foam.  ). He feels like his scalp isn't any better overall.  He ran out of Doxycycline and it seemed to reflare. He then began to use just the clobetasol foam and it seems to work. When the scalp is flaring it itches and flakes. The clobetasol can help it with just one application. He keeps a short hair cut. He doesn't feel like he has lost any more hair. He feels like his wife's shampoo helps and doesn't feel like the ciclopirox helped and doesn't want to take the antibiotics again.   Objective  Well appearing patient in no apparent distress; mood and affect are within normal limits.  A focused examination was performed including scalp. Relevant physical exam findings are noted in the Assessment and Plan.   Objective  Scalp: Mild scale noted scalp. Compared to photos from 2 years ago the hair loss does not seem to have progressed. There are a few areas of scarring and tufted hairs. No inflammation noted today.  Assessment & Plan  Scarring alopecia Scalp  Reordered Medications clobetasol (OLUX) 0.05 % topical foam

## 2019-09-01 ENCOUNTER — Encounter: Payer: Self-pay | Admitting: Family Medicine

## 2019-09-01 ENCOUNTER — Other Ambulatory Visit: Payer: Self-pay

## 2019-09-01 ENCOUNTER — Telehealth (INDEPENDENT_AMBULATORY_CARE_PROVIDER_SITE_OTHER): Payer: 59 | Admitting: Family Medicine

## 2019-09-01 VITALS — Wt 210.0 lb

## 2019-09-01 DIAGNOSIS — J0140 Acute pansinusitis, unspecified: Secondary | ICD-10-CM | POA: Diagnosis not present

## 2019-09-01 DIAGNOSIS — R05 Cough: Secondary | ICD-10-CM | POA: Diagnosis not present

## 2019-09-01 DIAGNOSIS — R059 Cough, unspecified: Secondary | ICD-10-CM

## 2019-09-01 DIAGNOSIS — J029 Acute pharyngitis, unspecified: Secondary | ICD-10-CM

## 2019-09-01 MED ORDER — AMOXICILLIN 875 MG PO TABS: 875.0000 mg | ORAL_TABLET | Freq: Two times a day (BID) | ORAL | 0 refills | Status: DC

## 2019-09-01 NOTE — Progress Notes (Signed)
   Subjective:  Documentation for virtual audio and video telecommunications through [place] encounter:  The patient was located at home. 2 patient identifiers used.  The provider was located in the office. The patient did consent to this visit and is aware of possible charges through their insurance for this visit.  The other persons participating in this telemedicine service were none. Time spent on call was 11 minutes and in review of previous records 15 minutes total.  This virtual service is not related to other E/M service within previous 7 days.   Patient ID: Martin Murphy, male    DOB: 08/14/1965, 54 y.o.   MRN: [id number]  HPI Chief Complaint  Patient presents with  . cough, sore throat, runny nose    congestion x week. taking otc dayquil, cough drops,advil    Complains of a 6 day history of fever, sinus pressure, nasal congestion, post nasal drainage, sore throat and now coughing up thick, yellowish mucus.   States his fever broke 2 days ago.   States he took off last week and moved into a new house. Slept with the air conditioner and symptoms started after that.  States he had a negative rapid Covid test at [facility].   Reports taking Dayquil, Nyquil and cough drops. He has also taken Advil.   No body aches, headache, ear pain, chest pain, palpitations, shortness of breath, abdominal pain, nausea, vomiting, diarrhea.  Reviewed allergies, medications, past medical, surgical, family, and social history.   Review of Systems Pertinent positives and negatives in the history of present illness.     Objective:   Physical Exam Wt 210 lb (95.3 kg)   BMI 28.88 kg/m   Alert and oriented in no acute distress.  Respirations unlabored.  He does sound nasally congested.  Speaking in complete sense without difficulty.      Assessment & Plan:  Acute non-recurrent pansinusitis - Plan: amoxicillin (AMOXIL) 875 MG tablet  Acute pharyngitis, unspecified  etiology  Cough  Recommend increasing his water intake, trying Flonase or a steroid nasal spray, Mucinex DM and continue with symptomatic treatment.  Discussed that he may have an early sinusitis.  I will prescribe amoxicillin for him to take if he is not turning the corner in the next 2 days.  This is a long holiday weekend and our office will be closed on Monday.  He is agreeable.  He will follow-up if worsening or not back to baseline after completing the antibiotic if he needs to take.

## 2019-10-28 ENCOUNTER — Other Ambulatory Visit: Payer: Self-pay | Admitting: Family Medicine

## 2020-01-06 ENCOUNTER — Encounter: Payer: Self-pay | Admitting: Gastroenterology

## 2020-01-18 ENCOUNTER — Other Ambulatory Visit: Payer: Self-pay | Admitting: Family Medicine

## 2020-04-16 IMAGING — CT CT RENAL STONE PROTOCOL
2 of 4 series · 16 of 46 positions shown, 18 images · non-contrast
Comparison: None similar

CLINICAL DATA: Right flank pain

EXAM:
CT ABDOMEN AND PELVIS WITHOUT CONTRAST
TECHNIQUE: Multidetector CT imaging of the abdomen and pelvis was performed
following the standard protocol without IV contrast.

[Series 3: renal stone 5.0 · axial · 0.89mm/px · z∈[+880,+1345]mm · 13 of 103 slices shown, 15 images]
[im 5/103  soft-tissue]
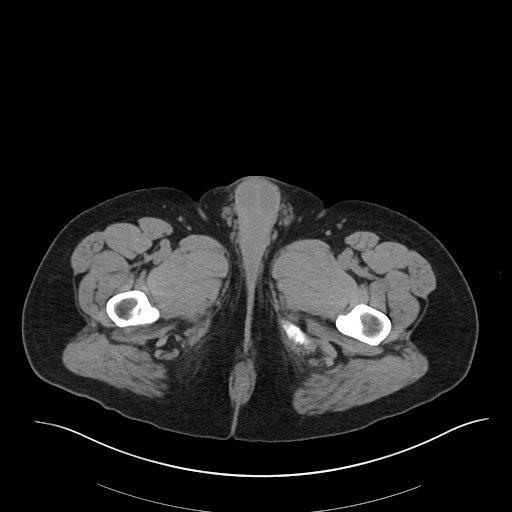
[im 5/103  bone]
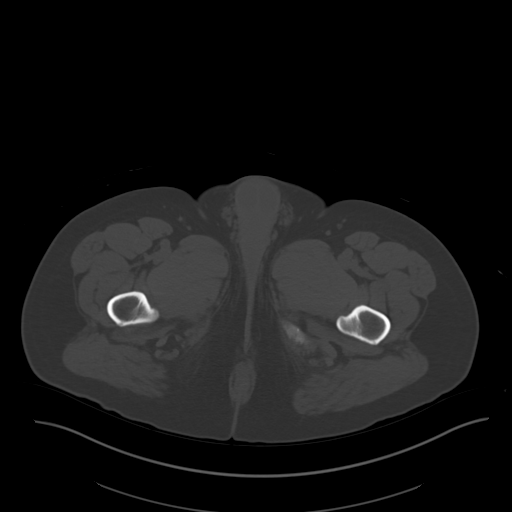
[im 13/103  soft-tissue]
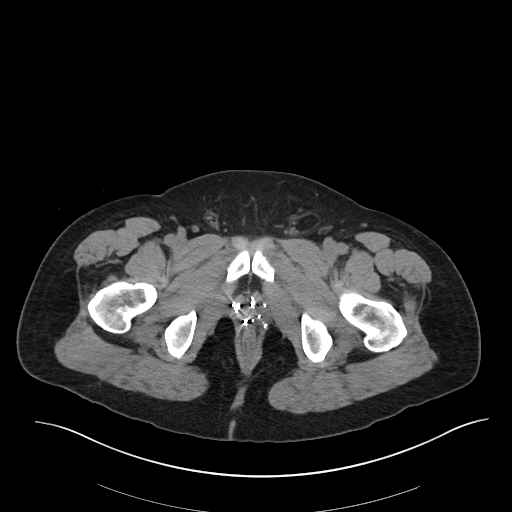
[im 22/103  soft-tissue]
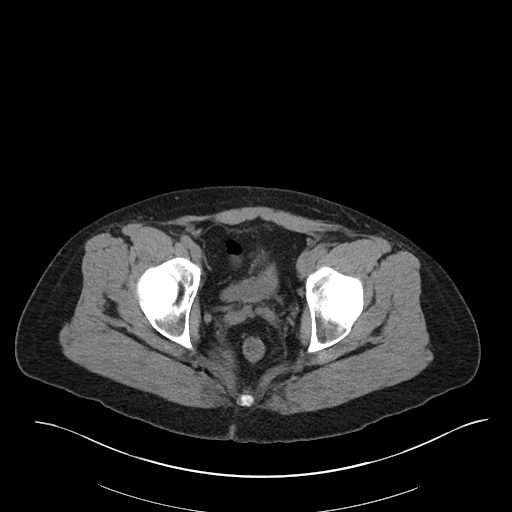
[im 30/103  soft-tissue]
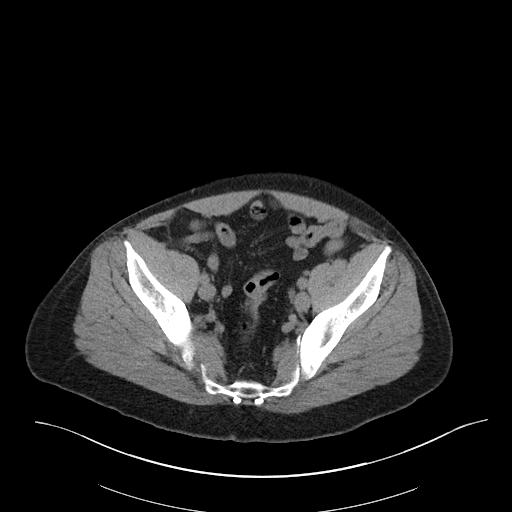
[im 35/103  soft-tissue]
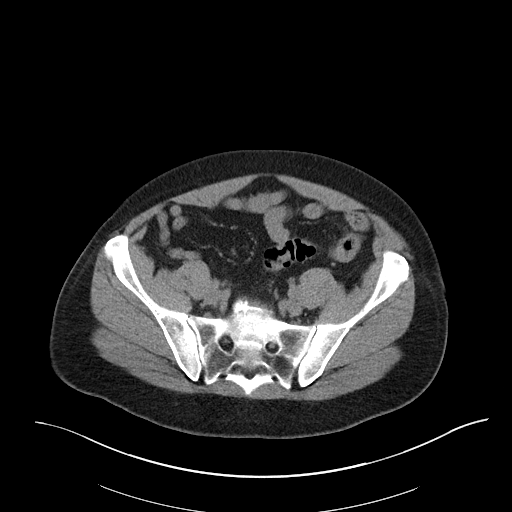
[im 43/103  soft-tissue]
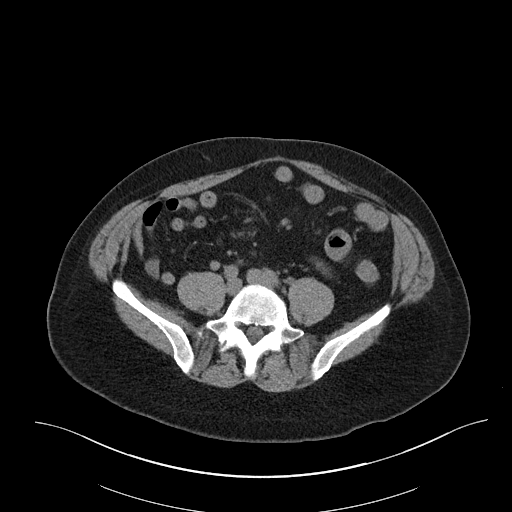
[im 52/103  soft-tissue]
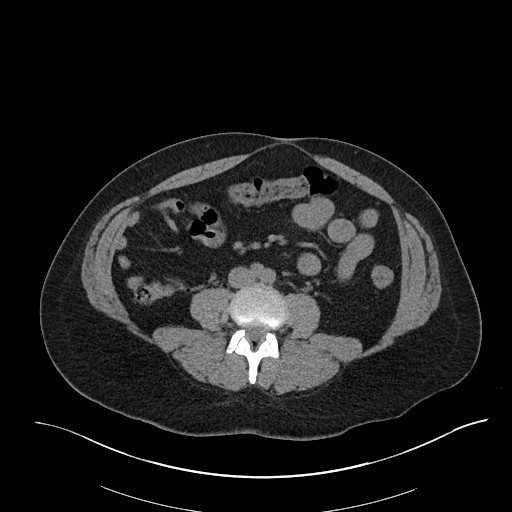
[im 60/103  soft-tissue]
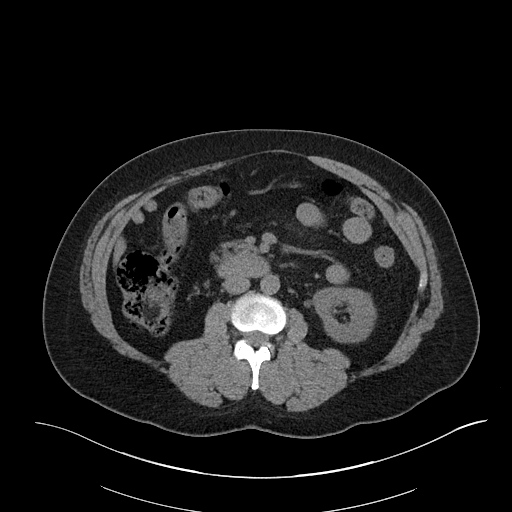
[im 69/103  soft-tissue]
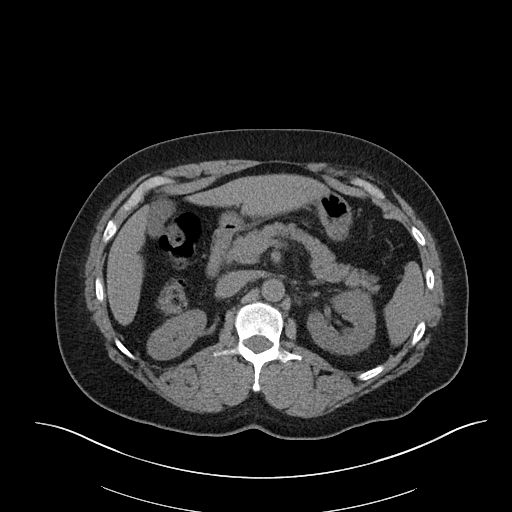
[im 69/103  bone]
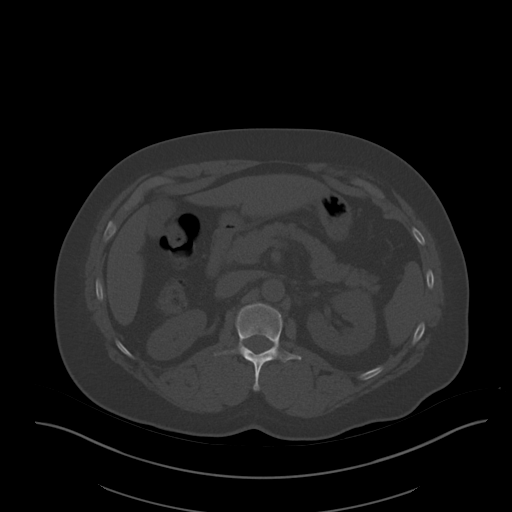
[im 73/103  soft-tissue]
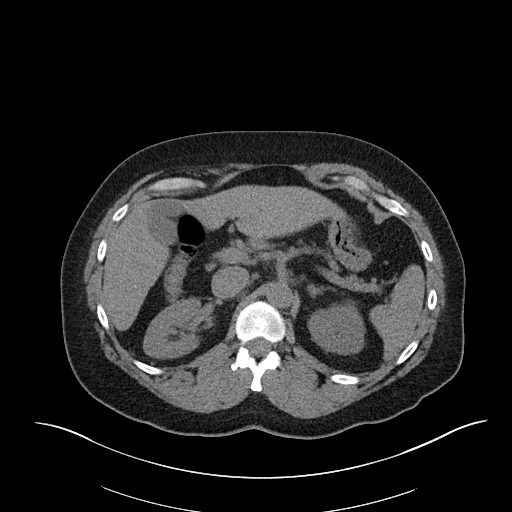
[im 81/103  soft-tissue]
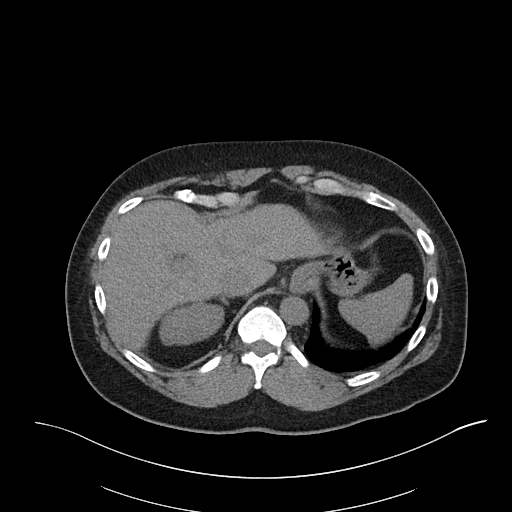
[im 90/103  soft-tissue]
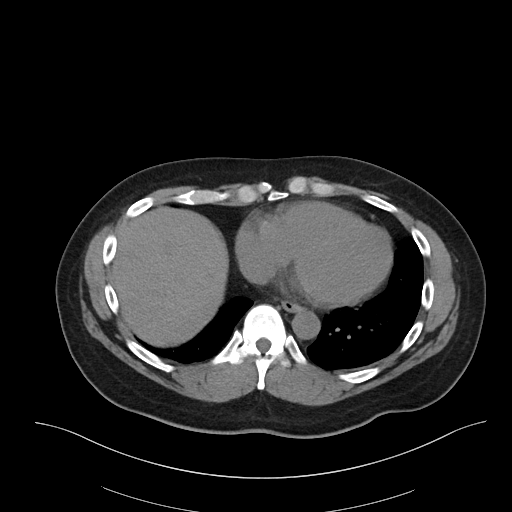
[im 98/103  soft-tissue]
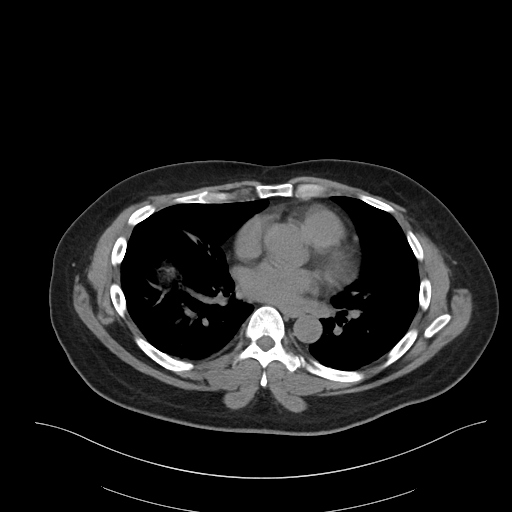

[Series 5: renal stone 3.0 cor · coronal · 0.95mm/px · 3 of 97 slices shown]
[im 33/97  soft-tissue]
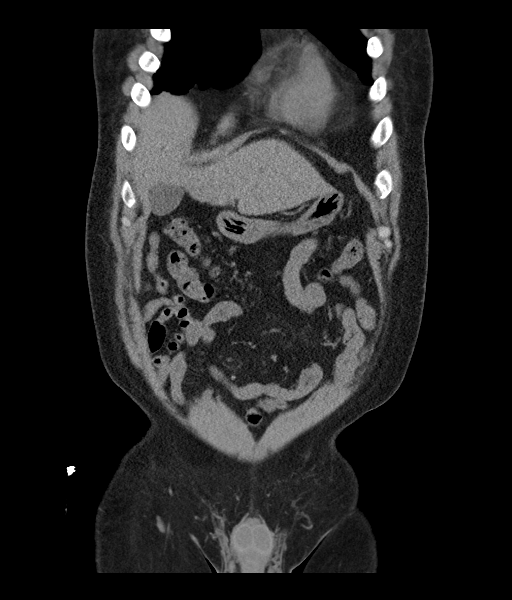
[im 43/97  soft-tissue]
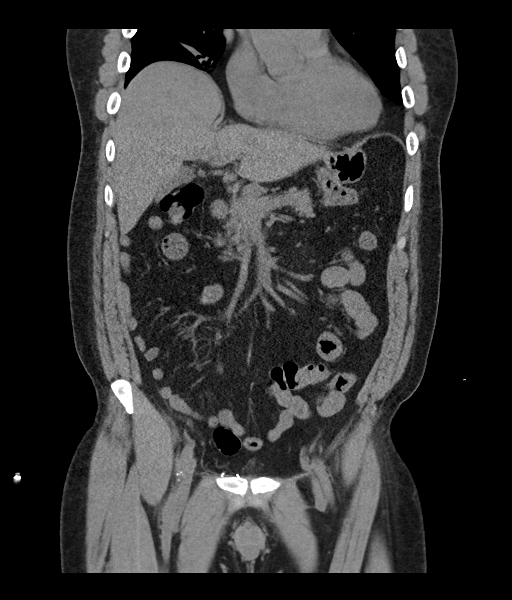
[im 54/97  soft-tissue]
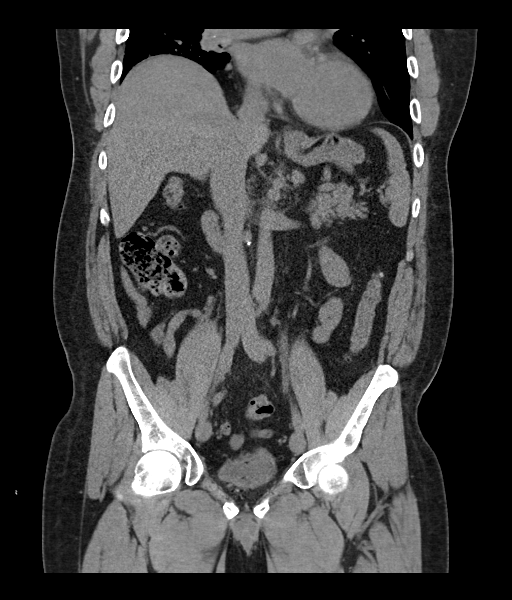

[16 of 46 positions shown; findings below may reference images not displayed]

FINDINGS: Lower chest: Calcified enlarged lymph nodes in the lower mediastinum
and bilateral hila compatible with sarcoidosis.

Hepatobiliary: No focal liver abnormality.No evidence of biliary
obstruction or stone.

Pancreas: Unremarkable.

Spleen: Unremarkable.

Adrenals/Urinary Tract: Negative adrenals. Minimal left hydroureter
with 2 mm stone at the UVJ. 2 small left renal calculi, see coronal
reformats. Cystic density in the upper pole right kidney. No right
hydronephrosis. Unremarkable bladder.

Stomach/Bowel:  No obstruction. No evidence of bowel inflammation.

Vascular/Lymphatic: No acute vascular abnormality. Calcified small
lymph nodes in the porta hepatis and in the small bowel mesentery.
These are likely related to patient's history of sarcoid. There is
mild patchy ground-glass density within mesenteric fat adjacent to
nonenlarged lymph nodes, usually chronic and incidental.

Reproductive:Prostate brachytherapy seeds.

Other: No ascites or pneumoperitoneum. Diastasis rectus with small
fatty midline hernia

Musculoskeletal: No sclerotic bone lesion or acute finding
IMPRESSION: 1. 2 mm stone at the left UVJ with mild left hydroureter.
2. Small left renal calculi.
3. Findings in keeping with history of sarcoid.

## 2020-04-18 ENCOUNTER — Other Ambulatory Visit: Payer: Self-pay | Admitting: Family Medicine

## 2020-04-18 NOTE — Telephone Encounter (Signed)
Pt has upcoming appt 

## 2020-05-11 ENCOUNTER — Encounter: Payer: 59 | Admitting: Family Medicine

## 2020-05-15 ENCOUNTER — Encounter: Payer: 59 | Admitting: Family Medicine

## 2020-05-25 ENCOUNTER — Ambulatory Visit: Payer: 59 | Admitting: Family Medicine

## 2020-05-25 ENCOUNTER — Encounter: Payer: Self-pay | Admitting: Family Medicine

## 2020-05-25 ENCOUNTER — Other Ambulatory Visit: Payer: Self-pay

## 2020-05-25 VITALS — BP 138/88 | HR 87 | Ht 71.0 in | Wt 216.8 lb

## 2020-05-25 DIAGNOSIS — Z1211 Encounter for screening for malignant neoplasm of colon: Secondary | ICD-10-CM | POA: Diagnosis not present

## 2020-05-25 DIAGNOSIS — Z0000 Encounter for general adult medical examination without abnormal findings: Secondary | ICD-10-CM

## 2020-05-25 DIAGNOSIS — Z86010 Personal history of colonic polyps: Secondary | ICD-10-CM

## 2020-05-25 DIAGNOSIS — E7800 Pure hypercholesterolemia, unspecified: Secondary | ICD-10-CM | POA: Diagnosis not present

## 2020-05-25 DIAGNOSIS — I10 Essential (primary) hypertension: Secondary | ICD-10-CM | POA: Diagnosis not present

## 2020-05-25 DIAGNOSIS — E559 Vitamin D deficiency, unspecified: Secondary | ICD-10-CM

## 2020-05-25 DIAGNOSIS — R233 Spontaneous ecchymoses: Secondary | ICD-10-CM

## 2020-05-25 NOTE — Progress Notes (Signed)
Subjective:    Patient ID: Martin Murphy, male    DOB: Apr 22, 1965, 55 y.o.   MRN: [id number]  HPI Chief Complaint  Patient presents with  . Annual Exam    Pt present for physical with fasting labs    He is here for a complete physical exam and to follow-up on chronic health conditions..  Other providers: Urologist- Dr. Lovena Neighbours   Hypertension and reports good daily compliance with medications.  Does not check blood pressure at home.  Hyperlipidemia-reports taking statin daily without any concerns.  Vitamin D deficiency is unclear as to the dose of his current supplement.  States he had a bruise pop up on his arm and is unsure how it got there.   Social history: Lives with wife, works at [employer]  Denies smoking, drinking alcohol, drug use Diet: fairly unhealthy  Exercise: none   Immunizations: declines shingles vaccine   Health maintenance:  Colonoscopy: 2016 Last PSA: followed by urology  Last Dental Exam: every 6 months  Last Eye Exam:  Needs a new doctor  Wears seatbelt always, smoke detectors in home and functioning, does not text while driving, feels safe in home environment.  Reviewed allergies, medications, past medical, surgical, family, and social history.   Review of Systems Review of Systems Constitutional: -fever, -chills, -sweats, -unexpected weight change,-fatigue ENT: -runny nose, -ear pain, -sore throat Cardiology:  -chest pain, -palpitations, -edema Respiratory: -cough, -shortness of breath, -wheezing Gastroenterology: -abdominal pain, -nausea, -vomiting, -diarrhea, -constipation  Hematology: -bleeding or bruising problems Musculoskeletal: -arthralgias, -myalgias, -joint swelling, -back pain Ophthalmology: -vision changes Urology: -dysuria, -difficulty urinating, -hematuria, -urinary frequency, -urgency Neurology: -headache, -weakness, -tingling, -numbness       Objective:   Physical Exam BP 138/88   Pulse 87   Ht 5\' 11"  (1.803 m)   Wt 216  lb 12.8 oz (98.3 kg)   SpO2 97%   BMI 30.24 kg/m   General Appearance:    Alert, cooperative, no distress, appears stated age  Head:    Normocephalic, without obvious abnormality, atraumatic  Eyes:    PERRL, conjunctiva/corneas clear, EOM's intact  Ears:    Normal TM's and external ear canals  Nose:  Mask on  Throat:  Mask on  Neck:   Supple, no lymphadenopathy;  thyroid:  no   enlargement/tenderness/nodules; no  JVD  Back:    Spine nontender, no curvature, ROM normal, no CVA     tenderness  Lungs:     Clear to auscultation bilaterally without wheezes, rales or     ronchi; respirations unlabored  Chest Wall:    No tenderness or deformity   Heart:    Regular rate and rhythm, S1 and S2 normal, no murmur, rub   or gallop  Breast Exam:    No chest wall tenderness, masses or gynecomastia  Abdomen:     Soft, non-tender, nondistended, normoactive bowel sounds,    no masses, no hepatosplenomegaly  Genitalia:   Declines  Rectal:   Declines- has urologist.  Extremities:   No clubbing, cyanosis or edema  Pulses:   2+ and symmetric all extremities  Skin:   Skin color, texture, turgor normal, no rashes or lesions  Lymph nodes:   Cervical, supraclavicular, and axillary nodes normal  Neurologic:   CNII-XII intact, normal strength, sensation and gait; reflexes 2+ and symmetric throughout          Psych:   Normal mood, affect, hygiene and grooming.         Assessment &  Plan:  Routine general medical examination at a health care facility - Plan: CBC with Differential/Platelet, Comprehensive metabolic panel -Preventive health care reviewed.  Counseled on healthy lifestyle including diet and exercise.  He does follow-up regularly with his urologist.  Recommend regular dental and eye exams.  Counseling on immunizations.  Discussed safety.  Primary hypertension - Plan: CBC with Differential/Platelet, Comprehensive metabolic panel -Blood pressure slightly elevated.  Recommend he start checking his  blood pressure at home.  Continue current medication.  Recommend low-sodium diet.  Follow-up his blood pressures not well controlled.  Elevated LDL cholesterol level - Plan: Lipid panel -Continue statin therapy and follow-up pending lipid panel results  Screen for colon cancer - Plan: Ambulatory referral to Gastroenterology  History of colon polyps - Plan: Ambulatory referral to Gastroenterology  Spontaneous bruising - Plan: CBC with Differential/Platelet -Check platelets.  He will let me know if this becomes more of an issue.  No bleeding issues.  Vitamin D deficiency - Plan: VITAMIN D 25 Hydroxy (Vit-D Deficiency, Fractures) -Check vitamin D level and follow-up

## 2020-05-25 NOTE — Patient Instructions (Signed)
Here is a list of Eye Doctors that you call and schedule an appointment with.  Dr. Katy Fitch Address: [address] [place], [place] [address] Phone: [phone]  [facility] Address: [address], [place], [place] [address] Phone: [phone]  [facility] Address: [address], [place], [place] [address] Phone: [phone]   Preventive Care 58-57 Years Old, Male Preventive care refers to lifestyle choices and visits with your health care provider that can promote health and wellness. This includes:  A yearly physical exam. This is also called an annual wellness visit.  Regular dental and eye exams.  Immunizations.  Screening for certain conditions.  Healthy lifestyle choices, such as: ? Eating a healthy diet. ? Getting regular exercise. ? Not using drugs or products that contain nicotine and tobacco. ? Limiting alcohol use. What can I expect for my preventive care visit? Physical exam Your health care provider will check your:  Height and weight. These may be used to calculate your BMI (body mass index). BMI is a measurement that tells if you are at a healthy weight.  Heart rate and blood pressure.  Body temperature.  Skin for abnormal spots. Counseling Your health care provider may ask you questions about your:  Past medical problems.  Family's medical history.  Alcohol, tobacco, and drug use.  Emotional well-being.  Home life and relationship well-being.  Sexual activity.  Diet, exercise, and sleep habits.  Work and work Statistician.  Access to firearms. What immunizations do I need? Vaccines are usually given at various ages, according to a schedule. Your health care provider will recommend vaccines for you based on your age, medical history, and lifestyle or other factors, such as travel or where you work.   What tests do I need? Blood tests  Lipid and cholesterol levels. These may be checked every 5 years, or  more often if you are over 10 years old.  Hepatitis C test.  Hepatitis B test. Screening  Lung cancer screening. You may have this screening every year starting at age 25 if you have a 30-pack-year history of smoking and currently smoke or have quit within the past 15 years.  Prostate cancer screening. Recommendations will vary depending on your family history and other risks.  Genital exam to check for testicular cancer or hernias.  Colorectal cancer screening. ? All adults should have this screening starting at age 69 and continuing until age 53. ? Your health care provider may recommend screening at age 26 if you are at increased risk. ? You will have tests every 1-10 years, depending on your results and the type of screening test.  Diabetes screening. ? This is done by checking your blood sugar (glucose) after you have not eaten for a while (fasting). ? You may have this done every 1-3 years.  STD (sexually transmitted disease) testing, if you are at risk. Follow these instructions at home: Eating and drinking  Eat a diet that includes fresh fruits and vegetables, whole grains, lean protein, and low-fat dairy products.  Take vitamin and mineral supplements as recommended by your health care provider.  Do not drink alcohol if your health care provider tells you not to drink.  If you drink alcohol: ? Limit how much you have to 0-2 drinks a day. ? Be aware of how much alcohol is in your drink. In the [place] one drink equals one 12 oz bottle of beer (355 mL), one 5 oz glass of wine (  148 mL), or one 1 oz glass of hard liquor (44 mL).   Lifestyle  Take daily care of your teeth and gums. Brush your teeth every morning and night with fluoride toothpaste. Floss one time each day.  Stay active. Exercise for at least 30 minutes 5 or more days each week.  Do not use any products that contain nicotine or tobacco, such as cigarettes, e-cigarettes, and chewing tobacco. If you need help  quitting, ask your health care provider.  Do not use drugs.  If you are sexually active, practice safe sex. Use a condom or other form of protection to prevent STIs (sexually transmitted infections).  If told by your health care provider, take low-dose aspirin daily starting at age 53.  Find healthy ways to cope with stress, such as: ? Meditation, yoga, or listening to music. ? Journaling. ? Talking to a trusted person. ? Spending time with friends and family. Safety  Always wear your seat belt while driving or riding in a vehicle.  Do not drive: ? If you have been drinking alcohol. Do not ride with someone who has been drinking. ? When you are tired or distracted. ? While texting.  Wear a helmet and other protective equipment during sports activities.  If you have firearms in your house, make sure you follow all gun safety procedures. What's next?  Go to your health care provider once a year for an annual wellness visit.  Ask your health care provider how often you should have your eyes and teeth checked.  Stay up to date on all vaccines. This information is not intended to replace advice given to you by your health care provider. Make sure you discuss any questions you have with your health care provider. Document Revised: 11/16/2018 Document Reviewed: 02/11/2018 Elsevier Patient Education  2021 [employer].

## 2020-05-26 LAB — CBC WITH DIFFERENTIAL/PLATELET
Basophils Absolute: 0 10*3/uL (ref 0.0–0.2)
Basos: 0 %
EOS (ABSOLUTE): 0.2 10*3/uL (ref 0.0–0.4)
Eos: 3 %
Hematocrit: 42.8 % (ref 37.5–51.0)
Hemoglobin: 14.1 g/dL (ref 13.0–17.7)
Immature Grans (Abs): 0 10*3/uL (ref 0.0–0.1)
Immature Granulocytes: 0 %
Lymphocytes Absolute: 1.4 10*3/uL (ref 0.7–3.1)
Lymphs: 26 %
MCH: 27.8 pg (ref 26.6–33.0)
MCHC: 32.9 g/dL (ref 31.5–35.7)
MCV: 84 fL (ref 79–97)
Monocytes Absolute: 0.4 10*3/uL (ref 0.1–0.9)
Monocytes: 7 %
Neutrophils Absolute: 3.4 10*3/uL (ref 1.4–7.0)
Neutrophils: 64 %
Platelets: 205 10*3/uL (ref 150–450)
RBC: 5.07 x10E6/uL (ref 4.14–5.80)
RDW: 13.2 % (ref 11.6–15.4)
WBC: 5.4 10*3/uL (ref 3.4–10.8)

## 2020-05-26 LAB — COMPREHENSIVE METABOLIC PANEL
ALT: 47 IU/L — ABNORMAL HIGH (ref 0–44)
AST: 36 IU/L (ref 0–40)
Albumin/Globulin Ratio: 1.4 (ref 1.2–2.2)
Albumin: 4.4 g/dL (ref 3.8–4.9)
Alkaline Phosphatase: 130 IU/L — ABNORMAL HIGH (ref 44–121)
BUN/Creatinine Ratio: 12 (ref 9–20)
BUN: 12 mg/dL (ref 6–24)
Bilirubin Total: 0.5 mg/dL (ref 0.0–1.2)
CO2: 22 mmol/L (ref 20–29)
Calcium: 9.2 mg/dL (ref 8.7–10.2)
Chloride: 100 mmol/L (ref 96–106)
Creatinine, Ser: 0.99 mg/dL (ref 0.76–1.27)
Globulin, Total: 3.1 g/dL (ref 1.5–4.5)
Glucose: 89 mg/dL (ref 65–99)
Potassium: 3.7 mmol/L (ref 3.5–5.2)
Sodium: 139 mmol/L (ref 134–144)
Total Protein: 7.5 g/dL (ref 6.0–8.5)
eGFR: 89 mL/min/{1.73_m2} (ref >59)

## 2020-05-26 LAB — LIPID PANEL
Chol/HDL Ratio: 2.9 ratio (ref 0.0–5.0)
Cholesterol, Total: 162 mg/dL (ref 100–199)
HDL: 56 mg/dL (ref >39)
LDL Chol Calc (NIH): 92 mg/dL (ref 0–99)
Triglycerides: 71 mg/dL (ref 0–149)
VLDL Cholesterol Cal: 14 mg/dL (ref 5–40)

## 2020-05-26 LAB — VITAMIN D 25 HYDROXY (VIT D DEFICIENCY, FRACTURES): Vit D, 25-Hydroxy: 31 ng/mL (ref 30.0–100.0)

## 2020-05-27 ENCOUNTER — Encounter: Payer: Self-pay | Admitting: Family Medicine

## 2020-05-27 NOTE — Progress Notes (Signed)
His labs are fine. Liver function is stable. His LDL has bumped up even on the statin so I recommend cutting back some on foods high in fat and increasing his physical activity.

## 2020-07-05 ENCOUNTER — Other Ambulatory Visit: Payer: Self-pay | Admitting: Family Medicine

## 2020-07-19 ENCOUNTER — Other Ambulatory Visit: Payer: Self-pay | Admitting: Family Medicine

## 2020-09-30 ENCOUNTER — Emergency Department
Admission: EM | Admit: 2020-09-30 | Discharge: 2020-09-30 | Disposition: A | Discharge status: Home / Self Care | Payer: 59 | Source: Home / Self Care

## 2020-09-30 ENCOUNTER — Other Ambulatory Visit: Payer: Self-pay

## 2020-09-30 ENCOUNTER — Emergency Department: Admit: 2020-09-30 | Payer: Self-pay

## 2020-09-30 DIAGNOSIS — U071 COVID-19: Secondary | ICD-10-CM | POA: Diagnosis not present

## 2020-09-30 DIAGNOSIS — R509 Fever, unspecified: Secondary | ICD-10-CM | POA: Diagnosis not present

## 2020-09-30 LAB — POCT RAPID STREP A (OFFICE): Rapid Strep A Screen: NEGATIVE

## 2020-09-30 LAB — POC SARS CORONAVIRUS 2 AG -  ED: SARS Coronavirus 2 Ag: POSITIVE — AB

## 2020-09-30 MED ORDER — NIRMATRELVIR/RITONAVIR (PAXLOVID)TABLET: 3.0000 | ORAL_TABLET | Freq: Two times a day (BID) | ORAL | 0 refills | Status: AC

## 2020-09-30 NOTE — ED Provider Notes (Signed)
[name]    CSN: [id number] Arrival date & time: 09/30/20  1246      History   Chief Complaint Chief Complaint  Patient presents with   Headache    Pt states that he has a headache, chills, fever and a cough. X2 days    HPI Martin Murphy is a 55 y.o. male.   HPI 55 year old male presents with headache, cough, chills and fever (currently 103.1) x2 days.  Patient reports he is vaccinated for COVID-19 and has 1 booster.  PMH significant for prostate cancer.  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    Patient Active Problem List   Diagnosis Date Noted   History of colon polyps 05/25/2020   Abdominal discomfort in right lower quadrant 04/02/2018   Testicular mass 04/02/2018   History of prostate cancer 04/02/2018   S/P hernia repair 04/02/2018   Elevated LFTs 04/02/2018   Elevated alkaline phosphatase level 04/02/2018   Tubular adenoma 04/02/2018   Acute low back pain without sciatica 01/26/2018   Chronic right SI joint pain 01/26/2018   Elevated LDL cholesterol level 01/15/2016   Lumbar disc disease 07/16/2015   Chronic lumbar pain 07/02/2015   Vitamin D deficiency 04/27/2015   Low testosterone 04/27/2015   Peripheral neuropathy 02/18/2015   Hereditary and idiopathic peripheral neuropathy 09/20/2014   Bunion of great toe 03/31/2014   Liver dysfunction 12/28/2013   HTN (hypertension) 10/28/2013   Malignant neoplasm of prostate ([place]) 08/02/2013   MGUS (monoclonal gammopathy of unknown significance) 05/31/2013    Past Surgical History:  Procedure Laterality Date   BRONCHOSCOPY  1990's   w/  biopsy's   BUNIONECTOMY     FOOT SURGERY      3 surgeries total   INGUINAL HERNIA REPAIR Bilateral right  02-09-2012/   left  1990's   PROSTATE BIOPSY  12/20/12   gleason 6   PROSTATE BIOPSY  06/07/13   gleason 6, vol 36 gms   RADIOACTIVE SEED IMPLANT N/A 10/20/2013   Procedure: RADIOACTIVE SEED IMPLANT;  Surgeon: Bernestine Amass, MD;  [facility];  Service: Urology;  Laterality: N/A;       Home Medications    Prior to Admission medications   Medication Sig Start Date End Date Taking? Authorizing Provider  Ascorbic Acid (VITAMIN C PO) Take by mouth daily. Reported on 05/25/2015   Yes [provider]  atorvastatin (LIPITOR) 20 MG tablet TAKE 1 TABLET BY MOUTH EVERY DAY 07/05/20  Yes Henson, Vickie L, NP-C  Cholecalciferol (VITAMIN D3 PO) Take by mouth.   Yes [provider]  clobetasol (OLUX) 0.05 % topical foam Apply 1 application topically 2 (two) times daily. 06/20/19  Yes Clark-Burning, Anderson [place], PA-C  hydrochlorothiazide (HYDRODIURIL) 25 MG tablet TAKE 1 TABLET BY MOUTH EVERY DAY 07/19/20  Yes Henson, Vickie L, NP-C  nirmatrelvir/ritonavir EUA (PAXLOVID) TABS Take 3 tablets by mouth 2 (two) times daily for 5 days. Patient GFR is >60. Take nirmatrelvir (150 mg) two tablets twice daily for 5 days and ritonavir (100 mg) one tablet twice daily  for 5 days. 09/30/20 10/05/20 Yes Eliezer Lofts, FNP  Pyridoxine HCl (B-6 PO) Take by mouth.   Yes [provider]  sildenafil (VIAGRA) 50 MG tablet Take 50 mg by mouth daily as needed for erectile dysfunction.   Yes [provider]  tamsulosin (FLOMAX) 0.4 MG CAPS capsule Take 0.4 mg by mouth.   Yes [provider]  vitamin B-12 (CYANOCOBALAMIN) 1000 MCG tablet Take 1,000 mcg by mouth daily. Reported on 05/25/2015 Patient not taking: No sig reported    [provider]    Family History Family History  Problem Relation Age of Onset   Hypertension Brother    Prostate cancer Maternal Uncle        1 had surgery, 1 had  seeds   Cancer Maternal Grandfather        prostate   Diabetes Brother    Colon cancer Neg Hx    Esophageal cancer Neg Hx    Stomach cancer Neg Hx    Rectal cancer Neg Hx     Social History Social History   Tobacco Use   Smoking status: Never   Smokeless tobacco: Never  Vaping Use   Vaping Use: Never used  Substance Use Topics   Alcohol use: No   Drug use: No     Allergies   Patient has no known allergies.   Review of Systems Review of Systems  Constitutional:  Positive for chills and fever.  HENT:  Positive for sore throat.   Neurological:  Positive for headaches.  All other systems reviewed and are negative.   Physical Exam Triage Vital Signs ED Triage Vitals  Enc Vitals Group     BP 09/30/20 1336 (!) 149/90     Pulse Rate 09/30/20 1336 98     Resp --      Temp 09/30/20 1336 (!) 103.1 F (39.5 C)     Temp Source 09/30/20 1336 Oral     SpO2 09/30/20 1336 97 %     Weight 09/30/20 1334 210 lb (95.3 kg)     Height 09/30/20 1334 '5\' 11"'$  (1.803 m)     Head Circumference --      Peak Flow --      Pain Score 09/30/20 1334 8     Pain Loc --      Pain Edu? --      Excl. in [place]? --    No data found.  Updated Vital Signs BP (!) 149/90 (BP Location: Right Arm)   Pulse 98   Temp (!) 103.1 F (39.5 C) (Oral)   Ht '5\' 11"'$  (1.803 m)   Wt 210 lb (95.3 kg)   SpO2 97%   BMI 29.29 kg/m    Physical Exam Vitals and nursing note reviewed.  Constitutional:      General: He is not in acute distress.    Appearance: Normal appearance. He is normal weight. He is not ill-appearing.  HENT:     Head: Normocephalic and atraumatic.     Right Ear: Tympanic membrane, ear canal and external ear normal.     Left Ear: Tympanic membrane, ear canal and external ear normal.     Nose: Nose normal. No congestion or rhinorrhea.     Mouth/Throat:     Mouth: Mucous membranes are moist.     Pharynx: Oropharynx is clear.  Eyes:     Extraocular Movements: Extraocular movements  intact.     Conjunctiva/sclera: Conjunctivae normal.     Pupils: Pupils are equal, round,  and reactive to light.  Cardiovascular:     Rate and Rhythm: Normal rate and regular rhythm.     Pulses: Normal pulses.     Heart sounds: Normal heart sounds. No murmur heard. Pulmonary:     Effort: Pulmonary effort is normal. No respiratory distress.     Breath sounds: No wheezing or rales.  Chest:     Chest wall: No tenderness.  Musculoskeletal:        General: Normal range of motion.     Cervical back: Normal range of motion and neck supple. No tenderness.  Lymphadenopathy:     Cervical: No cervical adenopathy.  Skin:    General: Skin is warm and dry.  Neurological:     General: No focal deficit present.     Mental Status: He is alert and oriented to person, place, and time.  Psychiatric:        Mood and Affect: Mood normal.        Behavior: Behavior normal.     UC Treatments / Results  Labs (all labs ordered are listed, but only abnormal results are displayed) Labs Reviewed  POC SARS CORONAVIRUS 2 AG -  ED - Abnormal; Notable for the following components:      Result Value   SARS Coronavirus 2 Ag Positive (*)    All other components within normal limits  POCT RAPID STREP A (OFFICE)    EKG   Radiology No results found.  Procedures Procedures (including critical care time)  Medications Ordered in UC Medications - No data to display  Initial Impression / Assessment and Plan / UC Course  I have reviewed the triage vital signs and the nursing notes.  Pertinent labs & imaging results that were available during my care of the patient were reviewed by me and considered in my medical decision making (see chart for details).    MDM: 1. Fever-Advised conservative measurements may alternate Tylenol 1000 mg with Ibuprofen 800 mg 1-2 times daily, as needed; 2.  COVID-19-Rx'd Paxlovid, advised patient to self quarantine for 10 days or until 10/11/2020.  Patient discharged home,  hemodynamically stable. Final Clinical Impressions(s) / UC Diagnoses   Final diagnoses:  Fever, unspecified  COVID-19     Discharge Instructions      Advised/instructed patient to take medication as directed with food to completion.  Advised patient to self quarantine for the next 10 days or until 10/11/2020.  Work note provided per patient request.     ED Prescriptions     Medication Sig Dispense Auth. Provider   nirmatrelvir/ritonavir EUA (PAXLOVID) TABS Take 3 tablets by mouth 2 (two) times daily for 5 days. Patient GFR is >60. Take nirmatrelvir (150 mg) two tablets twice daily for 5 days and ritonavir (100 mg) one tablet twice daily for 5 days. 30 tablet Eliezer Lofts, FNP      PDMP not reviewed this encounter.   Eliezer Lofts, [place] 09/30/20 [id number]

## 2020-09-30 NOTE — Discharge Instructions (Addendum)
Advised/instructed patient to take medication as directed with food to completion.  Advised patient to self quarantine for the next 10 days or until 10/11/2020.  Work note provided per patient request.

## 2020-09-30 NOTE — ED Triage Notes (Signed)
Pt states that he has a headache, cough, chills, and fever. X2 days Pt states that he is vaccinated and one booster.

## 2020-10-09 ENCOUNTER — Telehealth: Payer: Self-pay | Admitting: Family Medicine

## 2020-10-09 NOTE — Telephone Encounter (Signed)
Patient left message on my phone that he would like to talk with [place].  Please call him Martin Murphy

## 2020-10-10 ENCOUNTER — Other Ambulatory Visit: Payer: Self-pay

## 2020-10-10 ENCOUNTER — Encounter: Payer: Self-pay | Admitting: Medical

## 2020-10-10 ENCOUNTER — Telehealth (INDEPENDENT_AMBULATORY_CARE_PROVIDER_SITE_OTHER): Payer: 59 | Admitting: Medical

## 2020-10-10 VITALS — Wt 210.0 lb

## 2020-10-10 DIAGNOSIS — Z7184 Encounter for health counseling related to travel: Secondary | ICD-10-CM | POA: Diagnosis not present

## 2020-10-10 DIAGNOSIS — T753XXA Motion sickness, initial encounter: Secondary | ICD-10-CM | POA: Diagnosis not present

## 2020-10-10 MED ORDER — SCOPOLAMINE 1 MG/3DAYS TD PT72: 1.0000 | MEDICATED_PATCH | TRANSDERMAL | 0 refills | Status: DC

## 2020-10-10 NOTE — Progress Notes (Signed)
Subjective:     Patient ID: Martin Murphy, male   DOB: August 31, 1965, 55 y.o.   MRN: [id number]  This visit type was conducted due to national recommendations for restrictions regarding the COVID-19 Pandemic (e.g. social distancing) in an effort to limit this patient's exposure and mitigate transmission in our community.  Due to their co-morbid illnesses, this patient is at least at moderate risk for complications without adequate follow up.  This format is felt to be most appropriate for this patient at this time.    Documentation for virtual audio and video telecommunications through [place] encounter:  The patient was located at home. The provider was located in the office. The patient did consent to this visit and is aware of possible charges through their insurance for this visit.  The other persons participating in this telemedicine service were none. Time spent on call was 20 minutes and in review of previous records 20 minutes total.  This virtual service is not related to other E/M service within previous 7 days.   HPI Chief Complaint  Patient presents with   needs motion sickness    Needs patches. Leaving tomorrow to go to [place] and going to be on a boat and needs motion sickness patches   Virtual consult for patches.  He would like to have scopolamine patch for motion sickness.  He leaves for [place] tomorrow and will be on some boats.  Otherwise been in normal state of health without complaint.  He denies history of glaucoma.  No history of urinary retention.  He has used scopolamine patches in the past without dry mouth blurred vision or other side effects.  No other aggravating or relieving factors. No other complaint.  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 Reported on 05/25/2015     atorvastatin (LIPITOR) 20 MG tablet TAKE 1 TABLET BY MOUTH EVERY DAY 90 tablet 1   Cholecalciferol (VITAMIN D3 PO) Take by mouth.     clobetasol (OLUX) 0.05 % topical foam Apply 1 application topically 2 (two) times daily. 100 g 2   hydrochlorothiazide (HYDRODIURIL) 25 MG tablet TAKE 1 TABLET BY MOUTH EVERY DAY 90 tablet 0   Pyridoxine HCl (B-6 PO) Take by mouth.     sildenafil (VIAGRA) 50 MG tablet Take 50 mg by mouth daily as needed for erectile dysfunction.     tamsulosin (FLOMAX) 0.4 MG CAPS capsule Take 0.4 mg by mouth.     vitamin B-12 (CYANOCOBALAMIN) 1000 MCG tablet Take 1,000 mcg by mouth daily. Reported on 05/25/2015     No current facility-administered medications on file prior to visit.     Review of Systems As in subjective    Objective:   Physical Exam Due to coronavirus pandemic stay at home measures, patient visit was virtual and they were not examined in person.   Gen: wd, wn, nad, well-appearing African-American male     Assessment:  Encounter Diagnoses  Name Primary?   Motion sickness, initial encounter Yes   Travel advice encounter        Plan:     We discussed his travel plans, discussed his concerns.  We discussed the risk and benefits and proper use of scopolamine patch.  We discussed possible serious adverse effects.  I gave him some other recommendations in terms of first-aid kit and safety with international travel.  We discussed removing the scopolamine patch if any potential issues like urinary retention symptoms or dry mouth or blurred vision.  [place] was seen today for needs motion sickness.  Diagnoses and all orders for this visit:  Motion sickness, initial  encounter  Travel advice encounter  Other orders -     scopolamine (TRANSDERM-SCOP, 1.5 MG,) 1 MG/3DAYS; Place 1 patch (1.5 mg total) onto the skin every 3 (three) days.

## 2020-10-10 NOTE — Telephone Encounter (Signed)
Patient called and needs some motion sickness patch. He is leaving tomorrow to go on boat and will be back next week. . Send in  to [employer]

## 2020-10-10 NOTE — Telephone Encounter (Signed)
Pt has an virtual today at 10.15

## 2020-10-14 ENCOUNTER — Other Ambulatory Visit: Payer: Self-pay | Admitting: Family Medicine

## 2020-11-13 ENCOUNTER — Ambulatory Visit: Payer: 59 | Admitting: Family Medicine

## 2020-11-13 ENCOUNTER — Encounter: Payer: Self-pay | Admitting: Family Medicine

## 2020-11-13 ENCOUNTER — Other Ambulatory Visit: Payer: Self-pay

## 2020-11-13 VITALS — BP 130/70 | HR 76 | Wt 213.0 lb

## 2020-11-13 DIAGNOSIS — R7989 Other specified abnormal findings of blood chemistry: Secondary | ICD-10-CM

## 2020-11-13 DIAGNOSIS — E559 Vitamin D deficiency, unspecified: Secondary | ICD-10-CM

## 2020-11-13 DIAGNOSIS — R0683 Snoring: Secondary | ICD-10-CM

## 2020-11-13 DIAGNOSIS — I10 Essential (primary) hypertension: Secondary | ICD-10-CM | POA: Diagnosis not present

## 2020-11-13 DIAGNOSIS — E7800 Pure hypercholesterolemia, unspecified: Secondary | ICD-10-CM | POA: Diagnosis not present

## 2020-11-13 DIAGNOSIS — Z9189 Other specified personal risk factors, not elsewhere classified: Secondary | ICD-10-CM

## 2020-11-13 DIAGNOSIS — G478 Other sleep disorders: Secondary | ICD-10-CM

## 2020-11-13 NOTE — Progress Notes (Signed)
   Subjective:    Patient ID: Martin Murphy, male    DOB: [birth year], 55 y.o.   MRN: [id number]  HPI Chief Complaint  Patient presents with   med check    Fasting med check    He is here with complaints of snoring and waking up tired. Morning headaches some days. Would like to be tested for sleep apnea and denies ever being tested. . Falls asleep when sitting during the day some times.   He is also here for a medication management visit.   HTN- taking medication without concerns.  Does not check BP at home.   HL- taking statin and no side effects.   States GI told him he is not due for colonoscopy until next year.   Vitamin D def- is not currently on a supplement.   Denies fever, chills, dizziness, chest pain, palpitations, shortness of breath, abdominal pain, N/V/D, urinary symptoms, LE edema.   Reviewed allergies, medications, past medical, surgical, family, and social history.    Review of Systems Pertinent positives and negatives in the history of present illness.     Objective:   Physical Exam BP 130/70   Pulse 76   Wt 213 lb (96.6 kg)   BMI 29.71 kg/m   Alert and in no distress. Cardiac exam shows a regular sinus rhythm without murmurs or gallops. Lungs are clear to auscultation. Extremities without edema. Skin is warm and dry. Normal mood.   Epworth sleepiness scale- 16      Assessment & Plan:  Primary hypertension - Plan: CBC with Differential/Platelet, Comprehensive metabolic panel  Vitamin D deficiency - Plan: VITAMIN D 25 Hydroxy (Vit-D Deficiency, Fractures)  Elevated LDL cholesterol level - Plan: Lipid panel  Elevated LFTs - Plan: Comprehensive metabolic panel  Snoring - Plan: Home sleep test  At risk for sleep apnea - Plan: Home sleep test  Non-restorative sleep - Plan: Home sleep test  Suspect he has sleep apnea and I will order a HST.  HTN controlled. Continue medication and keep an eye on BP at home.  Continue statin therapy. Low fat  diet recommended.  Followed by GI for elevated LFTs. Check labs and follow up.   Declines flu vaccine

## 2020-11-14 LAB — CBC WITH DIFFERENTIAL/PLATELET
Basophils Absolute: 0 10*3/uL (ref 0.0–0.2)
Basos: 0 %
EOS (ABSOLUTE): 0.1 10*3/uL (ref 0.0–0.4)
Eos: 3 %
Hematocrit: 39.6 % (ref 37.5–51.0)
Hemoglobin: 13.3 g/dL (ref 13.0–17.7)
Immature Grans (Abs): 0 10*3/uL (ref 0.0–0.1)
Immature Granulocytes: 0 %
Lymphocytes Absolute: 1.5 10*3/uL (ref 0.7–3.1)
Lymphs: 31 %
MCH: 28.2 pg (ref 26.6–33.0)
MCHC: 33.6 g/dL (ref 31.5–35.7)
MCV: 84 fL (ref 79–97)
Monocytes Absolute: 0.5 10*3/uL (ref 0.1–0.9)
Monocytes: 9 %
Neutrophils Absolute: 2.8 10*3/uL (ref 1.4–7.0)
Neutrophils: 57 %
Platelets: 212 10*3/uL (ref 150–450)
RBC: 4.72 x10E6/uL (ref 4.14–5.80)
RDW: 13.6 % (ref 11.6–15.4)
WBC: 5 10*3/uL (ref 3.4–10.8)

## 2020-11-14 LAB — COMPREHENSIVE METABOLIC PANEL
ALT: 28 IU/L (ref 0–44)
AST: 28 IU/L (ref 0–40)
Albumin/Globulin Ratio: 1.4 (ref 1.2–2.2)
Albumin: 4.2 g/dL (ref 3.8–4.9)
Alkaline Phosphatase: 113 IU/L (ref 44–121)
BUN/Creatinine Ratio: 11 (ref 9–20)
BUN: 11 mg/dL (ref 6–24)
Bilirubin Total: 0.6 mg/dL (ref 0.0–1.2)
CO2: 26 mmol/L (ref 20–29)
Calcium: 9.4 mg/dL (ref 8.7–10.2)
Chloride: 101 mmol/L (ref 96–106)
Creatinine, Ser: 0.99 mg/dL (ref 0.76–1.27)
Globulin, Total: 3 g/dL (ref 1.5–4.5)
Glucose: 83 mg/dL (ref 65–99)
Potassium: 4.4 mmol/L (ref 3.5–5.2)
Sodium: 139 mmol/L (ref 134–144)
Total Protein: 7.2 g/dL (ref 6.0–8.5)
eGFR: 89 mL/min/{1.73_m2} (ref >59)

## 2020-11-14 LAB — VITAMIN D 25 HYDROXY (VIT D DEFICIENCY, FRACTURES): Vit D, 25-Hydroxy: 39.4 ng/mL (ref 30.0–100.0)

## 2020-11-14 LAB — LIPID PANEL
Chol/HDL Ratio: 3 ratio (ref 0.0–5.0)
Cholesterol, Total: 149 mg/dL (ref 100–199)
HDL: 50 mg/dL (ref >39)
LDL Chol Calc (NIH): 86 mg/dL (ref 0–99)
Triglycerides: 62 mg/dL (ref 0–149)
VLDL Cholesterol Cal: 13 mg/dL (ref 5–40)

## 2020-11-14 NOTE — Progress Notes (Signed)
His labs look good.

## 2020-12-24 ENCOUNTER — Other Ambulatory Visit: Payer: Self-pay

## 2020-12-24 ENCOUNTER — Ambulatory Visit (HOSPITAL_BASED_OUTPATIENT_CLINIC_OR_DEPARTMENT_OTHER): Payer: 59 | Admitting: Internal Medicine

## 2020-12-24 DIAGNOSIS — G478 Other sleep disorders: Secondary | ICD-10-CM

## 2020-12-24 DIAGNOSIS — R0683 Snoring: Secondary | ICD-10-CM

## 2020-12-24 DIAGNOSIS — Z9189 Other specified personal risk factors, not elsewhere classified: Secondary | ICD-10-CM

## 2020-12-27 ENCOUNTER — Telehealth: Payer: 59 | Admitting: Family Medicine

## 2020-12-27 ENCOUNTER — Other Ambulatory Visit: Payer: Self-pay

## 2020-12-31 ENCOUNTER — Other Ambulatory Visit: Payer: Self-pay

## 2020-12-31 MED ORDER — ATORVASTATIN CALCIUM 20 MG PO TABS: 20.0000 mg | ORAL_TABLET | Freq: Every day | ORAL | 1 refills | Status: DC

## 2021-01-01 ENCOUNTER — Other Ambulatory Visit: Payer: Self-pay

## 2021-01-01 ENCOUNTER — Ambulatory Visit (HOSPITAL_BASED_OUTPATIENT_CLINIC_OR_DEPARTMENT_OTHER): Payer: 59 | Admitting: Internal Medicine

## 2021-01-01 VITALS — Ht 71.0 in | Wt 210.0 lb

## 2021-01-02 ENCOUNTER — Other Ambulatory Visit: Payer: Self-pay

## 2021-01-02 MED ORDER — ATORVASTATIN CALCIUM 20 MG PO TABS: 20.0000 mg | ORAL_TABLET | Freq: Every day | ORAL | 1 refills | Status: DC

## 2021-01-03 ENCOUNTER — Ambulatory Visit (HOSPITAL_BASED_OUTPATIENT_CLINIC_OR_DEPARTMENT_OTHER): Payer: 59 | Attending: Family Medicine | Admitting: Internal Medicine

## 2021-01-03 VITALS — Ht 71.0 in | Wt 210.0 lb

## 2021-01-03 DIAGNOSIS — G4733 Obstructive sleep apnea (adult) (pediatric): Secondary | ICD-10-CM | POA: Diagnosis present

## 2021-01-10 ENCOUNTER — Other Ambulatory Visit: Payer: Self-pay

## 2021-01-10 MED ORDER — HYDROCHLOROTHIAZIDE 25 MG PO TABS: 25.0000 mg | ORAL_TABLET | Freq: Every day | ORAL | 1 refills | Status: DC

## 2021-01-19 DIAGNOSIS — R0683 Snoring: Secondary | ICD-10-CM | POA: Diagnosis not present

## 2021-01-19 NOTE — Procedures (Signed)
   Patient Name: Martin Murphy, Martin Murphy Date: 01/10/2021 Gender: Male D.O.B: 1966/01/12 Age (years): 40 Referring Provider: [place] Rm NP Height (inches): 94 Interpreting Physician: Baird Lyons MD, ABSM Weight (lbs): 210 RPSGT: Gerhard Perches BMI: 29 MRN: [id number] Neck Size: <br>  CLINICAL INFORMATION Sleep Study Type: HST Indication for sleep study: OSA Epworth Sleepiness Score: 12  SLEEP STUDY TECHNIQUE A multi-channel overnight portable sleep study was performed. The channels recorded were: nasal airflow, thoracic respiratory movement, and oxygen saturation with a pulse oximetry. Snoring was also monitored.  MEDICATIONS Patient self administered medications include: none reported.  SLEEP ARCHITECTURE Patient was studied for 381 minutes. The sleep efficiency was 100.0 % and the patient was supine for 91.3%. The arousal index was 0.0 per hour.  RESPIRATORY PARAMETERS The overall AHI was 24.6 per hour, with a central apnea index of 0 per hour. The oxygen nadir was 63% during sleep.  CARDIAC DATA Mean heart rate during sleep was 75.1 bpm.  IMPRESSIONS - Moderate obstructive sleep apnea occurred during this study (AHI = 24.6/h). - Oxygen desaturation was noted during this study (Min O2 = 63%). Mean O2 saturation 92%. Time with O2 saturation 89% or less was 27 minutes. - Patient snored.  DIAGNOSIS - Obstructive Sleep Apnea (G47.33) - Nocturnal Hypoxemia (G47.36)  RECOMMENDATIONS - Suggest CPAP titration sleep study or autopap. Other options would be based on clincal judgment. - Be careful with alcohol, sedatives and other CNS depressants that may worsen sleep apnea and disrupt normal sleep architecture. - Sleep hygiene should be reviewed to assess factors that may improve sleep quality. - Weight management and regular exercise should be initiated or continued.  [Electronically signed] 01/19/2021 11:41 AM  Baird Lyons MD, ABSM Diplomate, American Board of  Sleep Medicine   NPI: [id number]                          [place] [place] [facility]  ELECTRONICALLY SIGNED ON:  01/19/2021, 11:35 AM [place] PH: [phone]   FX: [phone] [place]

## 2021-01-28 NOTE — Progress Notes (Signed)
ppp

## 2021-05-27 NOTE — Progress Notes (Signed)
? ?Complete physical exam ? ? ?Patient: Martin Murphy   DOB: [birth year]   56 y.o. Male  MRN: [id number] ?Visit Date: 05/28/2021 ? ?Chief Complaint  ?Patient presents with  ? Annual Exam  ?  Fasting CPE - No other concerns  ? ?Subjective  ?  ?Martin Murphy is a 56 y.o. male who presents today for a complete physical exam.  ? ?Reports he is generally feeling well; is not currently eating a healthy diet; is sleeping well about 5 hours a night; drinks 1 bottle of water a day; no regular exercise routine right now. ? ?Reports a sore spot on right side of chest for a few weeks, no pain today; has gall bladder and appendix; denies injury. ?Reports a pain in left anterior ankle and lower leg that has resolved; denies swelling of feet or ankles; denies injury. ?Colonoscopy due 12/2021 ? ? ?HPI ?HPI   ? ? Annual Exam   ? Additional comments: Fasting CPE - No other concerns ? ?  ?  ?Last edited by Deforest Hoyles, [place] on 05/28/2021  9:47 AM.  ?  ?  ? ? ?Past Medical History:  ?Diagnosis Date  ? At risk for sleep apnea   ? STOP-BANG=5    SENT TO PCP 10-13-2013  ? Bunion   ? bilateral great toe  ? Hereditary and idiopathic peripheral neuropathy 09/20/2014  ? HTN (hypertension) 10/28/2013  ? Hypertension   ? Nocturia   ? Obstructive sleep apnea 05/28/2021  ? Prostate cancer ([place]) DX  12/20/2012  ? biopsies x 2, Gleason 3+3=6  vol 36  ? Sarcoidosis of lung ([place])   ? per lung bx 1990's  ? ?Past Surgical History:  ?Procedure Laterality Date  ? BRONCHOSCOPY  1990's  ? w/  biopsy's  ? BUNIONECTOMY    ? FOOT SURGERY    ? 3 surgeries total  ? INGUINAL HERNIA REPAIR Bilateral right  02-09-2012/   left  1990's  ? PROSTATE BIOPSY  12/20/12  ? gleason 6  ? PROSTATE BIOPSY  06/07/13  ? gleason 6, vol 36 gms  ? RADIOACTIVE SEED IMPLANT N/A 10/20/2013  ? Procedure: RADIOACTIVE SEED IMPLANT;  Surgeon: Bernestine Amass, MD;  Location: [facility];  Service: Urology;  Laterality: N/A;  ? ?Social History  ? ?Socioeconomic History  ? Marital  status: Divorced  ?  Spouse name: Not on file  ? Number of children: Not on file  ? Years of education: Not on file  ? Highest education level: Not on file  ?Occupational History  ? Not on file  ?Tobacco Use  ? Smoking status: Never  ? Smokeless tobacco: Never  ?Vaping Use  ? Vaping Use: Never used  ?Substance and Sexual Activity  ? Alcohol use: No  ? Drug use: No  ? Sexual activity: Yes  ?Other Topics Concern  ? Not on file  ?Social History Narrative  ? Not on file  ? ?Social Determinants of Health  ? ?Financial Resource Strain: Not on file  ?Food Insecurity: Not on file  ?Transportation Needs: Not on file  ?Physical Activity: Not on file  ?Stress: Not on file  ?Social Connections: Not on file  ?Intimate Partner Violence: Not on file  ? ?Family Status  ?Relation Name Status  ? Mother  Alive  ? Father  Deceased at age 7  ? Sister 2 Alive  ? Brother  Alive  ? Brother  Alive  ? Mat Uncle 2 Alive  ? MGF  Deceased  ?  Neg Hx  (Not Specified)  ? ?Family History  ?Problem Relation Age of Onset  ? Hypertension Brother   ? Diabetes Brother   ? Prostate cancer Maternal Uncle   ?     1 had surgery, 1 had seeds  ? Cancer Maternal Grandfather   ?     prostate  ? Colon cancer Neg Hx   ? Esophageal cancer Neg Hx   ? Stomach cancer Neg Hx   ? Rectal cancer Neg Hx   ? Hyperlipidemia Neg Hx   ? CVA Neg Hx   ? Heart attack Neg Hx   ? ?No Known Allergies  ?Patient Care Team: ?Marcellina Millin as PCP - General (Physician Assistant) ?Unice Bailey, MD as Referring Physician (Rheumatology) ?Drema Dallas, MD as Referring Physician (Physical Medicine and Rehabilitation) ?[place] ?Deneise Lever, MD as Consulting Physician (Pulmonary Disease)  ? ?Medications: ?Outpatient Medications Prior to Visit  ?Medication Sig  ? Ascorbic Acid (VITAMIN C PO) Take by mouth daily. Reported on 05/25/2015  ? Cholecalciferol (VITAMIN D3 PO) Take by mouth.  ? sildenafil (VIAGRA) 50 MG tablet Take 50 mg by mouth  daily as needed for erectile dysfunction.  ? tamsulosin (FLOMAX) 0.4 MG CAPS capsule Take 0.4 mg by mouth.  ? vitamin B-12 (CYANOCOBALAMIN) 1000 MCG tablet Take 1,000 mcg by mouth daily. Reported on 05/25/2015  ? [DISCONTINUED] atorvastatin (LIPITOR) 20 MG tablet Take 1 tablet (20 mg total) by mouth daily.  ? [DISCONTINUED] hydrochlorothiazide (HYDRODIURIL) 25 MG tablet Take 1 tablet (25 mg total) by mouth daily.  ? clobetasol (OLUX) 0.05 % topical foam Apply 1 application topically 2 (two) times daily. (Patient not taking: Reported on 05/28/2021)  ? Pyridoxine HCl (B-6 PO) Take by mouth. (Patient not taking: Reported on 05/28/2021)  ? ?No facility-administered medications prior to visit.  ? ? ?Review of Systems  ?Constitutional:  Negative for activity change and fever.  ?HENT:  Negative for congestion, ear pain and voice change.   ?Eyes:  Negative for redness.  ?Respiratory:  Negative for cough.   ?Cardiovascular:  Negative for chest pain.  ?Gastrointestinal:  Negative for constipation and diarrhea.  ?Endocrine: Negative for polyuria.  ?Genitourinary:  Negative for flank pain.  ?Musculoskeletal:  Positive for arthralgias and myalgias. Negative for gait problem and neck stiffness.  ?Skin:  Negative for color change and rash.  ?Neurological:  Negative for dizziness.  ?Hematological:  Negative for adenopathy.  ?Psychiatric/Behavioral:  Negative for agitation, behavioral problems and confusion.   ? ?Last CBC ?Lab Results  ?Component Value Date  ? WBC 5.0 11/13/2020  ? HGB 13.3 11/13/2020  ? HCT 39.6 11/13/2020  ? MCV 84 11/13/2020  ? MCH 28.2 11/13/2020  ? RDW 13.6 11/13/2020  ? PLT 212 11/13/2020  ? ?Last metabolic panel ?Lab Results  ?Component Value Date  ? GLUCOSE 83 11/13/2020  ? NA 139 11/13/2020  ? K 4.4 11/13/2020  ? CL 101 11/13/2020  ? CO2 26 11/13/2020  ? BUN 11 11/13/2020  ? CREATININE 0.99 11/13/2020  ? EGFR 89 11/13/2020  ? CALCIUM 9.4 11/13/2020  ? PROT 7.2 11/13/2020  ? ALBUMIN 4.2 11/13/2020  ? LABGLOB  3.0 11/13/2020  ? AGRATIO 1.4 11/13/2020  ? BILITOT 0.6 11/13/2020  ? ALKPHOS 113 11/13/2020  ? AST 28 11/13/2020  ? ALT 28 11/13/2020  ? ANIONGAP 10 10/06/2018  ? ?Last lipids ?Lab Results  ?Component Value Date  ? CHOL 149 11/13/2020  ? HDL 50 11/13/2020  ? [place]  86 11/13/2020  ? TRIG 62 11/13/2020  ? CHOLHDL 3.0 11/13/2020  ? ?Last hemoglobin A1c ?No results found for: HGBA1C ?  ? ?The 10-year ASCVD risk score (Arnett DK, et al., 2019) is: 11% ? ? Objective  ?  ?BP 128/88   Pulse 73   Wt 209 lb (94.8 kg)   SpO2 97%   BMI 29.15 kg/m?  ? ?BP Readings from Last 5 Encounters:  ?05/28/21 128/88  ?11/13/20 130/70  ?09/30/20 (!) 149/90  ?05/25/20 138/88  ?05/11/19 120/70  ? ? ?  ? ? ?Physical Exam ?Vitals and nursing note reviewed.  ?Constitutional:   ?   General: He is not in acute distress. ?   Appearance: Normal appearance.  ?HENT:  ?   Head: Normocephalic and atraumatic.  ?   Right Ear: Tympanic membrane, ear canal and external ear normal.  ?   Left Ear: Tympanic membrane, ear canal and external ear normal.  ?   Nose: No congestion.  ?Eyes:  ?   Extraocular Movements: Extraocular movements intact.  ?   Conjunctiva/sclera: Conjunctivae normal.  ?   Pupils: Pupils are equal, round, and reactive to light.  ?Neck:  ?   Vascular: No carotid bruit.  ?Cardiovascular:  ?   Rate and Rhythm: Normal rate and regular rhythm.  ?   Pulses: Normal pulses.  ?   Heart sounds: Normal heart sounds.  ?Pulmonary:  ?   Effort: Pulmonary effort is normal.  ?   Breath sounds: Normal breath sounds. No wheezing.  ?Abdominal:  ?   General: Bowel sounds are normal.  ?   Palpations: Abdomen is soft.  ?   Tenderness: There is no abdominal tenderness.  ?   Comments: Small mobile superficial mass in RUQ, will monitor  ?Musculoskeletal:     ?   General: Normal range of motion.  ?   Cervical back: Normal range of motion and neck supple.  ?   Right lower leg: No edema.  ?   Left lower leg: No edema.  ?Skin: ?   General: Skin is warm and dry.  ?    Findings: No rash.  ?Neurological:  ?   Mental Status: He is alert and oriented to person, place, and time.  ?   Gait: Gait normal.  ?Psychiatric:     ?   Mood and Affect: Mood normal.     ?   Behavior: B

## 2021-05-28 ENCOUNTER — Ambulatory Visit: Payer: 59 | Admitting: Physician Assistant

## 2021-05-28 ENCOUNTER — Encounter: Payer: Self-pay | Admitting: Physician Assistant

## 2021-05-28 VITALS — BP 128/88 | HR 73 | Ht 71.0 in | Wt 209.0 lb

## 2021-05-28 DIAGNOSIS — E782 Mixed hyperlipidemia: Secondary | ICD-10-CM

## 2021-05-28 DIAGNOSIS — R229 Localized swelling, mass and lump, unspecified: Secondary | ICD-10-CM | POA: Diagnosis not present

## 2021-05-28 DIAGNOSIS — G4733 Obstructive sleep apnea (adult) (pediatric): Secondary | ICD-10-CM | POA: Diagnosis not present

## 2021-05-28 DIAGNOSIS — Z0000 Encounter for general adult medical examination without abnormal findings: Secondary | ICD-10-CM | POA: Diagnosis not present

## 2021-05-28 DIAGNOSIS — I10 Essential (primary) hypertension: Secondary | ICD-10-CM | POA: Diagnosis not present

## 2021-05-28 DIAGNOSIS — Z6829 Body mass index (BMI) 29.0-29.9, adult: Secondary | ICD-10-CM | POA: Insufficient documentation

## 2021-05-28 HISTORY — DX: Obstructive sleep apnea (adult) (pediatric): G47.33

## 2021-05-28 MED ORDER — HYDROCHLOROTHIAZIDE 25 MG PO TABS: 25.0000 mg | ORAL_TABLET | Freq: Every day | ORAL | 1 refills | Status: DC

## 2021-05-28 MED ORDER — ATORVASTATIN CALCIUM 20 MG PO TABS: 20.0000 mg | ORAL_TABLET | Freq: Every day | ORAL | 1 refills | Status: DC

## 2021-05-28 NOTE — Assessment & Plan Note (Addendum)
Stable, patient to follow up with Dr. Baird Lyons, Pulmonologist's office, whether he needs a CPAP machine ?

## 2021-05-28 NOTE — Assessment & Plan Note (Addendum)
Will monitor, RUQ abdomen, no tenderness to palpation today; if worse will order ultrasound for further evaluation ?

## 2021-05-28 NOTE — Assessment & Plan Note (Signed)
controlled, continue HCTZ 25 mg qd, eat a low salt diet, do not add any salt to food when cooking, avoid processed foods, avoid fried foods ? ?

## 2021-05-28 NOTE — Assessment & Plan Note (Signed)
controlled, continue atorvastatin 20 mg qd, eat a low fat diet, increase fiber intake (Benefiber or Metamucil, Cherrios,  oatmeal, beans, nuts, fruits and vegetables), limit saturated fats (in fried foods, red meat), can add OTC fish oil supplement, eat fish with Omega-3 fatty acids like salmon and tuna, exercise for 30 minutes 3 - 5 times a week, drink 8 - 10 glasses of water a day ? ? ?

## 2021-05-28 NOTE — Patient Instructions (Addendum)
Eat a low salt diet and minimize processed foods (examples canned vegetables, lunch meats, fast food), drink 8 glasses of water a day, exercise 3 - 5 times a week (example walking 30 minutes daily), decrease caffeine intake ? ?Preventative Care for Adults, Male ?   ?   REGULAR HEALTH EXAMS: ?A routine yearly physical is a good way to check in with your primary care provider about your health and preventive screening. It is also an opportunity to share updates about your health and any concerns you have, and receive a thorough all-over exam.  ?Most health insurance companies pay for at least some preventative services.  Check with your health plan for specific coverages. ? ?WHAT PREVENTATIVE SERVICES DO MEN NEED? ?Adult men should have their weight and blood pressure checked regularly.  ?Men age 80 and older should have their cholesterol levels checked regularly. ?Beginning at age 40 and continuing to age 53, men should be screened for colorectal cancer.  Certain people should may need continued testing until age 5. ?Other cancer screening may include exams for testicular and prostate cancer. ?Updating vaccinations is part of preventative care.  Vaccinations help protect against diseases such as the flu. ?Lab tests are generally done as part of preventative care to screen for anemia and blood disorders, to screen for problems with the kidneys and liver, to screen for bladder problems, to check blood sugar, and to check your cholesterol level. ?Preventative services generally include counseling about diet, exercise, avoiding tobacco, drugs, excessive alcohol consumption, and sexually transmitted infections.   ? ?GENERAL RECOMMENDATIONS FOR GOOD HEALTH: ? ?Healthy diet: ?Eat a variety of foods, including fruit, vegetables, animal or vegetable protein, such as meat, fish, chicken, and eggs, or beans, lentils, tofu, and grains, such as rice. ?Drink plenty of water daily (60 - 80 ounces or 8 - 10 glasses of water a  day) ?Decrease saturated fat in the diet, avoid lots of red meat, processed foods, sweets, fast foods, and fried foods. For high cholesterol - Increase fiber intake (Benefiber or Metamucil, Cherrios,  oatmeal, beans, nuts, fruits and vegetables), limit saturated fats (in fried foods, red meat), can add OTC fish oil supplement, eat fish with Omega-3 fatty acids like salmon and tuna, exercise for 30 minutes 3 - 5 times a week, drink 8 - 10 glasses of water a day. ? ?Exercise: ?Aerobic exercise helps maintain good heart health. At least 30-40 minutes of moderate-intensity exercise is recommended. For example, a brisk walk that increases your heart rate and breathing. This should be done on most days of the week.  ?Find a type of exercise or a variety of exercises that you enjoy so that it becomes a part of your daily life.  Examples are running, walking, swimming, water aerobics, and biking.  For motivation and support, explore group exercise such as aerobic class, spin class, Zumba, Yoga,or  martial arts, etc.   ?Set exercise goals for yourself, such as a certain weight goal, walk or run in a race such as a 5k walk/run.  Speak to your primary care provider about exercise goals. ? ?Disease prevention: ?If you smoke or chew tobacco, find out from your caregiver how to quit. It can literally save your life, no matter how long you have been a tobacco user. If you do not use tobacco, never begin.  ?Maintain a healthy diet and normal weight. Increased weight leads to problems with blood pressure and diabetes.  ?The Body Mass Index or BMI is a way of measuring  how much of your body is fat. Having a BMI above 27 increases the risk of heart disease, diabetes, hypertension, stroke and other problems related to obesity. Your caregiver can help determine your BMI and based on it develop an exercise and dietary program to help you achieve or maintain this important measurement at a healthful level. ?High blood pressure causes  heart and blood vessel problems.  Persistent high blood pressure should be treated with medicine if weight loss and exercise do not work.  ?Fat and cholesterol leaves deposits in your arteries that can block them. This causes heart disease and vessel disease elsewhere in your body.  If your cholesterol is found to be high, or if you have heart disease or certain other medical conditions, then you may need to have your cholesterol monitored frequently and be treated with medication.  ?Ask if you should have a stress test if your history suggests this. A stress test is a test done on a treadmill that looks for heart disease. This test can find disease prior to there being a problem. ?Avoid drinking alcohol in excess (more than two drinks per day).  Avoid use of street drugs. Do not share needles with anyone. Ask for professional help if you need assistance or instructions on stopping the use of alcohol, cigarettes, and/or drugs. ?Brush your teeth twice a day with fluoride toothpaste, and floss once a day. Good oral hygiene prevents tooth decay and gum disease. The problems can be painful, unattractive, and can cause other health problems. Visit your dentist for a routine oral and dental check up and preventive care every 6-12 months.  ?Look at your skin regularly.  Use a mirror to look at your back. Notify your caregivers of changes in moles, especially if there are changes in shapes, colors, a size larger than a pencil eraser, an irregular border, or development of new moles. ? ?Safety: ?Use seatbelts 100% of the time, whether driving or as a passenger.  Use safety devices such as hearing protection if you work in environments with loud noise or significant background noise.  Use safety glasses when doing any work that could send debris in to the eyes.  Use a helmet if you ride a bike or motorcycle.  Use appropriate safety gear for contact sports.  Talk to your caregiver about gun safety. ?Use sunscreen with a SPF  (or skin protection factor) of 15 or greater.  Lighter skinned people are at a greater risk of skin cancer. Don?t forget to also wear sunglasses in order to protect your eyes from too much damaging sunlight. Damaging sunlight can accelerate cataract formation.  ?Practice safe sex. Use condoms. Condoms are used for birth control and to help reduce the spread of sexually transmitted infections (or STIs).  Some of the STIs are gonorrhea (the clap), chlamydia, syphilis, trichomonas, herpes, HPV (human papilloma virus) and HIV (human immunodeficiency virus) which causes AIDS. The herpes, HIV and HPV are viral illnesses that have no cure. These can result in disability, cancer and death.  ?Keep carbon monoxide and smoke detectors in your home functioning at all times. Change the batteries every 6 months or use a model that plugs into the wall.  ? ?Vaccinations: ?Stay up to date with your tetanus shots and other required immunizations. You should have a booster for tetanus every 10 years. Be sure to get your flu shot every year, since 5%-20% of the [place] population comes down with the flu. The flu vaccine changes each year, so being  vaccinated once is not enough. Get your shot in the fall, before the flu season peaks.   ?Other vaccines to consider: ?Pneumococcal vaccine to protect against certain types of pneumonia.  This is normally recommended for adults age 73 or older.  However, adults younger than 58 years old with certain underlying conditions such as diabetes, heart or lung disease should also receive the vaccine. ?Shingles vaccine to protect against Varicella Zoster if you are older than age 5, or younger than 58 years old with certain underlying illness. ?Hepatitis A vaccine to protect against a form of infection of the liver by a virus acquired from food. ?Hepatitis B vaccine to protect against a form of infection of the liver by a virus acquired from blood or body fluids, particularly if you work in health  care. ?If you plan to travel internationally, check with your local health department for specific vaccination recommendations. ? ?Cancer Screening: ?Most routine colon cancer screening begins at the age of 61. On a

## 2021-05-28 NOTE — Assessment & Plan Note (Signed)
Stable, drink 8 - 10 glasses of water daily, limit sugar intake and eat a low fat diet, exercise 3 - 5 days a week, example walking 1 - 2 miles  ? ?

## 2021-05-29 LAB — COMPREHENSIVE METABOLIC PANEL
ALT: 50 IU/L — ABNORMAL HIGH (ref 0–44)
AST: 42 IU/L — ABNORMAL HIGH (ref 0–40)
Albumin/Globulin Ratio: 1.4 (ref 1.2–2.2)
Albumin: 4.3 g/dL (ref 3.8–4.9)
Alkaline Phosphatase: 125 IU/L — ABNORMAL HIGH (ref 44–121)
BUN/Creatinine Ratio: 14 (ref 9–20)
BUN: 14 mg/dL (ref 6–24)
Bilirubin Total: 0.4 mg/dL (ref 0.0–1.2)
CO2: 24 mmol/L (ref 20–29)
Calcium: 9.4 mg/dL (ref 8.7–10.2)
Chloride: 101 mmol/L (ref 96–106)
Creatinine, Ser: 0.99 mg/dL (ref 0.76–1.27)
Globulin, Total: 3 g/dL (ref 1.5–4.5)
Glucose: 81 mg/dL (ref 70–99)
Potassium: 4.4 mmol/L (ref 3.5–5.2)
Sodium: 140 mmol/L (ref 134–144)
Total Protein: 7.3 g/dL (ref 6.0–8.5)
eGFR: 89 mL/min/{1.73_m2} (ref >59)

## 2021-05-29 LAB — CBC WITH DIFFERENTIAL/PLATELET
Basophils Absolute: 0 10*3/uL (ref 0.0–0.2)
Basos: 0 %
EOS (ABSOLUTE): 0.1 10*3/uL (ref 0.0–0.4)
Eos: 2 %
Hematocrit: 41.1 % (ref 37.5–51.0)
Hemoglobin: 13.9 g/dL (ref 13.0–17.7)
Immature Grans (Abs): 0 10*3/uL (ref 0.0–0.1)
Immature Granulocytes: 1 %
Lymphocytes Absolute: 1.4 10*3/uL (ref 0.7–3.1)
Lymphs: 30 %
MCH: 28.2 pg (ref 26.6–33.0)
MCHC: 33.8 g/dL (ref 31.5–35.7)
MCV: 83 fL (ref 79–97)
Monocytes Absolute: 0.4 10*3/uL (ref 0.1–0.9)
Monocytes: 9 %
Neutrophils Absolute: 2.7 10*3/uL (ref 1.4–7.0)
Neutrophils: 58 %
Platelets: 233 10*3/uL (ref 150–450)
RBC: 4.93 x10E6/uL (ref 4.14–5.80)
RDW: 13.1 % (ref 11.6–15.4)
WBC: 4.6 10*3/uL (ref 3.4–10.8)

## 2021-05-29 LAB — LIPID PANEL
Chol/HDL Ratio: 3.1 ratio (ref 0.0–5.0)
Cholesterol, Total: 168 mg/dL (ref 100–199)
HDL: 55 mg/dL (ref >39)
LDL Chol Calc (NIH): 101 mg/dL — ABNORMAL HIGH (ref 0–99)
Triglycerides: 60 mg/dL (ref 0–149)
VLDL Cholesterol Cal: 12 mg/dL (ref 5–40)

## 2021-05-30 NOTE — Progress Notes (Signed)
Please call patient to let him know -  ? ?Regarding your recent blood work: ? ?Blood counts are fine, no anemia ? ?Kidneys are fine ? ?Liver enzymes are a little elevated  ?No treatment or further evaluation is needed ?We will check the blood test again in 6 - 12 months ?If you take OTC Tylenol and drink alcohol, decrease the amounts of both ?Drink 8 - 10 glasses a day or about 64 ounces a day ?You can add the OTC supplement milk thistle which supports the liver  ? ?Cholesterol levels are fine; continue to eat a low fat diet and avoid fried and processed foods ?

## 2021-11-26 ENCOUNTER — Ambulatory Visit: Payer: 59 | Admitting: Medical

## 2021-11-26 VITALS — BP 122/80 | HR 60 | Wt 210.4 lb

## 2021-11-26 DIAGNOSIS — I10 Essential (primary) hypertension: Secondary | ICD-10-CM | POA: Diagnosis not present

## 2021-11-26 DIAGNOSIS — Z8546 Personal history of malignant neoplasm of prostate: Secondary | ICD-10-CM

## 2021-11-26 DIAGNOSIS — E782 Mixed hyperlipidemia: Secondary | ICD-10-CM

## 2021-11-26 DIAGNOSIS — D472 Monoclonal gammopathy: Secondary | ICD-10-CM

## 2021-11-26 DIAGNOSIS — G4733 Obstructive sleep apnea (adult) (pediatric): Secondary | ICD-10-CM | POA: Diagnosis not present

## 2021-11-26 DIAGNOSIS — E559 Vitamin D deficiency, unspecified: Secondary | ICD-10-CM | POA: Diagnosis not present

## 2021-11-26 DIAGNOSIS — Z131 Encounter for screening for diabetes mellitus: Secondary | ICD-10-CM | POA: Insufficient documentation

## 2021-11-26 DIAGNOSIS — R7989 Other specified abnormal findings of blood chemistry: Secondary | ICD-10-CM

## 2021-11-26 DIAGNOSIS — R748 Abnormal levels of other serum enzymes: Secondary | ICD-10-CM

## 2021-11-26 DIAGNOSIS — G609 Hereditary and idiopathic neuropathy, unspecified: Secondary | ICD-10-CM

## 2021-11-26 DIAGNOSIS — Z8619 Personal history of other infectious and parasitic diseases: Secondary | ICD-10-CM

## 2021-11-26 NOTE — Progress Notes (Signed)
Subjective:  Martin Murphy is a 56 y.o. male who presents for Chief Complaint  Patient presents with   fasting med check    Fasting med check, no concerns. Declines flu shot      [employer] [facility], dermatology Dr. Baird Lyons, pulmonology Dr. Magnus Sinning, Dr. Frankey Shown, Ortho Dr. Curt Bears, oncology Dr. Jolly Mango, GI Dr. Ellison Hughs, urology  Concerns: Med team above reviewed.  He notes that he mainly is seeing urology regularly.  He notes hx/o sarcoidosis in remission  Prostate cancer under control, sees urology regularly  Chart shows hx/o sleep apnea, but his recollection was that study didn't show any significant apnea, not on CPAP.  No particular problems.  Compliant with cholesterol and BP medications.  Had shingles 2020.  No prior Shingrix vaccines.  Declines flu shot  No other aggravating or relieving factors.    No other c/o.  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 Reported on 05/25/2015     atorvastatin (LIPITOR) 20 MG tablet Take 1 tablet (20 mg total) by mouth daily. 90 tablet 1   Cholecalciferol (VITAMIN D3 PO) Take by mouth.     clobetasol (OLUX) 0.05 % topical foam Apply 1 application topically 2 (two) times daily. 100 g 2   hydrochlorothiazide (HYDRODIURIL) 25 MG tablet Take 1 tablet (25 mg total) by mouth daily. 90 tablet 1   Pyridoxine HCl (B-6 PO) Take by mouth.     sildenafil (VIAGRA) 50 MG tablet  Take 50 mg by mouth daily as needed for erectile dysfunction.     tamsulosin (FLOMAX) 0.4 MG CAPS capsule Take 0.4 mg by mouth.     vitamin B-12 (CYANOCOBALAMIN) 1000 MCG tablet Take 1,000 mcg by mouth daily. Reported on 05/25/2015     No current facility-administered medications on file prior to visit.     The following portions of the patient's history were reviewed and updated as appropriate: allergies, current medications, past family history, past medical history, past social history, past surgical history and problem list.  ROS Otherwise as in subjective above  Objective: BP 122/80   Pulse 60   Wt 210 lb 6.4 oz (95.4 kg)   BMI 29.34 kg/m   Wt Readings from Last 3 Encounters:  11/26/21 210 lb 6.4 oz (95.4 kg)  05/28/21 209 lb (94.8 kg)  01/03/21 210 lb (95.3 kg)   BP Readings from Last 3 Encounters:  11/26/21 122/80  05/28/21 128/88  11/13/20 130/70    General appearance: alert, no distress, well developed, well nourished HEENT: normocephalic, sclerae anicteric, conjunctiva pink and moist, TMs pearly, nares patent, no discharge or erythema, pharynx normal Oral cavity: MMM, no lesions Neck: supple, no lymphadenopathy, no thyromegaly, no masses Heart: RRR, normal S1, S2, no murmurs Lungs: CTA bilaterally, no wheezes, rhonchi, or rales Abdomen: +bs, soft, non tender, non distended, no masses, no  hepatomegaly, no splenomegaly Pulses: 2+ radial pulses, 2+ pedal pulses, normal cap refill Ext: no edema   Assessment: Encounter Diagnoses  Name Primary?   Primary hypertension Yes   Obstructive sleep apnea    Mixed hyperlipidemia    Vitamin D deficiency    Elevated alkaline phosphatase level    Elevated LFTs    Hereditary and idiopathic peripheral neuropathy    History of prostate cancer    MGUS (monoclonal gammopathy of unknown significance)    Screening for diabetes mellitus    History of shingles      Plan: I have seen this patient in the past the most recent  was seeing other providers here.  I spent a lot of time today reviewing his chart record and trying to update problem list  High blood pressure-on hydrochlorothiazide 25 mg, at goal  High cholesterol-compliant with Lipitor 20 mg daily.  He is labs in March 2023 look pretty good.  Continue current medication  OSA-he was not aware of his sleep apnea results from November 2022 showing moderate sleep apnea.  We discussed the significance of sleep apnea, symptoms, possible treatment options.  He is going to use bed elevation as he has an adjustable bed.  He does not want to pursue CPAP or other therapies at this time.  Also recommend he work on losing some weight through healthy diet and exercise.  Vitamin D deficiency-not currently on supplement and he has been on supplement in the past.  We discussed that this may possibly be why his alkaline phosphatase has been elevated.  Updated labs today  Elevated alkaline phosphatase likely due to vitamin D deficiency.  We discussed possible causes.  Updated labs today.  He has numbers of been stable for several years for alkaline phosphatase  Elevated liver test-I reviewed gastroenterology notes back in 2017 when he had a consult with GI.  At that time he was negative hepatitis B, 5 nucleotidase level was elevated GGT was elevated, sed rate was slightly elevated at 34, CRP elevated, ferritin was normal, clotting times are normal, alpha-1 antitrypsin was normal, mitochondrial antibody normal, ANA normal, smooth muscle antibody normal, ceruloplasmin elevated at 52, negative hepatitis C. CMET lab today as below.  Chart history shows MGUS, and last hematology note several years ago felt like things are stable and not worrisome but they advised yearly primary care follow-up and surveillance.  History of prostate cancer-sees urology reviewed his urology notes from November 2022.  At that time his note showed prostate ultrasound biopsy in 2014, history of brachytherapy  in 2015.  His PSAs have been stable and currently not on therapy.  He is having surveillance at this time.  I counseled on vaccines.  He declines flu shot.  He had shingles outbreak less than 3 years ago so likely has natural immunity.  He does not want to have a shingles vaccine at this time.  Jerran was seen today for fasting med check.  Diagnoses and all orders for this visit:  Primary hypertension  Obstructive sleep apnea  Mixed hyperlipidemia  Vitamin D deficiency -     VITAMIN D 25 Hydroxy (Vit-D Deficiency, Fractures)  Elevated alkaline phosphatase level -     Comprehensive metabolic panel  Elevated LFTs  Hereditary and idiopathic peripheral neuropathy  History of prostate cancer  MGUS (monoclonal gammopathy of unknown significance) -     Protein electrophoresis, serum  Screening for diabetes mellitus -     Hemoglobin A1c  History of shingles  Follow up: pending labs

## 2021-11-27 ENCOUNTER — Other Ambulatory Visit: Payer: Self-pay | Admitting: Medical

## 2021-11-27 MED ORDER — HYDROCHLOROTHIAZIDE 25 MG PO TABS: 25.0000 mg | ORAL_TABLET | Freq: Every day | ORAL | 3 refills | Status: DC

## 2021-11-27 MED ORDER — ATORVASTATIN CALCIUM 20 MG PO TABS: 20.0000 mg | ORAL_TABLET | Freq: Every day | ORAL | 3 refills | Status: DC

## 2021-11-28 LAB — COMPREHENSIVE METABOLIC PANEL
ALT: 28 IU/L (ref 0–44)
AST: 27 IU/L (ref 0–40)
Albumin/Globulin Ratio: 1.5 (ref 1.2–2.2)
Albumin: 4.2 g/dL (ref 3.8–4.9)
Alkaline Phosphatase: 108 IU/L (ref 44–121)
BUN/Creatinine Ratio: 9 (ref 9–20)
BUN: 10 mg/dL (ref 6–24)
Bilirubin Total: 0.4 mg/dL (ref 0.0–1.2)
CO2: 23 mmol/L (ref 20–29)
Calcium: 9.2 mg/dL (ref 8.7–10.2)
Chloride: 106 mmol/L (ref 96–106)
Creatinine, Ser: 1.06 mg/dL (ref 0.76–1.27)
Globulin, Total: 2.8 g/dL (ref 1.5–4.5)
Glucose: 81 mg/dL (ref 70–99)
Potassium: 4.6 mmol/L (ref 3.5–5.2)
Sodium: 143 mmol/L (ref 134–144)
Total Protein: 7 g/dL (ref 6.0–8.5)
eGFR: 81 mL/min/{1.73_m2} (ref >59)

## 2021-11-28 LAB — PROTEIN ELECTROPHORESIS, SERUM
A/G Ratio: 1.1 (ref 0.7–1.7)
Albumin ELP: 3.6 g/dL (ref 2.9–4.4)
Alpha 1: 0.2 g/dL (ref 0.0–0.4)
Alpha 2: 0.7 g/dL (ref 0.4–1.0)
Beta: 1.5 g/dL — ABNORMAL HIGH (ref 0.7–1.3)
Gamma Globulin: 1 g/dL (ref 0.4–1.8)
Globulin, Total: 3.4 g/dL (ref 2.2–3.9)

## 2021-11-28 LAB — HEMOGLOBIN A1C
Est. average glucose Bld gHb Est-mCnc: 128 mg/dL
Hgb A1c MFr Bld: 6.1 % — ABNORMAL HIGH (ref 4.8–5.6)

## 2021-11-28 LAB — VITAMIN D 25 HYDROXY (VIT D DEFICIENCY, FRACTURES): Vit D, 25-Hydroxy: 30.1 ng/mL (ref 30.0–100.0)

## 2021-12-16 ENCOUNTER — Encounter: Payer: Self-pay | Admitting: Medical

## 2022-03-12 ENCOUNTER — Other Ambulatory Visit: Payer: Self-pay | Admitting: *Deleted

## 2022-03-12 ENCOUNTER — Telehealth: Payer: 59 | Admitting: Family Medicine

## 2022-03-12 ENCOUNTER — Other Ambulatory Visit (INDEPENDENT_AMBULATORY_CARE_PROVIDER_SITE_OTHER): Payer: 59

## 2022-03-12 ENCOUNTER — Encounter: Payer: Self-pay | Admitting: Family Medicine

## 2022-03-12 ENCOUNTER — Encounter: Payer: Self-pay | Admitting: *Deleted

## 2022-03-12 VITALS — Ht 71.0 in | Wt 210.0 lb

## 2022-03-12 DIAGNOSIS — U071 COVID-19: Secondary | ICD-10-CM | POA: Diagnosis not present

## 2022-03-12 DIAGNOSIS — R6883 Chills (without fever): Secondary | ICD-10-CM

## 2022-03-12 DIAGNOSIS — R059 Cough, unspecified: Secondary | ICD-10-CM

## 2022-03-12 LAB — POCT INFLUENZA A/B
Influenza A, POC: NEGATIVE
Influenza B, POC: NEGATIVE

## 2022-03-12 LAB — POC COVID19 BINAXNOW: SARS Coronavirus 2 Ag: POSITIVE — AB

## 2022-03-12 MED ORDER — NIRMATRELVIR/RITONAVIR (PAXLOVID)TABLET: 3.0000 | ORAL_TABLET | Freq: Two times a day (BID) | ORAL | 0 refills | Status: AC

## 2022-03-12 NOTE — Patient Instructions (Addendum)
Your COVID test was positive. As we discussed, we sent in [place]. Take this as directed for 5 days. Stop taking your atorvastatin and don't use sildenafil while on the paxlovid.  Your first day of symptoms was 1/8, which counts as day 0. Isolate days 1 through 5 (1/9 through 1/13). If on 1/14 you no longer have fever and your respiratory symptoms aren't improving (you don't need to be completely better), then you can leave the house (and return to work), but you need to be wearing a mask when around others (you can't eat with other people).  If you are still having fever and severe cough/respiratory symptoms, then you should isolate for longer.  Stay well hydrated.   Use tylenol or ibuprofen as needed for pain, fever, headache. I recommend either adding guaifenesin (plain mucinex), or switching toe Mucinex DM or Robitussin DM (and stopping the Vicks 44 DM which has the same ingredient). Use sinus rinses if you develop sinus pain. Avoid decongestants (ie "sinus" meds, sudafed) as these will raise your blood pressure.  Contact us if you have persistent fever, worsening cough, sinus pain, chest pain, shortness of breath, or other new/concerning symptoms.

## 2022-03-12 NOTE — Progress Notes (Signed)
Start time: 11:17 End time: 11:37  Virtual Visit via Video Note  I connected with Martin Murphy on 03/12/22 by a video enabled telemedicine application and verified that I am speaking with the correct person using two identifiers.  Location: Patient: home Provider: office   I discussed the limitations of evaluation and management by telemedicine and the availability of in person appointments. The patient expressed understanding and agreed to proceed.  History of Present Illness:  Chief Complaint  Patient presents with   Sore Throat    VIRTUAL ST, HA, fever, chills and body aches, as well as cough off and on-symptoms started Monday. Has not taken any home covid tests.    Monday 1/8 he started with some chills.  Yesterday the chills continued, started with some cough, off and on, and left work.  Has been home since. He has felt feverish. He hasn't checked his temperature. He has runny nose (off and on), mostly clear, some yellow and some blood.  He has had some posterior headaches; denies sinus pain. Cough is mostly dry, but has coughed up some yellow phlegm.  Took 2 Advil Monday night, sweated a lot that night. He also took some Vicks Formula 44 cough medication (DM), which has helped.  PMH, PSH, SH reviewed  HTN, HLD Nonsmoker  Hasn't gotten flu shot or COVID boosters, just original 3 vaccines.  Outpatient Encounter Medications as of 03/12/2022  Medication Sig Note   Ascorbic Acid (VITAMIN C PO) Take by mouth daily. Reported on 05/25/2015    atorvastatin (LIPITOR) 20 MG tablet Take 1 tablet (20 mg total) by mouth daily.    Cholecalciferol (VITAMIN D3 PO) Take by mouth.    hydrochlorothiazide (HYDRODIURIL) 25 MG tablet Take 1 tablet (25 mg total) by mouth daily.    ibuprofen (ADVIL) 100 MG/5ML suspension Take 400 mg by mouth every 4 (four) hours as needed. 03/12/2022: Last dose last night before bed   Pyridoxine HCl (B-6 PO) Take by mouth.    tamsulosin (FLOMAX) 0.4 MG CAPS  capsule Take 0.4 mg by mouth.    vitamin B-12 (CYANOCOBALAMIN) 1000 MCG tablet Take 1,000 mcg by mouth daily. Reported on 05/25/2015    sildenafil (VIAGRA) 50 MG tablet Take 50 mg by mouth daily as needed for erectile dysfunction. (Patient not taking: Reported on 03/12/2022) 03/12/2022: prn   [DISCONTINUED] clobetasol (OLUX) 0.05 % topical foam Apply 1 application topically 2 (two) times daily.    No facility-administered encounter medications on file as of 03/12/2022.   No Known Allergies  ROS:  tactile fever/chills and URI symptoms per HPI.  +HA per HPI.  No n/v/d, chest pain, shortness of breath, rash or other concerns. See HPI.    Observations/Objective:  Ht '5\' 11"'$  (1.803 m)   Wt 210 lb (95.3 kg)   BMI 29.29 kg/m   Pleasant male, sounds slightly congested. No coughing or throat-clearing during visit. Speaking comfortably. Exam is limited due to the virtual nature of the visit.  COVID antigen + Influenza A&B negative   Assessment and Plan:  COVID-19 virus infection - Risks/SE of paxlovid reviewed, pt to stop statin and sildenafil. Advised of isolation/masking recs and OTC medications for prn use - Plan: nirmatrelvir/ritonavir (PAXLOVID) 20 x 150 MG & 10 x '100MG'$  TABS   Discussed paxlovid and tamiflu, in case tests are positive. Discussed the potential med interactions with paxlovid, and which to stop. OTC meds were discussed--avoid decongestants.  He was encouraged to find his thermometer so that he can monitor fever.  F/u prn persistent/worsening symptoms.    Follow Up Instructions:    I discussed the assessment and treatment plan with the patient. The patient was provided an opportunity to ask questions and all were answered. The patient agreed with the plan and demonstrated an understanding of the instructions.   The patient was advised to call back or seek an in-person evaluation if the symptoms worsen or if the condition fails to improve as anticipated.  I spent 25  minutes dedicated to the care of this patient, including pre-visit review of records, face to face time, post-visit ordering of testing and documentation.    Vikki Ports, MD

## 2022-03-18 ENCOUNTER — Encounter: Payer: Self-pay | Admitting: Gastroenterology

## 2022-05-30 ENCOUNTER — Encounter: Payer: 59 | Admitting: Medical

## 2022-06-04 ENCOUNTER — Ambulatory Visit (INDEPENDENT_AMBULATORY_CARE_PROVIDER_SITE_OTHER): Payer: 59 | Admitting: Medical

## 2022-06-04 ENCOUNTER — Encounter: Payer: Self-pay | Admitting: Medical

## 2022-06-04 VITALS — BP 124/80 | HR 91 | Ht 71.0 in | Wt 206.4 lb

## 2022-06-04 DIAGNOSIS — Z8619 Personal history of other infectious and parasitic diseases: Secondary | ICD-10-CM

## 2022-06-04 DIAGNOSIS — R7301 Impaired fasting glucose: Secondary | ICD-10-CM | POA: Diagnosis not present

## 2022-06-04 DIAGNOSIS — M25561 Pain in right knee: Secondary | ICD-10-CM | POA: Diagnosis not present

## 2022-06-04 DIAGNOSIS — R109 Unspecified abdominal pain: Secondary | ICD-10-CM | POA: Diagnosis not present

## 2022-06-04 DIAGNOSIS — Z9289 Personal history of other medical treatment: Secondary | ICD-10-CM | POA: Diagnosis not present

## 2022-06-04 DIAGNOSIS — Z1211 Encounter for screening for malignant neoplasm of colon: Secondary | ICD-10-CM | POA: Diagnosis not present

## 2022-06-04 DIAGNOSIS — I10 Essential (primary) hypertension: Secondary | ICD-10-CM | POA: Insufficient documentation

## 2022-06-04 DIAGNOSIS — Z7185 Encounter for immunization safety counseling: Secondary | ICD-10-CM | POA: Insufficient documentation

## 2022-06-04 DIAGNOSIS — Z0000 Encounter for general adult medical examination without abnormal findings: Secondary | ICD-10-CM | POA: Diagnosis not present

## 2022-06-04 DIAGNOSIS — G8929 Other chronic pain: Secondary | ICD-10-CM

## 2022-06-04 DIAGNOSIS — Z136 Encounter for screening for cardiovascular disorders: Secondary | ICD-10-CM | POA: Diagnosis not present

## 2022-06-04 DIAGNOSIS — Z13220 Encounter for screening for lipoid disorders: Secondary | ICD-10-CM | POA: Diagnosis not present

## 2022-06-04 DIAGNOSIS — E785 Hyperlipidemia, unspecified: Secondary | ICD-10-CM

## 2022-06-04 DIAGNOSIS — M25562 Pain in left knee: Secondary | ICD-10-CM

## 2022-06-04 LAB — POCT URINALYSIS DIP (PROADVANTAGE DEVICE)
Bilirubin, UA: NEGATIVE
Blood, UA: NEGATIVE
Glucose, UA: NEGATIVE mg/dL
Ketones, POC UA: NEGATIVE mg/dL
Leukocytes, UA: NEGATIVE
Nitrite, UA: NEGATIVE
Protein Ur, POC: NEGATIVE mg/dL
Specific Gravity, Urine: 1.025
Urobilinogen, Ur: NEGATIVE
pH, UA: 6 (ref 5.0–8.0)

## 2022-06-04 NOTE — Progress Notes (Signed)
Subjective:   HPI  Martin Murphy is a 58 y.o. male who presents for Chief Complaint  Patient presents with   fasting cpe    Fasting cpe, knee pain and right side pain    Patient Care Team: Eowyn Tabone, Camelia Eng, PA-C as PCP - General (Family Medicine) Unice Bailey, MD as Referring Physician (Rheumatology) Dalton-Bethea, Fabio Asa, MD as Referring Physician (Physical Medicine and Rehabilitation) [facility], [facility] Deneise Lever, MD as Consulting Physician (Pulmonary Disease) Dr. Gilford Rile, urology Dr. Havery Moros, GI  Concerns: He notes intermittent pains in his right side along the lower ribs and upper abdomen area.  No pain with eating.  No nausea or vomiting.  No bowel changes.  Pain is very intermittent.  No injury or trauma or bruising.  No swelling  He notes some ongoing problems with his knees.  Every day pain but every now and then pains in either knee particular worse with going up or down stairs.  No knee swelling.    Reviewed their medical, surgical, family, social, medication, and allergy history and updated chart as appropriate.  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 Reported on 05/25/2015, Disp: , Rfl:    atorvastatin (LIPITOR) 20 MG tablet, Take 1 tablet (20 mg total) by mouth daily., Disp: 90 tablet, Rfl: 3   Cholecalciferol (VITAMIN D3 PO), Take by mouth., Disp: , Rfl:    hydrochlorothiazide (HYDRODIURIL) 25 MG tablet, Take 1 tablet (25 mg total) by mouth daily., Disp: 90 tablet, Rfl: 3   ibuprofen (ADVIL) 100 MG/5ML  suspension, Take 400 mg by mouth every 4 (four) hours as needed., Disp: , Rfl:    Pyridoxine HCl (B-6 PO), Take by mouth., Disp: , Rfl:    sildenafil (VIAGRA) 50 MG tablet, Take 50 mg by mouth daily as needed for erectile dysfunction., Disp: , Rfl:    tamsulosin (FLOMAX) 0.4 MG CAPS capsule, Take 0.4 mg by mouth., Disp: , Rfl:    vitamin B-12 (CYANOCOBALAMIN) 1000 MCG tablet, Take 1,000 mcg by mouth daily. Reported on 05/25/2015, Disp: , Rfl:   Family History  Problem Relation Age of Onset   Hypertension Brother    Diabetes Brother    Cancer Maternal Grandfather        prostate   Prostate cancer Maternal Uncle        1 had surgery, 1 had seeds   Colon cancer Neg Hx    Esophageal cancer Neg Hx    Stomach cancer Neg Hx    Rectal cancer Neg Hx    Hyperlipidemia Neg Hx    CVA Neg Hx    Heart attack Neg Hx     Past Surgical History:  Procedure Laterality Date   BRONCHOSCOPY  1990's   w/  biopsy's   BUNIONECTOMY     FOOT SURGERY     3 surgeries total   INGUINAL HERNIA REPAIR Bilateral right  02-09-2012/   left  1990's   PROSTATE BIOPSY  12/20/12   gleason  6   PROSTATE BIOPSY  06/07/13   gleason 6, vol 36 gms   RADIOACTIVE SEED IMPLANT N/A 10/20/2013   Procedure: RADIOACTIVE SEED IMPLANT;  Surgeon: Bernestine Amass, MD;  Location: [facility];  Service: Urology;  Laterality: N/A;    Review of Systems  Constitutional:  Negative for chills, fever, malaise/fatigue and weight loss.  HENT:  Negative for congestion, ear pain, hearing loss, sore throat and tinnitus.   Eyes:  Negative for blurred vision, pain and redness.  Respiratory:  Negative for cough, hemoptysis and shortness of breath.   Cardiovascular:  Negative for chest pain, palpitations, orthopnea, claudication and leg swelling.  Gastrointestinal:  Positive for abdominal pain. Negative for blood in stool, constipation, diarrhea, nausea and vomiting.  Genitourinary:  Negative for dysuria, flank pain, frequency,  hematuria and urgency.  Musculoskeletal:  Positive for joint pain. Negative for falls and myalgias.  Skin:  Negative for itching and rash.  Neurological:  Negative for dizziness, tingling, speech change, weakness and headaches.  Endo/Heme/Allergies:  Negative for polydipsia. Does not bruise/bleed easily.  Psychiatric/Behavioral:  Negative for depression and memory loss. The patient is not nervous/anxious and does not have insomnia.        Objective:  BP 124/80   Pulse 91   Ht 5\' 11"  (1.803 m)   Wt 206 lb 6.4 oz (93.6 kg)   BMI 28.79 kg/m   General appearance: alert, no distress, WD/WN, African American male Skin: unremarkable HEENT: normocephalic, conjunctiva/corneas normal, sclerae anicteric, PERRLA, EOMi, nares patent, no discharge or erythema, pharynx normal Oral cavity: MMM, tongue normal, teeth in good repair Neck: supple, no lymphadenopathy, no thyromegaly, no masses, normal ROM, no bruits Chest: non tender, normal shape and expansion Heart: RRR, normal S1, S2, no murmurs Lungs: CTA bilaterally, no wheezes, rhonchi, or rales Abdomen: +bs, soft, mild tendnerss in right side, lower rib/RUQ, but a little unclear, otherwise non tender, non distended, no masses, no hepatomegaly, no splenomegaly, no bruits Back: non tender, normal ROM, no scoliosis Musculoskeletal: A little laxity with valgus testing of the left knee otherwise no obvious deformity or swelling of either knee, range of motion, upper extremities non tender, no obvious deformity, normal ROM throughout, lower extremities non tender, no obvious deformity, normal ROM throughout Extremities: no edema, no cyanosis, no clubbing Pulses: 2+ symmetric, upper and lower extremities, normal cap refill Neurological: alert, oriented x 3, CN2-12 intact, strength normal upper extremities and lower extremities, sensation normal throughout, DTRs 2+ throughout, no cerebellar signs, gait normal Psychiatric: normal affect, behavior normal,  pleasant  GU/rectal -deferred to urology   EKG reviewed    Assessment and Plan :   Encounter Diagnoses  Name Primary?   Encounter for health maintenance examination in adult Yes   Impaired fasting blood sugar    Screening for lipid disorders    Screen for colon cancer    Chronic pain of both knees    Right sided abdominal pain    Screening for heart disease    Vaccine counseling    H/O sleep study     This visit was a preventative care visit, also known as wellness visit or routine physical.   Topics typically include healthy lifestyle, diet, exercise, preventative care, vaccinations, sick and well care, proper use of emergency dept and after hours care, as well as other concerns.     Separate significant issues discussed: Hypertension-continue current medication  Hyperlipidemia-continue current medication, labs today  Knee pain bilaterally-likely some osteoarthritis versus chondromalacia or other.  Offered x-rays baseline.  He will consider  ED - does fine on Viagra  Hx/o prostate cancer - managed by urology  History of sleep study - reviewed back over 2022 study , abnormal but he thinks he took the instrument off in the night and thinks results are not accurate  Side/abdominal pain - unclear etiology.  Labs today. Consider imaging such as CT or US abdomen    General Recommendations: Continue to return yearly for your annual wellness and preventative care visits.  This gives [place] a chance to discuss healthy lifestyle, exercise, vaccinations, review your chart record, and perform screenings where appropriate.  I recommend you see your eye doctor yearly for routine vision care.  I recommend you see your dentist yearly for routine dental care including hygiene visits twice yearly.   Vaccination  Immunization History  Administered Date(s) Administered   PFIZER(Purple Top)SARS-COV-2 Vaccination 05/12/2019, 06/02/2019, 03/10/2020   Tdap 10/27/2014   Declines  Shingrix He notes having shingles 2 years ago  Screening for cancer: Colon cancer screening: We will refer you for updated screening colonoscopy   History of prostate cancer - continue routine follow up urology   Skin cancer screening: Check your skin regularly for new changes, growing lesions, or other lesions of concern Come in for evaluation if you have skin lesions of concern.   Lung cancer screening: If you have a greater than 20 pack year history of tobacco use, then you may qualify for lung cancer screening with a chest CT scan.   Please call your insurance company to inquire about coverage for this test.   Pancreatic cancer:  no current screening test is available or routinely recommended. 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  Symptoms: jaundice, dark urine, light color or greasy stools, itchy skin, belly or back pain, weight loss, poor appetite, nause, vomiting, liver enlargement, DVT/blood clots.   We currently don't have screenings for other cancers besides breast, cervical, colon, and lung cancers.  If you have a strong family history of cancer or have other cancer screening concerns, please let me know.  Genetic testing referral is an option for individuals with high cancer risk in the family.  There are some other cancer screenings in development currently.   Bone health: Get at least 150 minutes of aerobic exercise weekly Get weight bearing exercise at least once weekly Bone density test:  A bone density test is an imaging test that uses a type of X-ray to measure the amount of calcium and other minerals in your bones. The test may be used to diagnose or screen you for a condition that causes weak or thin bones (osteoporosis), predict your risk for a broken bone (fracture), or  determine how well your osteoporosis treatment is working. The bone density test is recommended for females 72 and older, or females or males [id number] if certain risk factors such as thyroid disease, long term use of steroids such as for asthma or rheumatological issues, vitamin D deficiency, estrogen deficiency, family history of osteoporosis, self or family history of fragility fracture in first degree relative.    Heart health: Get at least 150 minutes of aerobic exercise weekly Limit alcohol It is important to maintain a healthy blood pressure and healthy cholesterol numbers  Heart disease screening: Screening for heart disease includes screening for blood pressure, fasting lipids, glucose/diabetes screening, BMI height to weight ratio,  reviewed of smoking status, physical activity, and diet.    Goals include blood pressure 120/80 or less, maintaining a healthy lipid/cholesterol profile, preventing diabetes or keeping diabetes numbers under good control, not smoking or using tobacco products, exercising most days per week or at least 150 minutes per week of exercise, and eating healthy variety of fruits and vegetables, healthy oils, and avoiding unhealthy food choices like fried food, fast food, high sugar and high cholesterol foods.    Other tests may possibly include EKG test, CT coronary calcium score, echocardiogram, exercise treadmill stress test.      Vascular disease screening: For higher risk individuals including smokers, diabetics, patients with known heart disease or high blood pressure, kidney disease, and others, screening for vascular disease or atherosclerosis of the arteries is available.  Examples may include carotid ultrasound, abdominal aortic ultrasound, ABI blood flow screening in the legs, thoracic aorta screening.   Medical care options: I recommend you continue to seek care here first for routine care.  We try really hard to have available appointments Monday through  Friday daytime hours for sick visits, acute visits, and physicals.  Urgent care should be used for after hours and weekends for significant issues that cannot wait till the next day.  The emergency department should be used for significant potentially life-threatening emergencies.  The emergency department is expensive, can often have long wait times for less significant concerns, so try to utilize primary care, urgent care, or telemedicine when possible to avoid unnecessary trips to the emergency department.  Virtual visits and telemedicine have been introduced since the pandemic started in 2020, and can be convenient ways to receive medical care.  We offer virtual appointments as well to assist you in a variety of options to seek medical care.   Legal  Take the time to do a last will and testament, Advanced Directives including [place] and Living Will documents.  Don't leave your family with burdens that can be handled ahead of time.   Advanced Directives: I recommend you consider completing a [place] and Living Will.   These documents respect your wishes and help alleviate burdens on your loved ones if you were to become terminally ill or be in a position to need those documents enforced.    You can complete Advanced Directives yourself, have them notarized, then have copies made for our office, for you and for anybody you feel should have them in safe keeping.  Or, you can have an attorney prepare these documents.   If you haven't updated your Last Will and Testament in a while, it may be worthwhile having an attorney prepare these documents together and save on some costs.       Spiritual and Emotional Health Keeping a healthy spiritual life can help you better manage your physical health. Your spiritual life can help you to cope with any issues that may arise with your physical health.  Balance can keep [place] healthy and help [place] to recover.  If you are  struggling with your spiritual health there are questions that you may want to ask yourself:  What makes me feel most complete? When do I feel most connected to the rest of the world? Where do I find the most inner strength? What am I doing when I feel whole?  Helpful tips: Being in nature. Some people feel very connected and at peace when they are walking outdoors or are outside. Helping others. Some feel  the largest sense of wellbeing when they are of service to others. Being of service can take on many forms. It can be doing volunteer work, being kind to strangers, or offering a hand to a friend in need. Gratitude. Some people find they feel the most connected when they remain grateful. They may make lists of all the things they are grateful for or say a thank you out loud for all they have.    Emotional Health Are you in tune with your emotional health?  Check out this link: http://www.bray.com/    Financial Health Make sure you use a budget for your personal finances Make sure you are insured against risks (health insurance, life insurance, auto insurance, etc) Save more, spend less Set financial goals If you need help in this area, good resources include counseling through [employer] or other community resources, have a meeting with a Emergency planning/management officer, and a good resource is the [employer]    Martin Murphy was seen today for fasting cpe.  Diagnoses and all orders for this visit:  Encounter for health maintenance examination in adult -     Hemoglobin A1c -     Comprehensive metabolic panel -     CBC with Differential/Platelet -     POCT Urinalysis DIP (Proadvantage Device) -     Lipid panel -     Ambulatory referral to Gastroenterology -     EKG 12-Lead  Impaired fasting blood sugar -     Hemoglobin A1c  Screening for lipid disorders -     Lipid panel  Screen for colon cancer -     Ambulatory referral to Gastroenterology  Chronic  pain of both knees  Right sided abdominal pain  Screening for heart disease -     EKG 12-Lead  Vaccine counseling  H/O sleep study     Follow-up pending labs, yearly for physical

## 2022-06-05 LAB — COMPREHENSIVE METABOLIC PANEL
ALT: 38 IU/L (ref 0–44)
AST: 33 IU/L (ref 0–40)
Albumin/Globulin Ratio: 1.5 (ref 1.2–2.2)
Albumin: 4.3 g/dL (ref 3.8–4.9)
Alkaline Phosphatase: 113 IU/L (ref 44–121)
BUN/Creatinine Ratio: 10 (ref 9–20)
BUN: 10 mg/dL (ref 6–24)
Bilirubin Total: 0.4 mg/dL (ref 0.0–1.2)
CO2: 23 mmol/L (ref 20–29)
Calcium: 9.4 mg/dL (ref 8.7–10.2)
Chloride: 101 mmol/L (ref 96–106)
Creatinine, Ser: 1.01 mg/dL (ref 0.76–1.27)
Globulin, Total: 2.9 g/dL (ref 1.5–4.5)
Glucose: 90 mg/dL (ref 70–99)
Potassium: 3.9 mmol/L (ref 3.5–5.2)
Sodium: 140 mmol/L (ref 134–144)
Total Protein: 7.2 g/dL (ref 6.0–8.5)
eGFR: 86 mL/min/{1.73_m2} (ref >59)

## 2022-06-05 LAB — CBC WITH DIFFERENTIAL/PLATELET
Basophils Absolute: 0 10*3/uL (ref 0.0–0.2)
Basos: 0 %
EOS (ABSOLUTE): 0.1 10*3/uL (ref 0.0–0.4)
Eos: 3 %
Hematocrit: 40.5 % (ref 37.5–51.0)
Hemoglobin: 13.2 g/dL (ref 13.0–17.7)
Immature Grans (Abs): 0 10*3/uL (ref 0.0–0.1)
Immature Granulocytes: 0 %
Lymphocytes Absolute: 1.5 10*3/uL (ref 0.7–3.1)
Lymphs: 34 %
MCH: 27.8 pg (ref 26.6–33.0)
MCHC: 32.6 g/dL (ref 31.5–35.7)
MCV: 85 fL (ref 79–97)
Monocytes Absolute: 0.4 10*3/uL (ref 0.1–0.9)
Monocytes: 8 %
Neutrophils Absolute: 2.4 10*3/uL (ref 1.4–7.0)
Neutrophils: 55 %
Platelets: 204 10*3/uL (ref 150–450)
RBC: 4.74 x10E6/uL (ref 4.14–5.80)
RDW: 13.3 % (ref 11.6–15.4)
WBC: 4.3 10*3/uL (ref 3.4–10.8)

## 2022-06-05 LAB — LIPID PANEL
Chol/HDL Ratio: 3.1 ratio (ref 0.0–5.0)
Cholesterol, Total: 165 mg/dL (ref 100–199)
HDL: 53 mg/dL (ref >39)
LDL Chol Calc (NIH): 99 mg/dL (ref 0–99)
Triglycerides: 68 mg/dL (ref 0–149)
VLDL Cholesterol Cal: 13 mg/dL (ref 5–40)

## 2022-06-05 LAB — HEMOGLOBIN A1C
Est. average glucose Bld gHb Est-mCnc: 131 mg/dL
Hgb A1c MFr Bld: 6.2 % — ABNORMAL HIGH (ref 4.8–5.6)

## 2022-06-05 NOTE — Progress Notes (Signed)
Results sent through MyChart

## 2022-07-18 ENCOUNTER — Ambulatory Visit (AMBULATORY_SURGERY_CENTER): Payer: 59

## 2022-07-18 VITALS — Ht 71.0 in | Wt 210.0 lb

## 2022-07-18 DIAGNOSIS — Z86010 Personal history of colonic polyps: Secondary | ICD-10-CM

## 2022-07-18 MED ORDER — NA SULFATE-K SULFATE-MG SULF 17.5-3.13-1.6 GM/177ML PO SOLN: 1.0000 | Freq: Once | ORAL | 0 refills | Status: AC

## 2022-07-18 NOTE — Progress Notes (Signed)
No egg or soy allergy known to patient  No issues known to pt with past sedation with any surgeries or procedures Patient denies ever being told they had issues or difficulty with intubation  No FH of Malignant Hyperthermia Pt is not on diet pills Pt is not on  home 02  Pt is not on blood thinners  Pt denies issues with constipation  No A fib or A flutter Have any cardiac testing pending--no  Pt is ambulatory  Patient's chart reviewed by Cathlyn Parsons CNRA prior to previsit and patient appropriate for the [facility].  Previsit completed and red dot placed by patient's name on their procedure day (on provider's schedule).     PV completed with patient. Prep reviewed. Instructions sent to [facility] and to address on file. Rx sent to preferred pharmacy. Goodrx coupon for [facility] provided. Pt instructed to use Singlecare.com or GoodRx for a price reduction on prep

## 2022-07-21 ENCOUNTER — Encounter: Payer: Self-pay | Admitting: Gastroenterology

## 2022-07-28 ENCOUNTER — Encounter: Payer: Self-pay | Admitting: Certified Registered Nurse Anesthetist

## 2022-08-04 ENCOUNTER — Ambulatory Visit (AMBULATORY_SURGERY_CENTER): Payer: 59 | Admitting: Gastroenterology

## 2022-08-04 ENCOUNTER — Encounter: Payer: Self-pay | Admitting: Gastroenterology

## 2022-08-04 VITALS — BP 131/86 | HR 68 | Temp 98.0°F | Resp 13 | Ht 71.0 in | Wt 210.0 lb

## 2022-08-04 DIAGNOSIS — Z86010 Personal history of colonic polyps: Secondary | ICD-10-CM

## 2022-08-04 DIAGNOSIS — D122 Benign neoplasm of ascending colon: Secondary | ICD-10-CM | POA: Diagnosis not present

## 2022-08-04 DIAGNOSIS — Z09 Encounter for follow-up examination after completed treatment for conditions other than malignant neoplasm: Secondary | ICD-10-CM

## 2022-08-04 DIAGNOSIS — K635 Polyp of colon: Secondary | ICD-10-CM

## 2022-08-04 MED ORDER — SODIUM CHLORIDE 0.9 % IV SOLN
500.0000 mL | Freq: Once | INTRAVENOUS | Status: DC
Start: 2022-08-04 — End: 2022-08-04

## 2022-08-04 NOTE — Patient Instructions (Signed)
YOU HAD AN ENDOSCOPIC PROCEDURE TODAY AT THE [facility]:   Refer to the procedure report that was given to you for any specific questions about what was found during the examination.  If the procedure report does not answer your questions, please call your gastroenterologist to clarify.  If you requested that your care partner not be given the details of your procedure findings, then the procedure report has been included in a sealed envelope for you to review at your convenience later.  YOU SHOULD EXPECT: Some feelings of bloating in the abdomen. Passage of more gas than usual.  Walking can help get rid of the air that was put into your GI tract during the procedure and reduce the bloating. If you had a lower endoscopy (such as a colonoscopy or flexible sigmoidoscopy) you may notice spotting of blood in your stool or on the toilet paper. If you underwent a bowel prep for your procedure, you may not have a normal bowel movement for a few days.  Please Note:  You might notice some irritation and congestion in your nose or some drainage.  This is from the oxygen used during your procedure.  There is no need for concern and it should clear up in a day or so.  SYMPTOMS TO REPORT IMMEDIATELY:  Following lower endoscopy (colonoscopy or flexible sigmoidoscopy):  Excessive amounts of blood in the stool  Significant tenderness or worsening of abdominal pains  Swelling of the abdomen that is new, acute  Fever of 100F or higher  For urgent or emergent issues, a gastroenterologist can be reached at any hour by calling [phone]. Do not use MyChart messaging for urgent concerns.    DIET:  We do recommend a small meal at first, but then you may proceed to your regular diet.  Drink plenty of fluids but you should avoid alcoholic beverages for 24 hours.  ACTIVITY:  You should plan to take it easy for the rest of today and you should NOT DRIVE or use heavy machinery until tomorrow (because of  the sedation medicines used during the test).    FOLLOW UP: Our staff will call the number listed on your records the next business day following your procedure.  We will call around 7:15- 8:00 am to check on you and address any questions or concerns that you may have regarding the information given to you following your procedure. If we do not reach you, we will leave a message.     If any biopsies were taken you will be contacted by phone or by letter within the next 1-3 weeks.  Please call us at [phone] if you have not heard about the biopsies in 3 weeks.    SIGNATURES/CONFIDENTIALITY: You and/or your care partner have signed paperwork which will be entered into your electronic medical record.  These signatures attest to the fact that that the information above on your After Visit Summary has been reviewed and is understood.  Full responsibility of the confidentiality of this discharge information lies with you and/or your care-partner.  

## 2022-08-04 NOTE — Progress Notes (Signed)
Report given to PACU, vss 

## 2022-08-04 NOTE — Op Note (Signed)
[facility] Patient Name: Martin Murphy Procedure Date: 08/04/2022 8:38 AM MRN: [id number] Endoscopist: Viviann Spare P. Adela Lank , MD, [id number] Age: 57 Referring MD:  Date of Birth: 1965/11/16 Gender: Male Account #: [id number] Procedure:                Colonoscopy Indications:              High risk colon cancer surveillance: Personal                            history of colonic polyps - adenoma removed 12/2014 Medicines:                Monitored Anesthesia Care Procedure:                Pre-Anesthesia Assessment:                           - Prior to the procedure, a History and Physical                            was performed, and patient medications and                            allergies were reviewed. The patient's tolerance of                            previous anesthesia was also reviewed. The risks                            and benefits of the procedure and the sedation                            options and risks were discussed with the patient.                            All questions were answered, and informed consent                            was obtained. Prior Anticoagulants: The patient has                            taken no anticoagulant or antiplatelet agents. ASA                            Grade Assessment: III - A patient with severe                            systemic disease. After reviewing the risks and                            benefits, the patient was deemed in satisfactory                            condition to undergo the procedure.  After obtaining informed consent, the colonoscope                            was passed under direct vision. Throughout the                            procedure, the patient's blood pressure, pulse, and                            oxygen saturations were monitored continuously. The                            CF [id number] #[id number] was introduced through the anus                            and  advanced to the the cecum, identified by                            appendiceal orifice and ileocecal valve. The                            colonoscopy was performed without difficulty. The                            patient tolerated the procedure well. The quality                            of the bowel preparation was good. The ileocecal                            valve, appendiceal orifice, and rectum were                            photographed. Scope In: 8:42:59 AM Scope Out: 8:55:14 AM Scope Withdrawal Time: 0 hours 10 minutes 48 seconds  Total Procedure Duration: 0 hours 12 minutes 15 seconds  Findings:                 The perianal and digital rectal examinations were                            normal.                           A 3 to 4 mm polyp was found in the ascending colon.                            The polyp was flat. The polyp was removed with a                            cold snare. Resection and retrieval were complete.                           A few small-mouthed diverticula were found in the  sigmoid colon.                           Internal hemorrhoids were found during retroflexion.                           The exam was otherwise without abnormality. Complications:            No immediate complications. Estimated blood loss:                            Minimal. Estimated Blood Loss:     Estimated blood loss was minimal. Impression:               - One 3 to 4 mm polyp in the ascending colon,                            removed with a cold snare. Resected and retrieved.                           - Diverticulosis in the sigmoid colon.                           - Internal hemorrhoids.                           - The examination was otherwise normal.                           - The GI Genius (intelligent endoscopy module),                            computer-aided polyp detection system powered by AI                            was utilized to  detect colorectal polyps through                            enhanced visualization during colonoscopy. Recommendation:           - Patient has a contact number available for                            emergencies. The signs and symptoms of potential                            delayed complications were discussed with the                            patient. Return to normal activities tomorrow.                            Written discharge instructions were provided to the                            patient.                           -  Resume previous diet.                           - Continue present medications.                           - Await pathology results. Viviann Spare P. Davan Nawabi, MD 08/04/2022 9:00:01 AM This report has been signed electronically.

## 2022-08-04 NOTE — Progress Notes (Signed)
Pt's states no medical or surgical changes since previsit or office visit. 

## 2022-08-04 NOTE — Progress Notes (Signed)
[facility] History and Physical   Primary Care Physician:  Jac Canavan, PA-C   Reason for Procedure:   History of adenoma  Plan:    colonoscopy     HPI: Martin Murphy is a 57 y.o. male  here for colonoscopy surveillance - adenoma 12/2014.   Patient denies any bowel symptoms at this time. No family history of colon cancer known. Otherwise feels well without any cardiopulmonary symptoms.   I have discussed risks / benefits of anesthesia and endoscopic procedure with Therese Sarah and they wish to proceed with the exams as outlined today.    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 Authorizing Provider  atorvastatin (LIPITOR) 20 MG tablet Take 1 tablet (20 mg total) by mouth daily. 11/27/21  Yes Tysinger, Kermit Balo, PA-C  Cholecalciferol (VITAMIN D3 PO) Take by mouth.   Yes [provider]  hydrochlorothiazide (HYDRODIURIL) 25 MG tablet Take 1 tablet (25 mg total) by mouth daily. 11/27/21  Yes Tysinger, Kermit Balo, PA-C  Pyridoxine HCl (B-6 PO) Take by mouth.   Yes  [provider]  Ascorbic Acid (VITAMIN C PO) Take by mouth daily. Reported on 05/25/2015    [provider]  ibuprofen (ADVIL) 100 MG/5ML suspension Take 400 mg by mouth every 4 (four) hours as needed.    [provider]  sildenafil (VIAGRA) 50 MG tablet Take 50 mg by mouth daily as needed for erectile dysfunction.    [provider]  tamsulosin (FLOMAX) 0.4 MG CAPS capsule Take 0.4 mg by mouth.    [provider]  vitamin B-12 (CYANOCOBALAMIN) 1000 MCG tablet Take 1,000 mcg by mouth daily. Reported on 05/25/2015    [provider]    Current Outpatient Medications  Medication Sig Dispense Refill   atorvastatin (LIPITOR) 20 MG tablet Take 1 tablet (20 mg total) by mouth daily. 90 tablet 3   Cholecalciferol (VITAMIN D3 PO) Take by mouth.     hydrochlorothiazide (HYDRODIURIL) 25 MG tablet Take 1 tablet (25 mg total) by mouth daily. 90 tablet 3   Pyridoxine HCl (B-6 PO) Take by mouth.     Ascorbic Acid (VITAMIN C PO) Take by mouth daily. Reported on 05/25/2015     ibuprofen (ADVIL) 100 MG/5ML suspension Take 400 mg by mouth every 4 (four) hours as needed.     sildenafil (VIAGRA) 50 MG tablet Take 50 mg by mouth daily  as needed for erectile dysfunction.     tamsulosin (FLOMAX) 0.4 MG CAPS capsule Take 0.4 mg by mouth.     vitamin B-12 (CYANOCOBALAMIN) 1000 MCG tablet Take 1,000 mcg by mouth daily. Reported on 05/25/2015     Current Facility-Administered Medications  Medication Dose Route Frequency Provider Last Rate Last Admin   0.9 %  sodium chloride infusion  500 mL Intravenous Once Axelle Szwed, Willaim Rayas, MD        Allergies as of 08/04/2022   (No Known Allergies)    Family History  Problem Relation Age of Onset   Hypertension Brother    Diabetes Brother    Cancer Maternal Grandfather        prostate   Prostate cancer Maternal Uncle        1 had surgery, 1 had seeds   Colon cancer Neg Hx    Esophageal cancer Neg Hx    Stomach  cancer Neg Hx    Rectal cancer Neg Hx    Hyperlipidemia Neg Hx    CVA Neg Hx    Heart attack Neg Hx     Social History   Socioeconomic History   Marital status: Married    Spouse name: Not on file   Number of children: Not on file   Years of education: Not on file   Highest education level: Not on file  Occupational History   Not on file  Tobacco Use   Smoking status: Never   Smokeless tobacco: Never  Vaping Use   Vaping Use: Never used  Substance and Sexual Activity   Alcohol use: Not Currently    Comment: rarely on vacation   Drug use: No   Sexual activity: Yes  Other Topics Concern   Not on file  Social History Narrative   Lives with wife and children.  Children 15yo and 13yo.   Works as Pensions consultant at [employer].  Exercise - walking.   06/2022.     Social Determinants of Health   Financial Resource Strain: Not on file  Food Insecurity: Not on file  Transportation Needs: Not on file  Physical Activity: Not on file  Stress: Not on file  Social Connections: Not on file  Intimate Partner Violence: Not on file    Review of Systems: All other review of systems negative except as mentioned in the HPI.  Physical Exam: Vital signs BP 135/88   Pulse 72   Temp 98 F (36.7 C)   Ht 5\' 11"  (1.803 m)   Wt 210 lb (95.3 kg)   SpO2 97%   BMI 29.29 kg/m   General:   Alert,  Well-developed, pleasant and cooperative in NAD Lungs:  Clear throughout to auscultation.   Heart:  Regular rate and rhythm Abdomen:  Soft, nontender and nondistended.   Neuro/Psych:  Alert and cooperative. Normal mood and affect. A and O x 3  Harlin Rain, MD [facility]

## 2022-08-04 NOTE — Progress Notes (Signed)
Called to room to assist during endoscopic procedure.  Patient ID and intended procedure confirmed with present staff. Received instructions for my participation in the procedure from the performing physician.  

## 2022-08-05 ENCOUNTER — Telehealth: Payer: Self-pay | Admitting: *Deleted

## 2022-08-05 NOTE — Telephone Encounter (Signed)
  Follow up Call-     08/04/2022    8:19 AM  Call back number  Post procedure Call Back phone  # [phone]  Permission to leave phone message Yes     Patient questions:  Do you have a fever, pain , or abdominal swelling? No. Pain Score  0 *  Have you tolerated food without any problems? Yes.    Have you been able to return to your normal activities? Yes.    Do you have any questions about your discharge instructions: Diet   No. Medications  No. Follow up visit  No.  Do you have questions or concerns about your Care? No.  Actions: * If pain score is 4 or above: No action needed, pain <4.

## 2022-08-13 ENCOUNTER — Encounter: Payer: Self-pay | Admitting: Gastroenterology

## 2022-09-09 ENCOUNTER — Telehealth (INDEPENDENT_AMBULATORY_CARE_PROVIDER_SITE_OTHER): Payer: 59 | Admitting: Medical

## 2022-09-09 ENCOUNTER — Encounter: Payer: Self-pay | Admitting: Medical

## 2022-09-09 VITALS — Ht 71.0 in | Wt 211.0 lb

## 2022-09-09 DIAGNOSIS — J3489 Other specified disorders of nose and nasal sinuses: Secondary | ICD-10-CM

## 2022-09-09 DIAGNOSIS — J988 Other specified respiratory disorders: Secondary | ICD-10-CM

## 2022-09-09 DIAGNOSIS — R058 Other specified cough: Secondary | ICD-10-CM

## 2022-09-09 MED ORDER — HYDROCODONE BIT-HOMATROP MBR 5-1.5 MG/5ML PO SOLN: 5.0000 mL | Freq: Three times a day (TID) | ORAL | 0 refills | Status: AC | PRN

## 2022-09-09 MED ORDER — AMOXICILLIN 875 MG PO TABS: 875.0000 mg | ORAL_TABLET | Freq: Two times a day (BID) | ORAL | 0 refills | Status: AC

## 2022-09-09 NOTE — Progress Notes (Signed)
Subjective:     Patient ID: Martin Murphy, male   DOB: 12-05-1965, 57 y.o.   MRN: [id number]  This visit type was conducted due to national recommendations for restrictions regarding the COVID-19 Pandemic (e.g. social distancing) in an effort to limit this patient's exposure and mitigate transmission in our community.  Due to their co-morbid illnesses, this patient is at least at moderate risk for complications without adequate follow up.  This format is felt to be most appropriate for this patient at this time.    Documentation for virtual audio and video telecommunications through [place] encounter:  The patient was located at home. The provider was located in the office. The patient did consent to this visit and is aware of possible charges through their insurance for this visit.  The other persons participating in this telemedicine service were none. Time spent on call was 20 minutes and in review of previous records 20 minutes total.  This virtual service is not related to other E/M service within previous 7 days.   HPI Chief Complaint  Patient presents with   Cough    VIRTUAL coughing up yellow/green mucus. Had a slight ST. Symptoms started Friday night. Had fever and chills. No home covid tests.    Virtual consult for illness.  Started getting congestion, fever, and sore throat 4 days ago.  Been using advil, vicks vapor cool cough drop, feeling some better, but coughing up some yellow mucous.   Voice is coming and going.   Had subjective fever over weekend but that has improved.   No wheezing.  No nausea or vomiting, no diarrhea.  Lots of head congestion, has some sinus pain between eyes. no blood tinged mucous.  He is getting more consistent darker mucus production from cough or out the nose.  Currently thought to be cancer free.  Not currently on any cancer therapies  No other aggravating or relieving factors. No other complaint.    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 Reported on 05/25/2015     atorvastatin (LIPITOR) 20 MG tablet Take 1 tablet (20 mg total) by mouth daily. 90 tablet 3   Cholecalciferol (VITAMIN D3 PO) Take by mouth.     hydrochlorothiazide (HYDRODIURIL) 25 MG tablet Take 1 tablet (25 mg total) by mouth daily. 90 tablet 3   ibuprofen (ADVIL) 200 MG tablet Take 400 mg by mouth every 6 (six) hours as needed.     Phenylephrine-DM-GG-APAP (DAYQUIL SEVERE + VAPOCOOL PO) Take 1 capsule by mouth as needed.     Pyridoxine HCl (B-6 PO) Take by mouth.     tamsulosin (FLOMAX) 0.4 MG CAPS capsule Take 0.4 mg by mouth.     vitamin B-12 (CYANOCOBALAMIN) 1000 MCG tablet Take 1,000 mcg by mouth daily. Reported on 05/25/2015     sildenafil (VIAGRA) 50 MG tablet Take 50 mg by mouth daily as needed for erectile dysfunction. (Patient not taking: Reported on 09/09/2022)  No current facility-administered medications on file prior to visit.     Review of Systems As in subjective    Objective:   Physical Exam Due to coronavirus pandemic stay at home measures, patient visit was virtual and they were not examined in person.   Ht 5\' 11"  (1.803 m)   Wt 211 lb (95.7 kg)   BMI 29.43 kg/m   Gen: wd, wn,nad No labored breathing or wheezing     Assessment:     Encounter Diagnoses  Name Primary?   Respiratory tract infection Yes   Sinus pressure    Productive cough        Plan:     Advised rest, good hydration, begin Mucinex DM over-the-counter for the next several days.  Can  use the Hycodan at night for worse cough keeping him up at night, caution on sedation.  Begin amoxicillin antibiotic.  If not improving in the next 3-4 days or if getting worse or new symptoms then get reevaluated.  Discussed limitations of virtual consult  Toru was seen today for cough.  Diagnoses and all orders for this visit:  Respiratory tract infection  Sinus pressure  Productive cough  Other orders -     HYDROcodone bit-homatropine (HYCODAN) 5-1.5 MG/5ML syrup; Take 5 mLs by mouth every 8 (eight) hours as needed for up to 5 days for cough. -     amoxicillin (AMOXIL) 875 MG tablet; Take 1 tablet (875 mg total) by mouth 2 (two) times daily for 10 days.  F/u prn

## 2022-11-06 ENCOUNTER — Ambulatory Visit: Payer: 59 | Admitting: Medical

## 2022-11-06 VITALS — BP 120/80 | HR 63 | Temp 97.3°F | Wt 205.8 lb

## 2022-11-06 DIAGNOSIS — K5900 Constipation, unspecified: Secondary | ICD-10-CM

## 2022-11-06 DIAGNOSIS — R140 Abdominal distension (gaseous): Secondary | ICD-10-CM

## 2022-11-06 DIAGNOSIS — R109 Unspecified abdominal pain: Secondary | ICD-10-CM

## 2022-11-06 MED ORDER — POLYETHYLENE GLYCOL 3350 17 GM/SCOOP PO POWD: 17.0000 g | Freq: Two times a day (BID) | ORAL | 1 refills | Status: DC | PRN

## 2022-11-06 NOTE — Progress Notes (Signed)
Subjective:  Martin Murphy is a 57 y.o. male who presents for Chief Complaint  Patient presents with   Abdominal Pain    Stomach pain since Saturday. Having a lot of gas, took some prune juice and has made him go some. But still tight and soreness,      Here for abdomina pain, bloating, feels like he has trapped gas, felt like he had to use restroom but couldn't.  Has had some incomplete BMs of late.  Started this past weekend.  Has used some prune juice.  Had some BM the last 3 days.  Today is better than last few days.  Prior to a week ago BMs generally every other day.  No fever, no blood in the stool, no loose stool.  No nausea or vomiting.  No other aggravating or relieving factors.    No other c/o.  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 Reported on 05/25/2015     atorvastatin (LIPITOR) 20 MG tablet Take 1 tablet (20 mg total) by mouth daily. 90 tablet 3   Cholecalciferol (VITAMIN D3 PO) Take by mouth.     hydrochlorothiazide (HYDRODIURIL) 25 MG tablet Take 1 tablet (25 mg total) by mouth daily. 90 tablet 3   ibuprofen (ADVIL) 200 MG tablet Take 400 mg by mouth every 6 (six) hours as needed.     Pyridoxine HCl (B-6 PO) Take by mouth.     sildenafil (VIAGRA) 50 MG tablet Take 50 mg by mouth daily as needed for erectile dysfunction.     tamsulosin (FLOMAX) 0.4 MG CAPS capsule Take 0.4 mg by mouth.     vitamin B-12 (CYANOCOBALAMIN) 1000 MCG tablet Take 1,000 mcg by mouth daily. Reported on 05/25/2015     No  current facility-administered medications on file prior to visit.    The following portions of the patient's history were reviewed and updated as appropriate: allergies, current medications, past family history, past medical history, past social history, past surgical history and problem list.  ROS Otherwise as in subjective above    Objective: BP 120/80   Pulse 63   Temp (!) 97.3 F (36.3 C)   Wt 205 lb 12.8 oz (93.4 kg)   BMI 28.70 kg/m   BP Readings from Last 3 Encounters:  11/06/22 120/80  08/04/22 131/86  06/04/22 124/80   Wt Readings from Last 3 Encounters:  11/06/22 205 lb 12.8 oz (93.4 kg)  09/09/22 211 lb (95.7 kg)  08/04/22 210 lb (95.3 kg)    General appearance: alert, no distress, well developed, well nourished Abdomen: + Somewhat increased bs, soft, mild generalized lower abdominal tenderness, non distended, no masses, no hepatomegaly, no splenomegaly Pulses: 2+ radial pulses, 2+ pedal pulses, normal cap refill Ext: no edema   Assessment: Encounter Diagnoses  Name Primary?   Constipation, unspecified constipation type Yes   Abdominal discomfort    Bloating      Plan: Discussed symptoms and concerns.  I  reviewed his colonoscopy report from June 2024  Symptoms today suggest constipation.  Discussed hydration, acute therapy with some MiraLAX once or twice a day the next few days, and in general counseled on fiber intake, water intake, avoiding foods that make things worse.  If worse or not much improved over the next few days then call back or recheck.  Martin Murphy was seen today for abdominal pain.  Diagnoses and all orders for this visit:  Constipation, unspecified constipation type  Abdominal discomfort  Bloating  Other orders -     polyethylene glycol powder (GLYCOLAX/MIRALAX) 17 GM/SCOOP powder; Take 17 g by mouth 2 (two) times daily as needed.    Follow up: prn

## 2022-11-06 NOTE — Patient Instructions (Signed)
Recommendations: Make sure you are drinking at least 80 ounces of water daily Get at least 25g of fiber in the diet daily such as from cereal, grains such as oatmeal, barley, brown rice, whole grain past, whole grain bread, apples, pears, and green leafy vegetables.  You can consider taking a daily fiber supplement such as Fibercon or Benafiber I want you to use a trial of Miralax over the counter.  Use 1 cap full daily in water or beverage.   Do this for the next month and lets see how you do with this.   If you have 2-3 days without any bowel movement, accompanied by bloating and gas, and discomfort, you can use  1 cap full of Miralax hourly until you have a bowel movement.  Don't use any more than 3 cap fulls in a 24 hour period without calling us   Constipation Constipation is having fewer than 2 bowel movements per week. Usually, the stools are hard. As we grow older, constipation is more common. If you try to fix constipation with laxatives, the problem may get worse. This is because laxatives taken over a long period of time make the colon muscles weaker. A low-fiber diet, not taking in enough fluids, and taking some medicines may make these problems worse. MEDICATIONS THAT MAY CAUSE CONSTIPATION Water pills (diuretics).  Calcium channel blockers (used to control blood pressure and for the heart).  Certain pain medicines (narcotics).  Anticholinergics.  Anti-inflammatory agents.  Antacids that contain aluminum.  DISEASES THAT CONTRIBUTE TO CONSTIPATION Diabetes.  Parkinson's disease.  Dementia.  Stroke.  Depression.  Illnesses that cause problems with salt and water metabolism.  HOME CARE INSTRUCTIONS  Constipation is usually best cared for without medicines. Increasing dietary fiber and eating more fruits and vegetables is the best way to manage constipation.  Slowly increase fiber intake to 25 to 38 grams per day. Whole grains, fruits, vegetables, and legumes are good sources of  fiber. A dietitian can further help you incorporate high-fiber foods into your diet.  Drink enough water and fluids to keep your urine clear or pale yellow.  A fiber supplement may be added to your diet if you cannot get enough fiber from foods.  Increasing your activities also helps improve regularity.  Suppositories, as suggested by your caregiver, will also help. If you are using antacids, such as aluminum or calcium containing products, it will be helpful to switch to products containing magnesium if your caregiver says it is okay.  If you have been given a liquid injection (enema) today, this is only a temporary measure. It should not be relied on for treatment of longstanding (chronic) constipation.  Stronger measures, such as magnesium sulfate, should be avoided if possible. This may cause uncontrollable diarrhea. Using magnesium sulfate may not allow you time to make it to the bathroom.  SEEK IMMEDIATE MEDICAL CARE IF:  There is bright red blood in the stool.  The constipation stays for more than 4 days.  There is belly (abdominal) or rectal pain.  You do not seem to be getting better.  You have any questions or concerns.  MAKE SURE YOU:  Understand these instructions.  Will watch your condition.  Will get help right away if you are not doing well or get worse.  Document Released: 11/16/2003 Document Revised: 10/30/2010 Document Reviewed: 10/06/2007 [facility] Patient Information 2012 [place], [place].

## 2023-01-12 ENCOUNTER — Other Ambulatory Visit: Payer: Self-pay | Admitting: Medical

## 2023-06-08 ENCOUNTER — Encounter: Payer: Self-pay | Admitting: Medical

## 2023-06-08 ENCOUNTER — Ambulatory Visit: Payer: 59 | Admitting: Medical

## 2023-06-08 VITALS — BP 120/80 | HR 94 | Ht 70.0 in | Wt 207.6 lb

## 2023-06-08 DIAGNOSIS — Z0000 Encounter for general adult medical examination without abnormal findings: Secondary | ICD-10-CM | POA: Diagnosis not present

## 2023-06-08 DIAGNOSIS — I10 Essential (primary) hypertension: Secondary | ICD-10-CM | POA: Diagnosis not present

## 2023-06-08 DIAGNOSIS — Z136 Encounter for screening for cardiovascular disorders: Secondary | ICD-10-CM

## 2023-06-08 DIAGNOSIS — R7301 Impaired fasting glucose: Secondary | ICD-10-CM | POA: Diagnosis not present

## 2023-06-08 DIAGNOSIS — J019 Acute sinusitis, unspecified: Secondary | ICD-10-CM | POA: Insufficient documentation

## 2023-06-08 DIAGNOSIS — E785 Hyperlipidemia, unspecified: Secondary | ICD-10-CM

## 2023-06-08 MED ORDER — AMOXICILLIN 875 MG PO TABS: 875.0000 mg | ORAL_TABLET | Freq: Two times a day (BID) | ORAL | 0 refills | Status: AC

## 2023-06-08 NOTE — Progress Notes (Signed)
 Subjective:   HPI  Martin Murphy is a 58 y.o. male who presents for Chief Complaint  Patient presents with   Annual Exam    nonFasting cpe, had sinus infection going on x 1 week    Patient Care Team: Bronwen Pendergraft, Kermit Balo, PA-C as PCP - General (Family Medicine) Lurena Nida, MD as Referring Physician (Rheumatology) Dalton-Bethea, Ashok Croon, MD as Referring Physician (Physical Medicine and Rehabilitation) [facility], [facility] Waymon Budge, MD as Consulting Physician (Pulmonary Disease) Dr. Sande Brothers, urology Dr. Adela Lank, GI  Concerns: He notes 1 week hx/o sinus pressure, persistent yellow mucous, drainage, but no fever, no body ache or chills, no NVD.  Compliant with BP medication.   Compliant with lipid medication without c/o.  Sees urology for hx/o prostate cancer, in remission, on flomase for BPH symptoms  Reviewed their medical, surgical, family, social, medication, and allergy history and updated chart as appropriate.  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 Reported on 05/25/2015, Disp: , Rfl:    atorvastatin (LIPITOR) 20 MG tablet, TAKE 1 TABLET BY MOUTH EVERY DAY, Disp: 90 tablet, Rfl: 1   Cholecalciferol (VITAMIN D3 PO), Take by mouth., Disp: , Rfl:    hydrochlorothiazide (HYDRODIURIL) 25 MG tablet, Take 1 tablet (25 mg total) by mouth daily., Disp: 90 tablet, Rfl: 3    Pyridoxine HCl (B-6 PO), Take by mouth., Disp: , Rfl:    sildenafil (VIAGRA) 50 MG tablet, Take 50 mg by mouth daily as needed for erectile dysfunction., Disp: , Rfl:    tamsulosin (FLOMAX) 0.4 MG CAPS capsule, Take 0.4 mg by mouth., Disp: , Rfl:    vitamin B-12 (CYANOCOBALAMIN) 1000 MCG tablet, Take 1,000 mcg by mouth daily. Reported on 05/25/2015, Disp: , Rfl:   Family History  Problem Relation Age of Onset   Hypertension Brother    Diabetes Brother    Cancer Maternal Grandfather        prostate   Prostate cancer Maternal Uncle        1 had surgery, 1 had seeds   Colon cancer Neg Hx    Esophageal cancer Neg Hx    Stomach cancer Neg Hx    Rectal cancer Neg Hx    Hyperlipidemia Neg Hx    CVA Neg Hx    Heart attack Neg Hx     Past Surgical History:  Procedure Laterality Date   BRONCHOSCOPY  1990's   w/  biopsy's   BUNIONECTOMY     FOOT SURGERY     3 surgeries total   INGUINAL HERNIA REPAIR Bilateral right  02-09-2012/   left  1990's   PROSTATE BIOPSY  12/20/12   gleason 6   PROSTATE BIOPSY  06/07/13   gleason 6, vol 36 gms   RADIOACTIVE SEED IMPLANT N/A 10/20/2013   Procedure:  RADIOACTIVE SEED IMPLANT;  Surgeon: Valetta Fuller, MD;  Location: [facility];  Service: Urology;  Laterality: N/A;    Review of Systems  Constitutional:  Negative for chills, fever, malaise/fatigue and weight loss.  HENT:  Positive for congestion and sinus pain. Negative for ear pain, hearing loss, sore throat and tinnitus.   Eyes:  Negative for blurred vision, pain and redness.  Respiratory:  Negative for cough, hemoptysis and shortness of breath.   Cardiovascular:  Negative for chest pain, palpitations, orthopnea, claudication and leg swelling.  Gastrointestinal:  Negative for abdominal pain, blood in stool, constipation, diarrhea, nausea and vomiting.  Genitourinary:  Negative for dysuria, flank pain, frequency, hematuria and urgency.  Musculoskeletal:  Negative for falls, joint pain  and myalgias.  Skin:  Negative for itching and rash.  Neurological:  Negative for dizziness, tingling, speech change, weakness and headaches.  Endo/Heme/Allergies:  Negative for polydipsia. Does not bruise/bleed easily.  Psychiatric/Behavioral:  Negative for depression and memory loss. The patient is not nervous/anxious and does not have insomnia.        Objective:  BP 120/80   Pulse 94   Ht 5\' 10"  (1.778 m)   Wt 207 lb 9.6 oz (94.2 kg)   BMI 29.79 kg/m   General appearance: alert, no distress, WD/WN, African American male Skin: unremarkable HEENT: normocephalic, conjunctiva/corneas normal, sclerae anicteric, PERRLA, EOMi, nares patent, mild mucoid discharge, mild erythema, pharynx normal Oral cavity: MMM, tongue normal, teeth in good repair Neck: supple, no lymphadenopathy, no thyromegaly, no masses, normal ROM, no bruits Chest: non tender, normal shape and expansion Heart: RRR, normal S1, S2, no murmurs Lungs: CTA bilaterally, no wheezes, rhonchi, or rales Abdomen: +bs, soft,  non tender, non distended, no masses, no hepatomegaly, no splenomegaly, no bruits Back: non tender, normal ROM, no scoliosis Musculoskeletal: upper extremities non tender, no obvious deformity, normal ROM throughout, lower extremities non tender, no obvious deformity, normal ROM throughout Extremities: no edema, no cyanosis, no clubbing Pulses: 2+ symmetric, upper and lower extremities, normal cap refill Neurological: alert, oriented x 3, CN2-12 intact, strength normal upper extremities and lower extremities, sensation normal throughout, DTRs 2+ throughout, no cerebellar signs, gait normal Psychiatric: normal affect, behavior normal, pleasant  GU/rectal -deferred to urology    Assessment and Plan :   Encounter Diagnoses  Name Primary?   Encounter for health maintenance examination in adult Yes   Primary hypertension    Impaired fasting blood sugar    Screening for heart disease    Acute sinusitis,  recurrence not specified, unspecified location    Hyperlipidemia, unspecified hyperlipidemia type     This visit was a preventative care visit, also known as wellness visit or routine physical.   Topics typically include healthy lifestyle, diet, exercise, preventative care, vaccinations, sick and well care, proper use of emergency dept and after hours care, as well as other concerns.     Separate significant issues discussed: Hypertension-continue current medication, Hydrochlorothiazide 25mg  daily  Hyperlipidemia-continue current medication Lipitor 20mg  daily, labs today  ED - does fine on Viagra  Hx/o prostate cancer - managed by urology  Sinusitis - begin good hydration, nasal saline, mucinex OTC.  If not improving in the next 3-4 days, can begin amoxicillin    General Recommendations: Continue to return yearly for your annual wellness and preventative care visits.  This gives [place] a chance to discuss healthy lifestyle, exercise, vaccinations, review your chart record, and perform screenings where appropriate.  I recommend you see your eye  doctor yearly for routine vision care.  I recommend you see your dentist yearly for routine dental care including hygiene visits twice yearly.   Vaccination  Immunization History  Administered Date(s) Administered   PFIZER(Purple Top)SARS-COV-2 Vaccination 05/12/2019, 06/02/2019, 03/10/2020   Tdap 10/27/2014   Declines Shingrix He notes having shingles 2 years ago  Screening for cancer: Colon cancer screening: I reviewed your 08/2022 colonoscopy report   History of prostate cancer - continue routine follow up urology   Skin cancer screening: Check your skin regularly for new changes, growing lesions, or other lesions of concern Come in for evaluation if you have skin lesions of concern.   Lung cancer screening: If you have a greater than 20 pack year history of tobacco use, then you may qualify for lung cancer screening with a chest  CT scan.   Please call your insurance company to inquire about coverage for this test.   Pancreatic cancer:  no current screening test is available or routinely recommended. (risk factors: smoking, overweight or obese, diabetes, chronic pancreatitis, work exposure - dry cleaning, metal working, 60yo>, M>F, Tree surgeon, family hx/o, hereditary breast, ovarian, melanoma, lynch, peutz-jeghers).  Symptoms: jaundice, dark urine, light color or greasy stools, itchy skin, belly or back pain, weight loss, poor appetite, nausea, vomiting, liver enlargement, DVT/blood clots.   We currently don't have screenings for other cancers besides breast, cervical, colon, and lung cancers.  If you have a strong family history of cancer or have other cancer screening concerns, please let me know.  Genetic testing referral is an option for individuals with high cancer risk in the family.  There are some other cancer screenings in development currently.   Bone health: Get at least 150 minutes of aerobic exercise weekly Get weight bearing exercise at least once weekly Bone density test:  A bone density test is an imaging test that uses a type of X-ray to measure the amount of calcium and other minerals in your bones. The test may be used to diagnose or screen you for a condition that causes weak or thin bones (osteoporosis), predict your risk for a broken bone (fracture), or determine how well your osteoporosis treatment is working. The bone density test is recommended for females 65 and older, or females or males <65 if certain risk factors such as thyroid disease, long term use of steroids such as for asthma or rheumatological issues, vitamin D deficiency, estrogen deficiency, family history of osteoporosis, self or family history of fragility fracture in first degree relative.    Heart health: Get at least 150 minutes of aerobic exercise weekly Limit alcohol It is important to maintain a healthy blood pressure  and healthy cholesterol numbers  Heart disease screening: Screening for heart disease includes screening for blood pressure, fasting lipids, glucose/diabetes screening, BMI height to weight ratio, reviewed of smoking status, physical activity, and diet.    Goals include blood pressure 120/80 or less, maintaining a healthy lipid/cholesterol profile, preventing diabetes or keeping diabetes numbers under good control, not smoking or using tobacco products, exercising most days per week or at least 150 minutes per week of exercise, and eating healthy variety of fruits and vegetables, healthy oils, and avoiding unhealthy food choices like fried food, fast food, high sugar and high cholesterol foods.    Other tests may possibly include EKG test, CT coronary calcium score, echocardiogram, exercise treadmill stress test.      Vascular disease screening: For higher risk individuals including smokers, diabetics, patients with known heart  disease or high blood pressure, kidney disease, and others, screening for vascular disease or atherosclerosis of the arteries is available.  Examples may include carotid ultrasound, abdominal aortic ultrasound, ABI blood flow screening in the legs, thoracic aorta screening.   Medical care options: I recommend you continue to seek care here first for routine care.  We try really hard to have available appointments Monday through Friday daytime hours for sick visits, acute visits, and physicals.  Urgent care should be used for after hours and weekends for significant issues that cannot wait till the next day.  The emergency department should be used for significant potentially life-threatening emergencies.  The emergency department is expensive, can often have long wait times for less significant concerns, so try to utilize primary care, urgent care, or telemedicine when possible to avoid unnecessary trips to the emergency department.  Virtual visits and telemedicine have been  introduced since the pandemic started in 2020, and can be convenient ways to receive medical care.  We offer virtual appointments as well to assist you in a variety of options to seek medical care.   Legal  Take the time to do a last will and testament, Advanced Directives including Health Care Power of Attorney and Living Will documents.  Don't leave your family with burdens that can be handled ahead of time.   Advanced Directives: I recommend you consider completing a Health Care Power of Attorney and Living Will.   These documents respect your wishes and help alleviate burdens on your loved ones if you were to become terminally ill or be in a position to need those documents enforced.    You can complete Advanced Directives yourself, have them notarized, then have copies made for our office, for you and for anybody you feel should have them in safe keeping.  Or, you can have an attorney prepare these documents.   If you haven't updated your Last Will and Testament in a while, it may be worthwhile having an attorney prepare these documents together and save on some costs.       Spiritual and Emotional Health Keeping a healthy spiritual life can help you better manage your physical health. Your spiritual life can help you to cope with any issues that may arise with your physical health.  Balance can keep [place] healthy and help [place] to recover.  If you are struggling with your spiritual health there are questions that you may want to ask yourself:  What makes me feel most complete? When do I feel most connected to the rest of the world? Where do I find the most inner strength? What am I doing when I feel whole?  Helpful tips: Being in nature. Some people feel very connected and at peace when they are walking outdoors or are outside. Helping others. Some feel the largest sense of wellbeing when they are of service to others. Being of service can take on many forms. It can be doing volunteer  work, being kind to strangers, or offering a hand to a friend in need. Gratitude. Some people find they feel the most connected when they remain grateful. They may make lists of all the things they are grateful for or say a thank you out loud for all they have.    Emotional Health Are you in tune with your emotional health?  Check out this link: http://www.marquez-love.com/    Financial Health Make sure you use a budget for your personal finances Make sure you are insured against risks (  health insurance, life insurance, auto insurance, etc) Save more, spend less Set financial goals If you need help in this area, good resources include counseling through [employer] or other community resources, have a meeting with a Social research officer, government, and a good resource is the [employer]    Martin Murphy was seen today for annual exam.  Diagnoses and all orders for this visit:  Encounter for health maintenance examination in adult -     Comprehensive metabolic panel with GFR; Future -     CBC with Differential/Platelet; Future -     Lipid panel; Future -     TSH; Future -     Hemoglobin A1c; Future -     Urinalysis, Routine w reflex microscopic; Future -     CT CARDIAC SCORING (SELF PAY ONLY); Future  Primary hypertension -     CT CARDIAC SCORING (SELF PAY ONLY); Future  Impaired fasting blood sugar -     Hemoglobin A1c; Future  Screening for heart disease -     CT CARDIAC SCORING (SELF PAY ONLY); Future  Acute sinusitis, recurrence not specified, unspecified location  Hyperlipidemia, unspecified hyperlipidemia type  Other orders -     amoxicillin (AMOXIL) 875 MG tablet; Take 1 tablet (875 mg total) by mouth 2 (two) times daily for 10 days.     Follow-up pending labs, yearly for physical

## 2023-06-10 ENCOUNTER — Other Ambulatory Visit

## 2023-06-18 ENCOUNTER — Ambulatory Visit (HOSPITAL_COMMUNITY)
Admission: RE | Admit: 2023-06-18 | Discharge: 2023-06-18 | Disposition: A | Discharge status: Home / Self Care | Payer: Self-pay | Source: Ambulatory Visit | Attending: Medical | Admitting: Medical

## 2023-06-18 DIAGNOSIS — Z136 Encounter for screening for cardiovascular disorders: Secondary | ICD-10-CM | POA: Insufficient documentation

## 2023-06-18 DIAGNOSIS — Z0000 Encounter for general adult medical examination without abnormal findings: Secondary | ICD-10-CM | POA: Insufficient documentation

## 2023-06-18 DIAGNOSIS — I10 Essential (primary) hypertension: Secondary | ICD-10-CM | POA: Insufficient documentation

## 2023-06-21 NOTE — Progress Notes (Signed)
 Results sent through MyChart

## 2023-07-06 ENCOUNTER — Other Ambulatory Visit: Payer: Self-pay | Admitting: Medical

## 2023-07-06 MED ORDER — ATORVASTATIN CALCIUM 20 MG PO TABS: 20.0000 mg | ORAL_TABLET | Freq: Every day | ORAL | 3 refills | Status: AC

## 2023-07-06 MED ORDER — HYDROCHLOROTHIAZIDE 25 MG PO TABS: 25.0000 mg | ORAL_TABLET | Freq: Every day | ORAL | 3 refills | Status: AC

## 2023-07-06 NOTE — Telephone Encounter (Signed)
 Copied from CRM [id number]. Topic: Clinical - Medication Refill >> Jul 06, 2023  1:41 PM Baldomero Bone wrote: Most Recent Primary Care Visit:  Provider: Claudene Crystal  Department: [facility]  Visit Type: PHYSICAL 45  Date: 06/08/2023  Medication: hydrochlorothiazide  (HYDRODIURIL ) 25 MG tablet atorvastatin  (LIPITOR) 20 MG tablet  Has the patient contacted their pharmacy? No (Agent: If no, request that the patient contact the pharmacy for the refill. If patient does not wish to contact the pharmacy document the reason why and proceed with request.) (Agent: If yes, when and what did the pharmacy advise?)  Is this the correct pharmacy for this prescription? Yes If no, delete pharmacy and type the correct one.  This is the patient's preferred pharmacy:  [facility] [phone] - [place], [place] - [address]  [place] [address] Phone: [phone] Fax: [phone]   Has the prescription been filled recently? No  Is the patient out of the medication? Yes  Has the patient been seen for an appointment in the last year OR does the patient have an upcoming appointment? Yes  Can we respond through MyChart? Yes  Agent: Please be advised that Rx refills may take up to 3 business days. We ask that you follow-up with your pharmacy.

## 2023-07-06 NOTE — Telephone Encounter (Signed)
 Last Fill: Hydrochlorothiazide : 11/27/21     Atorvastatin : 01/12/23  Last OV: 06/08/23 Next OV: 06/20/24  Routing to provider for review/authorization.

## 2023-07-07 ENCOUNTER — Other Ambulatory Visit

## 2023-07-07 DIAGNOSIS — R7301 Impaired fasting glucose: Secondary | ICD-10-CM

## 2023-07-07 DIAGNOSIS — Z0000 Encounter for general adult medical examination without abnormal findings: Secondary | ICD-10-CM

## 2023-07-07 LAB — URINALYSIS, ROUTINE W REFLEX MICROSCOPIC
Bilirubin, UA: NEGATIVE
Glucose, UA: NEGATIVE
Ketones, UA: NEGATIVE
Leukocytes,UA: NEGATIVE
Nitrite, UA: NEGATIVE
Protein,UA: NEGATIVE
RBC, UA: NEGATIVE
Specific Gravity, UA: 1.023 (ref 1.005–1.030)
Urobilinogen, Ur: 0.2 mg/dL (ref 0.2–1.0)
pH, UA: 5.5 (ref 5.0–7.5)

## 2023-07-08 ENCOUNTER — Other Ambulatory Visit: Payer: Self-pay | Admitting: Medical

## 2023-07-08 LAB — TSH: TSH: 1.72 u[IU]/mL (ref 0.450–4.500)

## 2023-07-08 LAB — CBC WITH DIFFERENTIAL/PLATELET
Basophils Absolute: 0 10*3/uL (ref 0.0–0.2)
Basos: 0 %
EOS (ABSOLUTE): 0.1 10*3/uL (ref 0.0–0.4)
Eos: 3 %
Hematocrit: 39.8 % (ref 37.5–51.0)
Hemoglobin: 12.9 g/dL — ABNORMAL LOW (ref 13.0–17.7)
Immature Grans (Abs): 0 10*3/uL (ref 0.0–0.1)
Immature Granulocytes: 0 %
Lymphocytes Absolute: 1.6 10*3/uL (ref 0.7–3.1)
Lymphs: 34 %
MCH: 28.4 pg (ref 26.6–33.0)
MCHC: 32.4 g/dL (ref 31.5–35.7)
MCV: 88 fL (ref 79–97)
Monocytes Absolute: 0.4 10*3/uL (ref 0.1–0.9)
Monocytes: 8 %
Neutrophils Absolute: 2.5 10*3/uL (ref 1.4–7.0)
Neutrophils: 55 %
Platelets: 197 10*3/uL (ref 150–450)
RBC: 4.54 x10E6/uL (ref 4.14–5.80)
RDW: 13.4 % (ref 11.6–15.4)
WBC: 4.6 10*3/uL (ref 3.4–10.8)

## 2023-07-08 LAB — COMPREHENSIVE METABOLIC PANEL WITH GFR
ALT: 25 IU/L (ref 0–44)
AST: 24 IU/L (ref 0–40)
Albumin: 4.1 g/dL (ref 3.8–4.9)
Alkaline Phosphatase: 97 IU/L (ref 44–121)
BUN/Creatinine Ratio: 14 (ref 9–20)
BUN: 15 mg/dL (ref 6–24)
Bilirubin Total: 0.5 mg/dL (ref 0.0–1.2)
CO2: 22 mmol/L (ref 20–29)
Calcium: 9.4 mg/dL (ref 8.7–10.2)
Chloride: 103 mmol/L (ref 96–106)
Creatinine, Ser: 1.05 mg/dL (ref 0.76–1.27)
Globulin, Total: 2.9 g/dL (ref 1.5–4.5)
Glucose: 84 mg/dL (ref 70–99)
Potassium: 4.7 mmol/L (ref 3.5–5.2)
Sodium: 140 mmol/L (ref 134–144)
Total Protein: 7 g/dL (ref 6.0–8.5)
eGFR: 82 mL/min/{1.73_m2} (ref >59)

## 2023-07-08 LAB — HEMOGLOBIN A1C
Est. average glucose Bld gHb Est-mCnc: 128 mg/dL
Hgb A1c MFr Bld: 6.1 % — ABNORMAL HIGH (ref 4.8–5.6)

## 2023-07-08 LAB — LIPID PANEL
Chol/HDL Ratio: 3.3 ratio (ref 0.0–5.0)
Cholesterol, Total: 165 mg/dL (ref 100–199)
HDL: 50 mg/dL (ref >39)
LDL Chol Calc (NIH): 101 mg/dL — ABNORMAL HIGH (ref 0–99)
Triglycerides: 76 mg/dL (ref 0–149)
VLDL Cholesterol Cal: 14 mg/dL (ref 5–40)

## 2023-07-08 NOTE — Progress Notes (Signed)
 Results sent through MyChart

## 2023-07-14 ENCOUNTER — Ambulatory Visit: Payer: Self-pay | Admitting: Internal Medicine

## 2023-09-21 ENCOUNTER — Ambulatory Visit: Admitting: Medical

## 2023-09-21 VITALS — BP 120/70 | HR 68 | Wt 206.4 lb

## 2023-09-21 DIAGNOSIS — N5082 Scrotal pain: Secondary | ICD-10-CM

## 2023-09-21 DIAGNOSIS — R1031 Right lower quadrant pain: Secondary | ICD-10-CM | POA: Diagnosis not present

## 2023-09-21 DIAGNOSIS — K5900 Constipation, unspecified: Secondary | ICD-10-CM

## 2023-09-21 DIAGNOSIS — K429 Umbilical hernia without obstruction or gangrene: Secondary | ICD-10-CM

## 2023-09-21 DIAGNOSIS — Z8719 Personal history of other diseases of the digestive system: Secondary | ICD-10-CM

## 2023-09-21 DIAGNOSIS — Z9889 Other specified postprocedural states: Secondary | ICD-10-CM

## 2023-09-21 DIAGNOSIS — M6208 Separation of muscle (nontraumatic), other site: Secondary | ICD-10-CM

## 2023-09-21 MED ORDER — SULFAMETHOXAZOLE-TRIMETHOPRIM 800-160 MG PO TABS: 1.0000 | ORAL_TABLET | Freq: Two times a day (BID) | ORAL | 0 refills | Status: AC

## 2023-09-21 NOTE — Progress Notes (Signed)
 Subjective:  Martin Murphy is a 58 y.o. male who presents for Chief Complaint  Patient presents with   Acute Visit    Abdominal hernia x a couple weeks. No pain,      Here for abdominal pain, constipation, scrotal discomfort.    For a weeks something doesn't feel right in right lower abdomen/testicle.   Wonders about hernia.  No obvious bulging in that area.   He does have a history of left inguinal hernia repair and longstanding umbilical hernia.  No fever, no dysuria, no odor in urine, no blood in urine, no swelling of scrotum or penis.   No nausea or vomiting.  Has had some constipation recently.  Has some miralax  at home that he uses periodically.  He has hx/o prostate cancer, prior treatment with EBRT brachytherapy 2015, hx/o BPH as well  He is also using probiotic regularly.  No hx/o orchitis.   He is taking Flomax regularly for BPH.  No other aggravating or relieving factors.    No other c/o.  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 Reported on 05/25/2015     atorvastatin  (LIPITOR) 20 MG tablet Take 1 tablet (20 mg total) by mouth daily. 90 tablet 3   Cholecalciferol (VITAMIN D3 PO) Take by mouth.     hydrochlorothiazide  (HYDRODIURIL ) 25 MG tablet Take 1 tablet (25 mg total) by mouth daily. 90 tablet 3   Pyridoxine HCl (B-6 PO) Take by mouth.     sildenafil  (VIAGRA ) 50 MG tablet Take 50 mg by mouth daily as needed for erectile dysfunction.     tamsulosin (FLOMAX) 0.4 MG CAPS capsule Take 0.4 mg by mouth.      vitamin B-12 (CYANOCOBALAMIN) 1000 MCG tablet Take 1,000 mcg by mouth daily. Reported on 05/25/2015     No current facility-administered medications on file prior to visit.    The following portions of the patient's history were reviewed and updated as appropriate: allergies, current medications, past family history, past medical history, past social history, past surgical history and problem list.  ROS Otherwise as in subjective above    Objective: BP 120/70   Pulse 68   Wt 206 lb 6.4 oz (93.6 kg)   SpO2 97%   BMI 29.62 kg/m   General appearance: alert, no distress, well developed, well nourished Abdomen: +bs, soft, mild right lower quadrant tenderness, reducible umbilical hernia present and diastases recti also present.  Non tender, non distended, no masses, no hepatomegaly, no splenomegaly Pulses: 2+ radial pulses, 2+ pedal pulses, normal cap refill Ext: no edema GU: No obvious tenderness of the testicles or scrotum, there is spermatoceles noted in both left and right scrotum but no large mass, no obvious hernia on exam today, penis circumcised, unremarkable.   Assessment: Encounter Diagnoses  Name Primary?   Constipation, unspecified constipation type Yes   Scrotal pain    Right lower quadrant abdominal pain    S/P hernia repair  Umbilical hernia without obstruction and without gangrene    Diastasis recti      Plan: We discussed symptoms and concerns.  Constipation-try to drink at least 80 to 100 ounces of water  daily, get fiber in the diet daily, use MiraLAX  1 capful daily as needed.  If you go 3 to 4 days without any bowel movement either use some prune juice or do MiraLAX  hourly for couple hours until you get relief.  Scrotal pain-you are not tender on exam today.  There is some spermatoceles noted in the scrotum but no other obvious mass or worrisome finding.  I cannot feel a true hernia in the inguinal area today.  Try some over-the-counter Aleve or ibuprofen  this week daily.  If you do not see any improvements over the next 4 to 5 days, or if worse discomfort of the testicle or scrotum in the next 4 to 5 days then begin Bactrim  antibiotic  Right lower quadrant pain-possibly related to constipation or even orchitis/testicle infection.  As mentioned above, use ibuprofen or Aleve for the next few days and add the antibiotic if symptoms or not improving  If you decide you want to pursue an ultrasound of the scrotum to help further evaluate, I can set you up for this  You have surgical scars in your left inguinal area from prior hernia surgery  Diastases recti-abdominal bulge, not a true hernia.  There is nothing to do about this.  This does not require treatment other than work on some regular exercise, abdominal crunches and other strengthening exercises to keep your muscles in good tone  You have a longstanding bellybutton or umbilical hernia that is not problematic today.  History of prostate cancer-in remission.  I reviewed your November 2024 urology notes  Martin Murphy was seen today for acute visit.  Diagnoses and all orders for this visit:  Constipation, unspecified constipation type  Scrotal pain  Right lower quadrant abdominal pain  S/P hernia repair  Umbilical hernia without obstruction and without gangrene  Diastasis recti  Other orders -     sulfamethoxazole -trimethoprim  (BACTRIM  DS) 800-160 MG tablet; Take 1 tablet by mouth 2 (two) times daily.    Follow up: pending call back

## 2023-09-21 NOTE — Patient Instructions (Signed)
 We discussed symptoms and concerns.  Constipation-try to drink at least 80 to 100 ounces of water  daily, get fiber in the diet daily, use MiraLAX  1 capful daily as needed.  If you go 3 to 4 days without any bowel movement either use some prune juice or do MiraLAX  hourly for couple hours until you get relief.  Scrotal pain-you are not tender on exam today.  There is some spermatoceles noted in the scrotum but no other obvious mass or worrisome finding.  I cannot feel a true hernia in the inguinal area today.  Try some over-the-counter Aleve or ibuprofen this week daily.  If you do not see any improvements over the next 4 to 5 days, or if worse discomfort of the testicle or scrotum in the next 4 to 5 days then begin Bactrim  antibiotic  Right lower quadrant pain-possibly related to constipation or even orchitis/testicle infection.  As mentioned above, use ibuprofen or Aleve for the next few days and add the antibiotic if symptoms or not improving  If you decide you want to pursue an ultrasound of the scrotum to help further evaluate, I can set you up for this  You have surgical scars in your left inguinal area from prior hernia surgery  Diastases recti-abdominal bulge, not a true hernia.  There is nothing to do about this.  This does not require treatment other than work on some regular exercise, abdominal crunches and other strengthening exercises to keep your muscles in good tone  You have a longstanding bellybutton or umbilical hernia that is not problematic today.  History of prostate cancer-in remission.  I reviewed your November 2024 urology notes

## 2023-09-29 NOTE — Procedures (Signed)
Result scanned to media

## 2024-06-20 ENCOUNTER — Encounter: Payer: Self-pay | Admitting: Medical
